# Patient Record
Sex: Female | Born: 1935
Health system: Southern US, Community
[De-identification: ages and names within clinical notes are randomized; demographics above are authoritative.]

## PROBLEM LIST (undated history)

## (undated) DIAGNOSIS — I10 Essential (primary) hypertension: Secondary | ICD-10-CM

## (undated) DIAGNOSIS — K219 Gastro-esophageal reflux disease without esophagitis: Secondary | ICD-10-CM

## (undated) DIAGNOSIS — Z923 Personal history of irradiation: Secondary | ICD-10-CM

## (undated) DIAGNOSIS — E785 Hyperlipidemia, unspecified: Secondary | ICD-10-CM

## (undated) DIAGNOSIS — R937 Abnormal findings on diagnostic imaging of other parts of musculoskeletal system: Secondary | ICD-10-CM

## (undated) DIAGNOSIS — Z853 Personal history of malignant neoplasm of breast: Secondary | ICD-10-CM

## (undated) DIAGNOSIS — R0600 Dyspnea, unspecified: Secondary | ICD-10-CM

## (undated) HISTORY — PX: CATARACT EXTRACTION: SUR2

## (undated) HISTORY — DX: Hyperlipidemia, unspecified: E78.5

## (undated) HISTORY — DX: Essential (primary) hypertension: I10

## (undated) HISTORY — PX: ABDOMINAL HYSTERECTOMY: SHX81

## (undated) HISTORY — PX: OOPHORECTOMY: SHX86

## (undated) HISTORY — DX: Gastro-esophageal reflux disease without esophagitis: K21.9

## (undated) HISTORY — DX: Abnormal findings on diagnostic imaging of other parts of musculoskeletal system: R93.7

## (undated) HISTORY — DX: Personal history of malignant neoplasm of breast: Z85.3

## (undated) HISTORY — PX: BREAST SURGERY: SHX581

---

## 1998-10-18 ENCOUNTER — Ambulatory Visit (HOSPITAL_COMMUNITY): Admission: RE | Admit: 1998-10-18 | Discharge: 1998-10-18 | Payer: Self-pay | Admitting: Emergency Medicine

## 1999-08-31 ENCOUNTER — Ambulatory Visit: Admission: RE | Admit: 1999-08-31 | Discharge: 1999-08-31 | Payer: Self-pay | Admitting: Family Medicine

## 1999-09-01 ENCOUNTER — Ambulatory Visit (HOSPITAL_COMMUNITY): Admission: RE | Admit: 1999-09-01 | Discharge: 1999-09-01 | Payer: Self-pay | Admitting: Family Medicine

## 1999-09-01 ENCOUNTER — Encounter: Payer: Self-pay | Admitting: Family Medicine

## 2003-02-02 ENCOUNTER — Ambulatory Visit (HOSPITAL_COMMUNITY): Admission: RE | Admit: 2003-02-02 | Discharge: 2003-02-02 | Payer: Self-pay | Admitting: Emergency Medicine

## 2005-02-02 ENCOUNTER — Ambulatory Visit (HOSPITAL_COMMUNITY): Admission: RE | Admit: 2005-02-02 | Discharge: 2005-02-02 | Payer: Self-pay | Admitting: *Deleted

## 2005-06-28 ENCOUNTER — Ambulatory Visit: Payer: Self-pay | Admitting: Internal Medicine

## 2005-09-17 ENCOUNTER — Ambulatory Visit: Payer: Self-pay | Admitting: Internal Medicine

## 2006-02-19 ENCOUNTER — Ambulatory Visit: Payer: Self-pay | Admitting: Internal Medicine

## 2006-07-22 ENCOUNTER — Ambulatory Visit: Payer: Self-pay | Admitting: Internal Medicine

## 2006-09-17 DIAGNOSIS — I10 Essential (primary) hypertension: Secondary | ICD-10-CM | POA: Insufficient documentation

## 2006-09-17 DIAGNOSIS — E785 Hyperlipidemia, unspecified: Secondary | ICD-10-CM

## 2006-11-19 ENCOUNTER — Ambulatory Visit: Payer: Self-pay | Admitting: Internal Medicine

## 2006-11-19 ENCOUNTER — Encounter: Payer: Self-pay | Admitting: Internal Medicine

## 2006-11-19 DIAGNOSIS — M2550 Pain in unspecified joint: Secondary | ICD-10-CM

## 2006-11-19 LAB — CONVERTED CEMR LAB
ALT: 12 units/L (ref 0–40)
BUN: 12 mg/dL (ref 6–23)
Basophils Relative: 1.2 % — ABNORMAL HIGH (ref 0.0–1.0)
Bilirubin, Direct: 0.1 mg/dL (ref 0.0–0.3)
Calcium: 8.9 mg/dL (ref 8.4–10.5)
Chloride: 107 meq/L (ref 96–112)
Cholesterol: 189 mg/dL (ref 0–200)
Creatinine, Ser: 0.8 mg/dL (ref 0.4–1.2)
Eosinophils Relative: 4.7 % (ref 0.0–5.0)
HCT: 39.6 % (ref 36.0–46.0)
HDL: 50.3 mg/dL (ref >39.0)
MCHC: 34 g/dL (ref 30.0–36.0)
MCV: 84.3 fL (ref 78.0–100.0)
Monocytes Absolute: 0.4 10*3/uL (ref 0.2–0.7)
Monocytes Relative: 6.7 % (ref 3.0–11.0)
Neutrophils Relative %: 65 % (ref 43.0–77.0)
Potassium: 3.6 meq/L (ref 3.5–5.1)
RDW: 12.9 % (ref 11.5–14.6)
Total Bilirubin: 0.9 mg/dL (ref 0.3–1.2)
Total CHOL/HDL Ratio: 3.8
Total Protein: 6.9 g/dL (ref 6.0–8.3)
Triglycerides: 93 mg/dL (ref 0–149)

## 2007-03-12 ENCOUNTER — Emergency Department (HOSPITAL_COMMUNITY): Admission: EM | Admit: 2007-03-12 | Discharge: 2007-03-12 | Payer: Self-pay | Admitting: *Deleted

## 2007-05-21 ENCOUNTER — Ambulatory Visit: Payer: Self-pay | Admitting: Internal Medicine

## 2007-09-23 ENCOUNTER — Ambulatory Visit: Payer: Self-pay | Admitting: Internal Medicine

## 2007-09-23 DIAGNOSIS — K219 Gastro-esophageal reflux disease without esophagitis: Secondary | ICD-10-CM | POA: Insufficient documentation

## 2007-10-07 ENCOUNTER — Telehealth: Payer: Self-pay | Admitting: Internal Medicine

## 2007-12-31 ENCOUNTER — Ambulatory Visit: Payer: Self-pay | Admitting: Internal Medicine

## 2008-01-01 LAB — CONVERTED CEMR LAB
Albumin: 4 g/dL (ref 3.5–5.2)
Alkaline Phosphatase: 71 units/L (ref 39–117)
CO2: 29 meq/L (ref 19–32)
Chloride: 104 meq/L (ref 96–112)
Cholesterol: 223 mg/dL (ref 0–200)
Direct LDL: 156.5 mg/dL
GFR calc Af Amer: 71 mL/min
GFR calc non Af Amer: 58 mL/min
Glucose, Bld: 112 mg/dL — ABNORMAL HIGH (ref 70–99)
Hemoglobin: 13.4 g/dL (ref 12.0–15.0)
Lymphocytes Relative: 23.9 % (ref 12.0–46.0)
MCHC: 33.4 g/dL (ref 30.0–36.0)
MCV: 84.8 fL (ref 78.0–100.0)
Monocytes Absolute: 0.5 10*3/uL (ref 0.1–1.0)
Neutrophils Relative %: 62.9 % (ref 43.0–77.0)
Platelets: 338 10*3/uL (ref 150–400)
Potassium: 3.7 meq/L (ref 3.5–5.1)
RBC: 4.74 M/uL (ref 3.87–5.11)
Sodium: 138 meq/L (ref 135–145)
Total CHOL/HDL Ratio: 4.9
Total Protein: 7.2 g/dL (ref 6.0–8.3)
Triglycerides: 97 mg/dL (ref 0–149)
WBC: 6.4 10*3/uL (ref 4.5–10.5)

## 2008-01-06 ENCOUNTER — Encounter: Admission: RE | Admit: 2008-01-06 | Discharge: 2008-01-06 | Payer: Self-pay | Admitting: Internal Medicine

## 2008-01-06 ENCOUNTER — Telehealth (INDEPENDENT_AMBULATORY_CARE_PROVIDER_SITE_OTHER): Payer: Self-pay | Admitting: *Deleted

## 2008-01-07 ENCOUNTER — Encounter (INDEPENDENT_AMBULATORY_CARE_PROVIDER_SITE_OTHER): Payer: Self-pay | Admitting: *Deleted

## 2008-01-19 ENCOUNTER — Ambulatory Visit: Payer: Self-pay | Admitting: Internal Medicine

## 2008-03-13 HISTORY — PX: CHOLECYSTECTOMY: SHX55

## 2008-04-07 ENCOUNTER — Ambulatory Visit (HOSPITAL_COMMUNITY): Admission: RE | Admit: 2008-04-07 | Discharge: 2008-04-07 | Payer: Self-pay | Admitting: *Deleted

## 2008-04-07 ENCOUNTER — Encounter (INDEPENDENT_AMBULATORY_CARE_PROVIDER_SITE_OTHER): Payer: Self-pay | Admitting: *Deleted

## 2008-05-05 ENCOUNTER — Ambulatory Visit: Payer: Self-pay | Admitting: Internal Medicine

## 2008-05-06 ENCOUNTER — Telehealth (INDEPENDENT_AMBULATORY_CARE_PROVIDER_SITE_OTHER): Payer: Self-pay | Admitting: *Deleted

## 2008-05-06 LAB — CONVERTED CEMR LAB
Cholesterol: 198 mg/dL (ref 0–200)
HDL: 39.6 mg/dL (ref >39.0)
Hgb A1c MFr Bld: 5.6 % (ref 4.6–6.0)
Triglycerides: 94 mg/dL (ref 0–149)
Vit D, 1,25-Dihydroxy: 63 (ref 30–89)

## 2008-05-07 ENCOUNTER — Encounter: Payer: Self-pay | Admitting: Internal Medicine

## 2008-05-19 ENCOUNTER — Ambulatory Visit: Payer: Self-pay | Admitting: Internal Medicine

## 2008-05-19 ENCOUNTER — Encounter (INDEPENDENT_AMBULATORY_CARE_PROVIDER_SITE_OTHER): Payer: Self-pay | Admitting: *Deleted

## 2008-05-19 LAB — CONVERTED CEMR LAB
OCCULT 2: NEGATIVE
OCCULT 3: NEGATIVE

## 2008-05-26 ENCOUNTER — Encounter: Admission: RE | Admit: 2008-05-26 | Discharge: 2008-05-26 | Payer: Self-pay | Admitting: Internal Medicine

## 2008-05-26 ENCOUNTER — Encounter: Payer: Self-pay | Admitting: Internal Medicine

## 2008-06-03 ENCOUNTER — Encounter (INDEPENDENT_AMBULATORY_CARE_PROVIDER_SITE_OTHER): Payer: Self-pay | Admitting: *Deleted

## 2008-06-09 ENCOUNTER — Encounter: Payer: Self-pay | Admitting: Internal Medicine

## 2008-06-11 ENCOUNTER — Encounter: Admission: RE | Admit: 2008-06-11 | Discharge: 2008-06-11 | Payer: Self-pay | Admitting: Internal Medicine

## 2008-06-21 ENCOUNTER — Telehealth (INDEPENDENT_AMBULATORY_CARE_PROVIDER_SITE_OTHER): Payer: Self-pay | Admitting: *Deleted

## 2008-11-03 ENCOUNTER — Ambulatory Visit: Payer: Self-pay | Admitting: Internal Medicine

## 2008-11-03 DIAGNOSIS — J309 Allergic rhinitis, unspecified: Secondary | ICD-10-CM | POA: Insufficient documentation

## 2008-11-04 DIAGNOSIS — M4850XA Collapsed vertebra, not elsewhere classified, site unspecified, initial encounter for fracture: Secondary | ICD-10-CM

## 2008-11-04 LAB — CONVERTED CEMR LAB
BUN: 18 mg/dL (ref 6–23)
Calcium: 9 mg/dL (ref 8.4–10.5)
Chloride: 105 meq/L (ref 96–112)
Glucose, Bld: 96 mg/dL (ref 70–99)
TSH: 0.79 microintl units/mL (ref 0.35–5.50)

## 2009-05-06 ENCOUNTER — Ambulatory Visit: Payer: Self-pay | Admitting: Internal Medicine

## 2009-05-06 DIAGNOSIS — R0989 Other specified symptoms and signs involving the circulatory and respiratory systems: Secondary | ICD-10-CM | POA: Insufficient documentation

## 2009-05-06 DIAGNOSIS — R0609 Other forms of dyspnea: Secondary | ICD-10-CM | POA: Insufficient documentation

## 2009-06-21 ENCOUNTER — Ambulatory Visit: Payer: Self-pay | Admitting: Gastroenterology

## 2009-07-05 ENCOUNTER — Ambulatory Visit: Payer: Self-pay | Admitting: Gastroenterology

## 2009-07-05 LAB — HM COLONOSCOPY

## 2009-07-13 ENCOUNTER — Encounter: Payer: Self-pay | Admitting: Gastroenterology

## 2009-11-04 ENCOUNTER — Ambulatory Visit: Payer: Self-pay | Admitting: Internal Medicine

## 2009-11-04 DIAGNOSIS — R209 Unspecified disturbances of skin sensation: Secondary | ICD-10-CM

## 2009-11-07 LAB — CONVERTED CEMR LAB
BUN: 22 mg/dL (ref 6–23)
CO2: 29 meq/L (ref 19–32)
Calcium: 8.6 mg/dL (ref 8.4–10.5)
Chloride: 104 meq/L (ref 96–112)
Creatinine, Ser: 1.3 mg/dL — ABNORMAL HIGH (ref 0.4–1.2)
Folate: >20 ng/mL
GFR calc non Af Amer: 42.89 mL/min (ref >60)
Glucose, Bld: 99 mg/dL (ref 70–99)
Magnesium: 2.2 mg/dL (ref 1.5–2.5)
Potassium: 4 meq/L (ref 3.5–5.1)
Sodium: 142 meq/L (ref 135–145)
Vitamin B-12: 342 pg/mL (ref 211–911)

## 2010-01-13 ENCOUNTER — Ambulatory Visit: Payer: Self-pay | Admitting: Internal Medicine

## 2010-01-18 LAB — CONVERTED CEMR LAB
BUN: 19 mg/dL (ref 6–23)
Calcium: 8.7 mg/dL (ref 8.4–10.5)
Chloride: 105 meq/L (ref 96–112)
Creatinine, Ser: 1 mg/dL (ref 0.4–1.2)
Glucose, Bld: 100 mg/dL — ABNORMAL HIGH (ref 70–99)
Potassium: 4.3 meq/L (ref 3.5–5.1)
Sodium: 141 meq/L (ref 135–145)

## 2010-05-01 ENCOUNTER — Emergency Department (HOSPITAL_COMMUNITY): Admission: EM | Admit: 2010-05-01 | Discharge: 2010-05-01 | Payer: Self-pay | Admitting: Emergency Medicine

## 2010-05-15 ENCOUNTER — Ambulatory Visit: Payer: Self-pay | Admitting: Internal Medicine

## 2010-07-12 ENCOUNTER — Ambulatory Visit: Payer: Self-pay | Admitting: Internal Medicine

## 2010-07-12 LAB — CONVERTED CEMR LAB: Vit D, 25-Hydroxy: 45 ng/mL (ref 30–89)

## 2010-07-17 LAB — CONVERTED CEMR LAB
Basophils Absolute: 0.1 10*3/uL (ref 0.0–0.1)
Basophils Relative: 1.1 % (ref 0.0–3.0)
Bilirubin, Direct: 0.1 mg/dL (ref 0.0–0.3)
CO2: 29 meq/L (ref 19–32)
Calcium: 8.7 mg/dL (ref 8.4–10.5)
Creatinine, Ser: 0.9 mg/dL (ref 0.4–1.2)
Eosinophils Absolute: 0.2 10*3/uL (ref 0.0–0.7)
Eosinophils Relative: 3.2 % (ref 0.0–5.0)
GFR calc non Af Amer: 64.6 mL/min (ref >60)
HDL: 49.4 mg/dL (ref >39.00)
Lymphs Abs: 1.8 10*3/uL (ref 0.7–4.0)
Monocytes Absolute: 0.5 10*3/uL (ref 0.1–1.0)
Neutro Abs: 4 10*3/uL (ref 1.4–7.7)
Neutrophils Relative %: 60.4 % (ref 43.0–77.0)
Sodium: 140 meq/L (ref 135–145)
Total CHOL/HDL Ratio: 4
Total Protein: 6.9 g/dL (ref 6.0–8.3)
Triglycerides: 126 mg/dL (ref 0.0–149.0)
VLDL: 25.2 mg/dL (ref 0.0–40.0)

## 2010-07-19 ENCOUNTER — Encounter
Admission: RE | Admit: 2010-07-19 | Discharge: 2010-07-19 | Payer: Self-pay | Source: Home / Self Care | Admitting: Internal Medicine

## 2010-07-19 LAB — HM MAMMOGRAPHY

## 2010-07-26 ENCOUNTER — Encounter: Payer: Self-pay | Admitting: Internal Medicine

## 2010-07-26 ENCOUNTER — Encounter
Admission: RE | Admit: 2010-07-26 | Discharge: 2010-07-26 | Payer: Self-pay | Source: Home / Self Care | Attending: Internal Medicine | Admitting: Internal Medicine

## 2010-08-13 DIAGNOSIS — Z853 Personal history of malignant neoplasm of breast: Secondary | ICD-10-CM

## 2010-08-13 HISTORY — DX: Personal history of malignant neoplasm of breast: Z85.3

## 2010-09-10 LAB — CONVERTED CEMR LAB
AST: 18 units/L (ref 0–37)
Albumin: 4 g/dL (ref 3.5–5.2)
Cholesterol: 193 mg/dL (ref 0–200)
HCT: 40.2 % (ref 36.0–46.0)
HDL: 38.6 mg/dL — ABNORMAL LOW (ref >39.00)
Hemoglobin: 13.6 g/dL (ref 12.0–15.0)
Hgb A1c MFr Bld: 5.5 % (ref 4.6–6.5)
MCHC: 33.7 g/dL (ref 30.0–36.0)
Monocytes Relative: 7.6 % (ref 3.0–12.0)
Platelets: 277 10*3/uL (ref 150.0–400.0)
RBC: 4.68 M/uL (ref 3.87–5.11)
VLDL: 21.4 mg/dL (ref 0.0–40.0)
WBC: 6.3 10*3/uL (ref 4.5–10.5)

## 2010-09-12 NOTE — Assessment & Plan Note (Signed)
Summary: 6 MTH FU/NS/KDC   Vital Signs:  Patient profile:   75 year old female Weight:      192.4 pounds Pulse rate:   84 / minute BP sitting:   130 / 80  Vitals Entered By: Shary Decamp (November 04, 2009 8:05 AM) CC: rov - fasting Comments  - rt arm pain x 3 months  - only hurts @ night, "feels like its swelling & going to explode"  - when she holds her arm to the side it feels better Shary Decamp  November 04, 2009 8:11 AM    History of Present Illness: routine office visit   Current Medications (verified): 1)  Lisinopril 20 Mg Tabs (Lisinopril) .Marland Kitchen.. 1 Tablet By Mouth Once A Day 2)  Hydrochlorothiazide 25 Mg Tabs (Hydrochlorothiazide) .... Take 1 Tablet By Mouth Every Morning 3)  Verapamil Hcl Cr 240 Mg Tbcr (Verapamil Hcl) .... Take 1 Tablet By Mouth Once A Day 4)  Adult Aspirin Low Strength 81 Mg  Tbdp (Aspirin) .Marland Kitchen.. 1 By Mouth Once Daily 5)  Calcium .Marland Kitchen.. 1 By Mouth Once Daily 6)  Alavert 10 Mg  Tabs (Loratadine) .Marland Kitchen.. 1 By Mouth Once Daily 7)  Prilosec Otc 20 Mg  Tbec (Omeprazole Magnesium) .Marland Kitchen.. 1 By Mouth Before Each Breakfast  Allergies (verified): No Known Drug Allergies  Past History:  Past Medical History: Hyperlipidemia Hypertension GERD 04-2008 TSpine XR: Mild compression deformity of the T8 vertebral body, age indeterminate.   10-09 DEXA normal  Allergic rhinitis  Past Surgical History: Reviewed history from 05/05/2008 and no changes required. Hysterectomy Oophorectomy "they left a part of one ovary" Cholecystectomy--August 2009  Social History: Reviewed history from 05/06/2009 and no changes required. Married 2 children tobacco-- quit 1974 ETOH-- no  Review of Systems       ambulatory BPs usually 120/70 6 months history of  parasthesias in the right arm.  Her arm feels swollen, there is actually no edema, symptoms are in the morning, not every day,   feels better after she shakes her arm.  Denies neck pain or rash last office visit, she complained  of decrease stamina, she just restarted walking and can't tell how her stamina is at this point. CV:  Denies swelling of feet;  . Resp:  Denies cough.  Physical Exam  General:  alert, well-developed, and well-nourished.   Lungs:  normal respiratory effort, no intercostal retractions, no accessory muscle use, and normal breath sounds.   Heart:  normal rate, regular rhythm, no murmur, and no gallop.   Extremities:  no edema Neurologic:  speech, gait, motor normal DTRs symmetric throughout   Impression & Recommendations:  Problem # 1:  PARESTHESIA (ICD-782.0)  right arm paresthesia, see review of systems,neurological exam normal observation  Orders: Venipuncture (04540) TLB-B12 + Folate Pnl (98119_14782-N56/OZH) TLB-Magnesium (Mg) (83735-MG)  Problem # 2:  DYSPNEA ON EXERTION (ICD-786.09) just restarted her daily walks, to let me know if her stamina does not go back to normal Her updated medication list for this problem includes:    Lisinopril 20 Mg Tabs (Lisinopril) .Marland Kitchen... 1 tablet by mouth once a day    Hydrochlorothiazide 25 Mg Tabs (Hydrochlorothiazide) .Marland Kitchen... Take 1 tablet by mouth every morning  Problem # 3:  HYPERTENSION (ICD-401.9)  at goal  Her updated medication list for this problem includes:    Lisinopril 20 Mg Tabs (Lisinopril) .Marland Kitchen... 1 tablet by mouth once a day    Hydrochlorothiazide 25 Mg Tabs (Hydrochlorothiazide) .Marland Kitchen... Take 1 tablet by mouth every morning  Verapamil Hcl Cr 240 Mg Tbcr (Verapamil hcl) .Marland Kitchen... Take 1 tablet by mouth once a day    BP today: 130/80 Prior BP: 132/60 (05/06/2009)  Prior 10 Yr Risk Heart Disease: 13 % (09/23/2007)  Labs Reviewed: K+: 3.8 (11/03/2008) Creat: : 0.9 (11/03/2008)   Chol: 193 (05/06/2009)   HDL: 38.60 (05/06/2009)   LDL: 133 (05/06/2009)   TG: 107.0 (05/06/2009)  Orders: TLB-BMP (Basic Metabolic Panel-BMET) (80048-METABOL)  Complete Medication List: 1)  Lisinopril 20 Mg Tabs (Lisinopril) .Marland Kitchen.. 1 tablet by mouth  once a day 2)  Hydrochlorothiazide 25 Mg Tabs (Hydrochlorothiazide) .... Take 1 tablet by mouth every morning 3)  Verapamil Hcl Cr 240 Mg Tbcr (Verapamil hcl) .... Take 1 tablet by mouth once a day 4)  Adult Aspirin Low Strength 81 Mg Tbdp (Aspirin) .Marland Kitchen.. 1 by mouth once daily 5)  Calcium  .Marland KitchenMarland Kitchen. 1 by mouth once daily 6)  Alavert 10 Mg Tabs (Loratadine) .Marland Kitchen.. 1 by mouth once daily 7)  Prilosec Otc 20 Mg Tbec (Omeprazole magnesium) .Marland Kitchen.. 1 by mouth before each breakfast  Patient Instructions: 1)  Please schedule a follow-up appointment in 6 months . (yearly)

## 2010-09-12 NOTE — Assessment & Plan Note (Signed)
Summary: FOR BACK PAIN//PH   Vital Signs:  Patient profile:   75 year old female Weight:      192.38 pounds Pulse rate:   105 / minute Pulse rhythm:   regular BP sitting:   122 / 82  (left arm) Cuff size:   regular  Vitals Entered By: Army Fossa CMA (May 15, 2010 3:57 PM)  CC: Pt here for f/u on Back Pain- was seen in ER 2 weeks ago.  Comments Out of all pain meds. Still experiencing some pain    History of Present Illness: bilateral low back pain that started at the end of a 2 week vacation 4 days After the onset of pain, the symptoms were very intense  and she ended up the ER.  ER records reviewed,  A x-ray was done on  May 01, 2010:    1.  No evidence of acute fracture or subluxation.   2.  Mildly worsened loss of height with respect to the previously   noted compression fracture of L1.   3.  Mild compression deformity of the L3 superior endplate again   noted.  She was prescribed Vicodin, prednisone, Flexeril At this point the patient is gradually improving, currently located only at the left lower back. no pain radiation  ROS Denies fevers No bladder or bowel incontinence No lower extremity tingling No injury or over doing even during her vacation.  Current Medications (verified): 1)  Lisinopril 20 Mg Tabs (Lisinopril) .Marland Kitchen.. 1 Tablet By Mouth Once A Day 2)  Hydrochlorothiazide 25 Mg Tabs (Hydrochlorothiazide) .... Take 1 Tablet By Mouth Every Morning 3)  Verapamil Hcl Cr 240 Mg Tbcr (Verapamil Hcl) .... Take 1 Tablet By Mouth Once A Day 4)  Adult Aspirin Low Strength 81 Mg  Tbdp (Aspirin) .Marland Kitchen.. 1 By Mouth Once Daily 5)  Calcium .Marland Kitchen.. 1 By Mouth Once Daily 6)  Alavert 10 Mg  Tabs (Loratadine) .Marland Kitchen.. 1 By Mouth Once Daily 7)  Prilosec Otc 20 Mg  Tbec (Omeprazole Magnesium) .Marland Kitchen.. 1 By Mouth Before Each Breakfast  Allergies (verified): No Known Drug Allergies  Past History:  Past Medical History: Reviewed history from 11/04/2009 and no changes  required. Hyperlipidemia Hypertension GERD 04-2008 TSpine XR: Mild compression deformity of the T8 vertebral body, age indeterminate.   10-09 DEXA normal  Allergic rhinitis  Past Surgical History: Reviewed history from 05/05/2008 and no changes required. Hysterectomy Oophorectomy "they left a part of one ovary" Cholecystectomy--August 2009  Social History: Reviewed history from 05/06/2009 and no changes required. Married 2 children tobacco-- quit 1974 ETOH-- no  Physical Exam  General:  alert and well-developed.   Msk:  she is using a back brace which she removed without difficulties. back is nontender to palpation. she has had some mild antalgic position when she lays down in the table to be examined Extremities:  no edema Neurologic:  alert & oriented X3, strength normal in all extremities, and DTRs symmetrical and normal.  straight leg test negative   Impression & Recommendations:  Problem # 1:  BACK PAIN (ICD-724.5) Assessment New acute back pain improving with conservative treatment. Neurological exam normal the x-rays showed a mildly worsened loss of height with respect to the previously noted compression fracture of L1. and  a mild compression deformity of the L3 superior endplate  ( no new). Unclear but unlikelythat pain is related to the  old fractures. Plan: Conservative treatment, see instructions If no better, she will be referred to neurosurgery  Her updated  medication list for this problem includes:    Adult Aspirin Low Strength 81 Mg Tbdp (Aspirin) .Marland Kitchen... 1 by mouth once daily    Cyclobenzaprine Hcl 10 Mg Tabs (Cyclobenzaprine hcl) ..... One by mouth twice a day as needed for pain    Vicodin 5-500 Mg Tabs (Hydrocodone-acetaminophen) .Marland Kitchen... 1 or 2 by mouth every 6 hours as needed for pain  Problem # 2:  time spent with patient 25 minutes, a lot of time spent reviewing the ER records  Complete Medication List: 1)  Lisinopril 20 Mg Tabs (Lisinopril) .Marland Kitchen.. 1  tablet by mouth once a day 2)  Hydrochlorothiazide 25 Mg Tabs (Hydrochlorothiazide) .... Take 1 tablet by mouth every morning 3)  Verapamil Hcl Cr 240 Mg Tbcr (Verapamil hcl) .... Take 1 tablet by mouth once a day 4)  Adult Aspirin Low Strength 81 Mg Tbdp (Aspirin) .Marland Kitchen.. 1 by mouth once daily 5)  Calcium  .Marland KitchenMarland Kitchen. 1 by mouth once daily 6)  Alavert 10 Mg Tabs (Loratadine) .Marland Kitchen.. 1 by mouth once daily 7)  Prilosec Otc 20 Mg Tbec (Omeprazole magnesium) .Marland Kitchen.. 1 by mouth before each breakfast 8)  Cyclobenzaprine Hcl 10 Mg Tabs (Cyclobenzaprine hcl) .... One by mouth twice a day as needed for pain 9)  Vicodin 5-500 Mg Tabs (Hydrocodone-acetaminophen) .Marland Kitchen.. 1 or 2 by mouth every 6 hours as needed for pain  Patient Instructions: 1)  rest 2)  Warm compresses 3)  CYCLOBENZAPRINE  10 mg (muscle relaxant) one twice a day as needed for pain, will cause drowsiness 4)  hydrocodone as needed for pain, will cause drowsiness 5)  Call if not better in 2-3 weeks Prescriptions: VERAPAMIL HCL CR 240 MG TBCR (VERAPAMIL HCL) Take 1 tablet by mouth once a day  #90 x 2   Entered by:   Army Fossa CMA   Authorized by:   Nolon Rod. Paz MD   Signed by:   Army Fossa CMA on 05/15/2010   Method used:   Faxed to ...       [facility]* (mail-order)       [address]       [place], [place]  [address]       Ph: [phone]       Fax: [phone]   RxID:   [id number] HYDROCHLOROTHIAZIDE 25 MG TABS (HYDROCHLOROTHIAZIDE) Take 1 tablet by mouth every morning  #90 x 2   Entered by:   Army Fossa CMA   Authorized by:   Nolon Rod. Paz MD   Signed by:   Army Fossa CMA on 05/15/2010   Method used:   Faxed to ...       [facility]* (mail-order)       [address]       [place], [place]  [address]       Ph: [phone]       Fax: [phone]   RxID:   [id number] LISINOPRIL 20 MG TABS (LISINOPRIL) 1 tablet by mouth once a day  #90 x 2   Entered by:   Army Fossa  CMA   Authorized by:   Nolon Rod. Paz MD   Signed by:   Army Fossa CMA on 05/15/2010   Method used:   Faxed to ...       [facility]* (mail-order)       [address]       [place], [place]  [address]       Ph: [phone]  Fax: [phone]   RxID:   [id number] VICODIN 5-500 MG TABS (HYDROCODONE-ACETAMINOPHEN) 1 or 2 by mouth every 6 hours as needed for pain  #40 x 0   Entered and Authorized by:   Nolon Rod. Paz MD   Signed by:   Nolon Rod. Paz MD on 05/15/2010   Method used:   Print then Give to Patient   RxID:   [id number] CYCLOBENZAPRINE HCL 10 MG TABS (CYCLOBENZAPRINE HCL) one by mouth twice a day as needed for pain  #40 x 0   Entered and Authorized by:   Nolon Rod. Paz MD   Signed by:   Nolon Rod. Paz MD on 05/15/2010   Method used:   Print then Give to Patient   RxID:   [id number]

## 2010-09-12 NOTE — Assessment & Plan Note (Signed)
Summary: cpx and fastin glabs///sph   Vital Signs:  Patient profile:   75 year old female Height:      67.5 inches Weight:      193.38 pounds BMI:     29.95 Pulse rate:   84 / minute Pulse rhythm:   regular BP sitting:   124 / 76  (left arm) Cuff size:   regular  Vitals Entered By: Army Fossa CMA (July 12, 2010 8:47 AM) CC: CPX, fasting  Comments sams pharm/prescription solutions not had nor wants mammo or pap, unsure of bone density   History of Present Illness: Here for Medicare AWV: 1.   Risk factors based on Past M, S, F history: reviewed  2.   Physical Activities: walks frecuently, yard work, active  3.   Depression/mood: no major problems, no problems noted  4.   Hearing: no problems noted to normal conversation  5.   ADL's: totally independent  6.   Fall Risk: low risk, no h/o falls  7.   Home Safety: feels safe at home  8.   Height, weight, &visual acuity: see VS, will have her routine eye check this week  9.   Counseling: yes, se below  10.   Labs ordered based on risk factors:  11.           Referral Coordination, if needed  12.           Care Plan, see a/p  13.            Cognitive Assessment: motor skills , cognition and memory seem appropiate   in addition we've discussed the following issues sinus congestion on-off lately , no fever, no itchy eyes or nose. She mostly has PN dripping  Hypertension-- good medication compliance , infrecuent checks of  ambulatory BPs but when checked are wnl  GERD -- nearly asx  recent back pain-- improved    Preventive Screening-Counseling & Management  Caffeine-Diet-Exercise     Does Patient Exercise: yes     Type of exercise: walking   Current Medications (verified): 1)  Lisinopril 20 Mg Tabs (Lisinopril) .Marland Kitchen.. 1 Tablet By Mouth Once A Day 2)  Hydrochlorothiazide 25 Mg Tabs (Hydrochlorothiazide) .... Take 1 Tablet By Mouth Every Morning 3)  Verapamil Hcl Cr 240 Mg Tbcr (Verapamil Hcl) .... Take 1 Tablet By  Mouth Once A Day 4)  Adult Aspirin Low Strength 81 Mg  Tbdp (Aspirin) .Marland Kitchen.. 1 By Mouth Once Daily 5)  Calcium .Marland Kitchen.. 1 By Mouth Once Daily 6)  Alavert 10 Mg  Tabs (Loratadine) .Marland Kitchen.. 1 By Mouth Once Daily 7)  Prilosec Otc 20 Mg  Tbec (Omeprazole Magnesium) .Marland Kitchen.. 1 By Mouth Before Each Breakfast  Allergies (verified): No Known Drug Allergies  Past History:  Past Medical History: Hyperlipidemia Hypertension GERD 04-2008 TSpine XR: Mild compression deformity of the T8 vertebral body, age indeterminate.   10-09 DEXA normal  Allergic rhinitis  Past Surgical History: Reviewed history from 05/05/2008 and no changes required. Hysterectomy Oophorectomy "they left a part of one ovary" Cholecystectomy--August 2009  Family History: Reviewed history from 09/23/2007 and no changes required. colon Ca - no breast Ca - no stroke - no CAD - F HTN - no DM - no  Social History: Married 2 children tobacco-- quit 1974 ETOH-- no diet-- describes her diet as healthy   Does Patient Exercise:  yes  Review of Systems CV:  Denies chest pain or discomfort; occasionally ankle swelling . Resp:  Denies cough and wheezing;  occasionally DOE if walking uphill, symptoms not increasing in the last years . GI:  Denies diarrhea, nausea, and vomiting. GU:  Denies discharge and hematuria; does not do  SBE.  Physical Exam  General:  alert and well-developed.   Head:  face symmetric, very mild tenderness to palpation of the maxillary sinuses Ears:  R ear normal and L ear normal.   Nose:  slightly congested Mouth:  no redness or discharge Neck:  no masses, no thyromegaly, and normal carotid upstroke.   Breasts:  left breast exam seems normal Right breast exam, at around 1 oclock the breast tissue since more dense,?oval induration  ~ 1.5cm. no axilary LADs Lungs:  normal respiratory effort, no intercostal retractions, no accessory muscle use, and normal breath sounds.   Heart:  normal rate, regular rhythm,  no murmur, and no gallop.   Abdomen:  soft, non-tender, no distention, no masses, no guarding, and no rigidity.   Extremities:  no edema   Impression & Recommendations:  Problem # 1:  HEALTH SCREENING (ICD-V70.0) Td 10-08 flu shot today  pneumonia shot--2007 shingles immunization? reports she had shingles at age 69 and in the 59s . Offered inmunization    last PAP years ago, no h/o abnormal PAPs, s/p hysterectomy due to bleeding :rec. PAP if so desire  mammogram   last report available is from 2009 question of a right breast induration, see physical exam Will refer her for mammogram and ultrasound   last colonoscopy 06/2009, negative, next 10 years  bone density  2006 and  05/2008 normal, refer for another bone density test, see #2  diet- exercise discussed  Orders: Radiology Referral (Radiology) Medicare -1st Annual Wellness Visit [phone])  Problem # 2:  COMPRESSION FRACTURE, THORACIC VERTEBRA (ICD-805.2) see note from 10-11 pain improved We'll check a vitamin D and a bone density test  Orders: T-Vitamin D (25-Hydroxy) (32440-10272) Specimen Handling (53664) Radiology Referral (Radiology)  Problem # 3:  ALLERGIC RHINITIS (ICD-477.9) postnasal dripping likely from allergies. Flonase Her updated medication list for this problem includes:    Alavert 10 Mg Tabs (Loratadine) .Marland Kitchen... 1 by mouth once daily    Flonase 50 Mcg/act Susp (Fluticasone propionate) .Marland Kitchen... 2 sprays on each side of the nose once daily  Problem # 4:  HYPERTENSION (ICD-401.9) at goal  Her updated medication list for this problem includes:    Lisinopril 20 Mg Tabs (Lisinopril) .Marland Kitchen... 1 tablet by mouth once a day    Hydrochlorothiazide 25 Mg Tabs (Hydrochlorothiazide) .Marland Kitchen... Take 1 tablet by mouth every morning    Verapamil Hcl Cr 240 Mg Tbcr (Verapamil hcl) .Marland Kitchen... Take 1 tablet by mouth once a day    BP today: 124/76 Prior BP: 122/82 (05/15/2010)  Prior 10 Yr Risk Heart Disease: 13 %  (09/23/2007)  Labs Reviewed: K+: 4.3 (01/13/2010) Creat: : 1.0 (01/13/2010)   Chol: 193 (05/06/2009)   HDL: 38.60 (05/06/2009)   LDL: 133 (05/06/2009)   TG: 107.0 (05/06/2009)  Orders: Venipuncture (40347) TLB-BMP (Basic Metabolic Panel-BMET) (80048-METABOL) TLB-Hepatic/Liver Function Pnl (80076-HEPATIC) TLB-CBC Platelet - w/Differential (85025-CBCD) Specimen Handling (42595)  Problem # 5:  HYPERLIPIDEMIA (ICD-272.4) on diet only Orders: TLB-Lipid Panel (80061-LIPID) Specimen Handling (63875)  Problem # 6:  ? of DIABETES MELLITUS, BORDERLINE (ICD-790.29) A1c  consistently below 6, Recheck  Orders: TLB-A1C / Hgb A1C (Glycohemoglobin) (83036-A1C) Specimen Handling (64332)  Complete Medication List: 1)  Lisinopril 20 Mg Tabs (Lisinopril) .Marland Kitchen.. 1 tablet by mouth once a day 2)  Hydrochlorothiazide 25 Mg Tabs (Hydrochlorothiazide) .... Take  1 tablet by mouth every morning 3)  Verapamil Hcl Cr 240 Mg Tbcr (Verapamil hcl) .... Take 1 tablet by mouth once a day 4)  Adult Aspirin Low Strength 81 Mg Tbdp (Aspirin) .Marland Kitchen.. 1 by mouth once daily 5)  Calcium  .Marland KitchenMarland Kitchen. 1 by mouth once daily 6)  Alavert 10 Mg Tabs (Loratadine) .Marland Kitchen.. 1 by mouth once daily 7)  Prilosec Otc 20 Mg Tbec (Omeprazole magnesium) .Marland Kitchen.. 1 by mouth before each breakfast 8)  Flonase 50 Mcg/act Susp (Fluticasone propionate) .... 2 sprays on each side of the nose once daily  Other Orders: Flu Vaccine 84yrs + MEDICARE PATIENTS (P7106) Administration Flu vaccine - MCR (Y6948)  Patient Instructions: 1)  Please schedule a follow-up appointment in 6 months .  Prescriptions: FLONASE 50 MCG/ACT SUSP (FLUTICASONE PROPIONATE) 2 sprays on each side of the nose once daily  #1 x 6   Entered and Authorized by:   Elita Quick E. Kinzy Weyers MD   Signed by:   Nolon Rod. Clancy Mullarkey MD on 07/12/2010   Method used:   Print then Give to Patient   RxID:   [id number]    Orders Added: 1)  Flu Vaccine 46yrs + MEDICARE PATIENTS [Q2039] 2)  Administration Flu  vaccine - MCR [G0008] 3)  Venipuncture [36415] 4)  TLB-BMP (Basic Metabolic Panel-BMET) [80048-METABOL] 5)  TLB-Hepatic/Liver Function Pnl [80076-HEPATIC] 6)  TLB-CBC Platelet - w/Differential [85025-CBCD] 7)  TLB-Lipid Panel [80061-LIPID] 8)  T-Vitamin D (25-Hydroxy) [99371-69678] 9)  TLB-A1C / Hgb A1C (Glycohemoglobin) [83036-A1C] 10)  Specimen Handling [99000] 11)  Radiology Referral [Radiology] 12)  Radiology Referral [Radiology] 13)  Est. Patient Level III [93810] 14)  Medicare -1st Annual Wellness Visit [G0438]   Flu Vaccine Consent Questions     Do you have a history of severe allergic reactions to this vaccine? no    Any prior history of allergic reactions to egg and/or gelatin? no    Do you have a sensitivity to the preservative Thimersol? no    Do you have a past history of Guillan-Barre Syndrome? no    Do you currently have an acute febrile illness? no    Have you ever had a severe reaction to latex? no    Vaccine information given and explained to patient? yes    Are you currently pregnant? no    Lot Number:[id number]   Exp Date:02/10/2011   Site Given  Right Deltoid IM  Risk Factors:  Exercise:  yes    Type:  walking   .[email or web address]

## 2010-12-26 NOTE — Op Note (Signed)
Doris Mcdaniel, Doris Mcdaniel            ACCOUNT NO.:  [id number]   MEDICAL RECORD NO.:  [id number]          PATIENT TYPE:  AMB   LOCATION:  DAY                          FACILITY:  [facility]   PHYSICIAN:  Alfonse Ras, MD   DATE OF BIRTH:  [birth year]   DATE OF PROCEDURE:  04/07/2008  DATE OF DISCHARGE:                               OPERATIVE REPORT   PREOPERATIVE DIAGNOSIS:  Symptomatic cholelithiasis.   POSTOPERATIVE DIAGNOSES:  Symptomatic cholelithiasis, ascites, normal  cholangiogram.   PROCEDURE:  Laparoscopic cholecystectomy with intraoperative  cholangiogram and aspiration of ascites sent for cytology.   SURGEON:  Alfonse Ras, MD   ASSISTANT:  Anselm Pancoast. Zachery Dakins, M.D.   ANESTHESIA:  General.   DESCRIPTION OF PROCEDURE:  The patient was taken to the operating room,  placed in spine position.  After adequate general anesthesia was induced  using endotracheal tube, the abdomen was prepped and draped in normal  sterile fashion.  Using a transverse infraumbilical incision I dissected  down to fascia.  Fascia was opened vertically.  An O Vicryl pursestring  suture was placed on the fascial defect.  Hassan trocar was placed in  the abdomen and pneumoperitoneum was obtained.  An 11 mm trocar was  placed in the in the subxiphoid region, two 5 mm trocars were placed in  the right abdomen.  Gallbladder was mildly distended and somewhat tense,  but showed no evidence of inflammation.  There was a few 100 cc of  ascites over the liver and in the pelvis and this was aspirated and sent  for cytologic evaluation.  The patient had a previous hysterectomy, a  total abdominal hysterectomy by the patient's report back in 1980.  The  fluid did not look infected but it was sent for cytologic evaluation.  Gallbladder was retracted cephalad and dissecting down on the neck, a  critical view of the cystic duct was easily created and the cystic duct  was clipped up at the gallbladder.  Small  ductotomy was made.  Reddick  catheter was introduced in the cystic duct and cholangiogram was  performed which showed no filling defects, free flow into the duodenum,  and normal filling of the right and left hepatic ducts.  Cholangiocatheter was removed.  Cystic duct was triply clipped and  divided.  Cystic artery was identified, critical view was obtained.  It  was triply clipped and divided.  Gallbladder was taken off the  gallbladder bed using Bovie electrocautery and placed in EndoCatch bag.  It was removed from the umbilical port.  Adequate hemostasis was  ensured.  The right upper quadrant was copiously irrigated.  Pneumoperitoneum was released.  The infraumbilical fascial defect was  closed with the 0 Vicryl pursestring suture.  Skin incisions were closed  with subcuticular 4-0 Monocryl.  Steri-Strips and dressings were  applied.  The patient tolerated the procedure well and went to PACU in  good condition.     Alfonse Ras, MD  Electronically Signed    KRE/MEDQ  D:  04/07/2008  T:  04/08/2008  Job:  [id number]

## 2010-12-29 NOTE — Assessment & Plan Note (Signed)
[facility]                                 ON-CALL NOTE   NAME:WILLIAMSONDestin, Doris Mcdaniel                     MRN:          [id number]  DATE:10/06/2007                            DOB:          [birth year]    Patient of Dr. Drue Novel.  Phone number [phone].  Patient has had some  vomiting today.  Onset of nausea this afternoon.  Did not feel that  great yesterday, and has been vomiting up yellow-looking fluid that  looks like orange juice, although she did not have any.  She has no  fever, but did feel cold, then hot and sweaty.  She denies any abdominal  pain, but after the recurrent vomiting, she describes a mid chest  soreness that is burning in nature and radiates without associated  shortness of breath.  It is persistent.  She has no diarrhea.  She does  have lots of burping.  She is generally well, but has been on treatment  for high blood pressure with verapamil and benazepril diuretic.  She  apparently saw Dr. Drue Novel last week, and was placed on Prilosec once a day  for some symptoms.  Discussed with patient that she can try an extra  Prilosec, however, if she is having recurrent vomiting, dehydration, or  any character of her chest pain or worse shortness of breath, she should  seek care in the emergency room.  Otherwise, she will be seen tomorrow.     Neta Mends. Panosh, MD  Electronically Signed    WKP/MedQ  DD: 10/06/2007  DT: 10/07/2007  Job #: [id number]

## 2010-12-29 NOTE — Op Note (Signed)
NAME:  Doris Mcdaniel, Doris Mcdaniel NO.:  [id number]   MEDICAL RECORD NO.:  [id number]          PATIENT TYPE:  AMB   LOCATION:  ENDO                         FACILITY:  [facility]   PHYSICIAN:  Georgiana Spinner, M.D.    DATE OF BIRTH:  1936-01-22   DATE OF PROCEDURE:  02/02/2005  DATE OF DISCHARGE:                                 OPERATIVE REPORT   PROCEDURE:  Colonoscopy.   INDICATIONS:  Colon cancer screening.   ANESTHESIA:  Demerol 50, Versed 4 mg.   DESCRIPTION OF PROCEDURE:  With the patient mildly sedated in the left  lateral decubitus position, the Olympus videoscopic colonoscope was inserted  into the rectum and passed under direct vision to the cecum identified by  ileocecal valve and appendiceal orifice. In the cecum, the prep was slightly  suboptimal in that there was sticky tenacious stool material that we washed  and suctioned as best we could but it was fairly adherent to the colonic  mucosa and did not wash off very well. From this point after doing this as  good as we could, the endoscope was slowly withdrawn taking circumferential  views of the colonic mucosa stopping in the rectum which appeared normal on  direct and showed hemorrhoids on retroflexed view. The endoscope was  straightened and withdrawn. The patient's vital signs and pulse oximeter  remained stable. The patient tolerated the procedure well without apparent  complication.   FINDINGS:  Internal hemorrhoids otherwise an unremarkable examination  limited somewhat by prep in the right colon.   PLAN:  Consider repeat examination in a few years.       GMO/MEDQ  D:  02/02/2005  T:  02/02/2005  Job:  [id number]   cc:   Brett Canales A. Cleta Alberts, M.D.  [address]  [place]  [place] [address]  Fax: [phone]

## 2010-12-29 NOTE — Assessment & Plan Note (Signed)
[facility]                                   ON-CALL NOTE   NAME:WILLIAMSONSamai, Corea                     MRN:          [id number]  DATE:06/29/2006                            DOB:          1936-04-23    TELEPHONE DICTATION:  The phone call comes from the patient's husband,  Chrissie Noa, at [phone]. He called at 3:35 p.m. Mrs. Mines is treated for  high blood pressure. Does not know what she is on. He is afraid that her  blood pressure was up high because her face was kind of flushed. He did take  her blood pressure recently and it was 117/51. She feels okay now.   PLAN:  I told them that the blood pressure is fine. He actually never did  check her blood pressure when her face was flushed. I told her that I do not  really know for sure if it was high or not. He is going to keep an eye on  her. I did not think there was any reason for evaluation unless she feels  bad or her blood pressure really is high.     Karie Schwalbe, MD  Electronically Signed    RIL/MedQ  DD: 06/29/2006  DT: 06/29/2006  Job #: [id number]

## 2011-01-11 ENCOUNTER — Encounter: Payer: Self-pay | Admitting: Internal Medicine

## 2011-01-12 ENCOUNTER — Ambulatory Visit (INDEPENDENT_AMBULATORY_CARE_PROVIDER_SITE_OTHER): Payer: Medicare Other | Admitting: Internal Medicine

## 2011-01-12 ENCOUNTER — Encounter: Payer: Self-pay | Admitting: Internal Medicine

## 2011-01-12 DIAGNOSIS — Z0000 Encounter for general adult medical examination without abnormal findings: Secondary | ICD-10-CM | POA: Insufficient documentation

## 2011-01-12 DIAGNOSIS — K047 Periapical abscess without sinus: Secondary | ICD-10-CM

## 2011-01-12 DIAGNOSIS — K044 Acute apical periodontitis of pulpal origin: Secondary | ICD-10-CM

## 2011-01-12 DIAGNOSIS — S22009A Unspecified fracture of unspecified thoracic vertebra, initial encounter for closed fracture: Secondary | ICD-10-CM

## 2011-01-12 DIAGNOSIS — I10 Essential (primary) hypertension: Secondary | ICD-10-CM

## 2011-01-12 MED ORDER — HYDROCODONE-ACETAMINOPHEN 7.5-750 MG PO TABS: 1.0000 | ORAL_TABLET | Freq: Four times a day (QID) | ORAL | Status: DC | PRN

## 2011-01-12 MED ORDER — VERAPAMIL HCL 240 MG PO TBCR: 240.0000 mg | EXTENDED_RELEASE_TABLET | Freq: Every day | ORAL | Status: DC

## 2011-01-12 MED ORDER — LORATADINE 10 MG PO TABS: 10.0000 mg | ORAL_TABLET | Freq: Every day | ORAL | Status: DC

## 2011-01-12 MED ORDER — AMOXICILLIN 500 MG PO CAPS: 1000.0000 mg | ORAL_CAPSULE | Freq: Two times a day (BID) | ORAL | Status: DC

## 2011-01-12 MED ORDER — OMEPRAZOLE 20 MG PO CPDR: 20.0000 mg | DELAYED_RELEASE_CAPSULE | Freq: Every day | ORAL | Status: DC

## 2011-01-12 MED ORDER — LISINOPRIL 20 MG PO TABS: 20.0000 mg | ORAL_TABLET | Freq: Every day | ORAL | Status: DC

## 2011-01-12 MED ORDER — FLUTICASONE PROPIONATE 50 MCG/ACT NA SUSP: 2.0000 | Freq: Every day | NASAL | Status: DC

## 2011-01-12 MED ORDER — AMOXICILLIN 500 MG PO CAPS: 1000.0000 mg | ORAL_CAPSULE | Freq: Two times a day (BID) | ORAL | Status: AC

## 2011-01-12 MED ORDER — HYDROCHLOROTHIAZIDE 25 MG PO TABS: 25.0000 mg | ORAL_TABLET | Freq: Every day | ORAL | Status: DC

## 2011-01-12 NOTE — Assessment & Plan Note (Addendum)
See CPX from 6 months ago, breast exam was abnormal. Breast exam today is within normal. I still recommend her to have the followup mammogram and ultrasound of the right breast. Will set it up for her

## 2011-01-12 NOTE — Patient Instructions (Signed)
Amoxicillin and Vicodin for treatment of the dental infection. Vicodin will cause  drowsiness. See the dentist ASAP.

## 2011-01-12 NOTE — Assessment & Plan Note (Signed)
See physical exam Amoxicillin, Vicodin (patient reports over-the-counter not helping with the pain enough) Advised patient to see her dentist ASAP

## 2011-01-12 NOTE — Assessment & Plan Note (Signed)
at goal

## 2011-01-12 NOTE — Progress Notes (Signed)
  Subjective:    Patient ID: Doris Mcdaniel, female    DOB: Dec 24, 1935, 75 y.o.   MRN: [id number]  HPI  Routine office visit Started with a tooth ache yesterday, at the  anterior and lower area. Mild swelling.  Past Medical History  Diagnosis Date  . Hyperlipidemia   . Hypertension   . GERD (gastroesophageal reflux disease)   . Abnormal x-ray of spine     Mild Compression deformity of the the T8 vertebral body, age indertiminate   . Allergic rhinitis    Past Surgical History  Procedure Date  . Abdominal hysterectomy   . Oophorectomy     they left a part of the ovarly  . Cholecystectomy 03/2008    Review of Systems  was prescribed Fosamax, did not start because her dentist told her it would "mess up the jaw bone"; unable to tolerate calcium due to constipation She is due for a mammogram - ultrasound, already got a letter  from radiology. Good medication compliance with blood pressure medicines, good ambulatory BP readings    Objective:   Physical Exam  Constitutional: She appears well-developed and well-nourished.  HENT:       At the anterior lower jaw she has the remaining of a tooth in  very bad condition, extremely tender to touch, no swelling appreciated  Genitourinary:       Rest exam today without dominant masses on either breast, no axillary lymphadenopathy.           Assessment & Plan:

## 2011-01-12 NOTE — Assessment & Plan Note (Addendum)
History of compression fracture, bone density test 07/2010---> normal. Based on the fracture, I recommended treatment. She decided not to take Fosamax, see history of present illness. In fact she said she won't take any medication until her dentist finish his work. Intolerant to calcium due  to constipation. Plan: Extensive educational material provided in reference to osteoporosis from "[employer]" including treatment options Exercise and vitamin D recommended.

## 2011-01-13 ENCOUNTER — Telehealth: Payer: Self-pay | Admitting: Internal Medicine

## 2011-01-13 DIAGNOSIS — R928 Other abnormal and inconclusive findings on diagnostic imaging of breast: Secondary | ICD-10-CM

## 2011-01-13 NOTE — Telephone Encounter (Signed)
please schedule a: diagnostic right MMG and breast ultrasound DX followup abnormal mammogram

## 2011-01-19 ENCOUNTER — Other Ambulatory Visit: Payer: Self-pay | Admitting: Internal Medicine

## 2011-01-19 ENCOUNTER — Ambulatory Visit
Admission: RE | Admit: 2011-01-19 | Discharge: 2011-01-19 | Disposition: A | Discharge status: Home / Self Care | Payer: Medicare Other | Source: Ambulatory Visit | Attending: Internal Medicine | Admitting: Internal Medicine

## 2011-01-19 DIAGNOSIS — R928 Other abnormal and inconclusive findings on diagnostic imaging of breast: Secondary | ICD-10-CM

## 2011-01-19 DIAGNOSIS — R921 Mammographic calcification found on diagnostic imaging of breast: Secondary | ICD-10-CM

## 2011-01-21 NOTE — Telephone Encounter (Signed)
Correction: LEFT MMG - [place]

## 2011-01-24 ENCOUNTER — Other Ambulatory Visit: Payer: Self-pay | Admitting: Diagnostic Radiology

## 2011-01-24 ENCOUNTER — Ambulatory Visit
Admission: RE | Admit: 2011-01-24 | Discharge: 2011-01-24 | Disposition: A | Discharge status: Home / Self Care | Payer: Medicare Other | Source: Ambulatory Visit | Attending: Internal Medicine | Admitting: Internal Medicine

## 2011-01-24 DIAGNOSIS — R921 Mammographic calcification found on diagnostic imaging of breast: Secondary | ICD-10-CM

## 2011-01-24 HISTORY — PX: BREAST BIOPSY: SHX20

## 2011-01-25 ENCOUNTER — Other Ambulatory Visit: Payer: Self-pay | Admitting: Internal Medicine

## 2011-01-25 DIAGNOSIS — C50912 Malignant neoplasm of unspecified site of left female breast: Secondary | ICD-10-CM

## 2011-01-25 DIAGNOSIS — N6489 Other specified disorders of breast: Secondary | ICD-10-CM

## 2011-01-26 ENCOUNTER — Ambulatory Visit
Admission: RE | Admit: 2011-01-26 | Discharge: 2011-01-26 | Disposition: A | Discharge status: Home / Self Care | Payer: Medicare Other | Source: Ambulatory Visit | Attending: Internal Medicine | Admitting: Internal Medicine

## 2011-01-26 DIAGNOSIS — N6489 Other specified disorders of breast: Secondary | ICD-10-CM

## 2011-01-29 ENCOUNTER — Ambulatory Visit
Admission: RE | Admit: 2011-01-29 | Discharge: 2011-01-29 | Disposition: A | Discharge status: Home / Self Care | Payer: Medicare Other | Source: Ambulatory Visit | Attending: Internal Medicine | Admitting: Internal Medicine

## 2011-01-29 DIAGNOSIS — C50912 Malignant neoplasm of unspecified site of left female breast: Secondary | ICD-10-CM

## 2011-01-29 MED ORDER — GADOBENATE DIMEGLUMINE 529 MG/ML IV SOLN: 17.0000 mL | Freq: Once | INTRAVENOUS | Status: AC | PRN

## 2011-02-01 ENCOUNTER — Other Ambulatory Visit (INDEPENDENT_AMBULATORY_CARE_PROVIDER_SITE_OTHER): Payer: Self-pay | Admitting: General Surgery

## 2011-02-01 DIAGNOSIS — C50912 Malignant neoplasm of unspecified site of left female breast: Secondary | ICD-10-CM

## 2011-02-06 ENCOUNTER — Telehealth: Payer: Self-pay | Admitting: Internal Medicine

## 2011-02-06 NOTE — Telephone Encounter (Signed)
LMOM, called to check on pt

## 2011-02-06 NOTE — Telephone Encounter (Signed)
Recently diagnosed with breast cancer. Obviously stressed about his situation, I told her I am very confident that she will get excellent care here in [place]. She will call if she has concerns or questions.

## 2011-02-20 ENCOUNTER — Encounter (HOSPITAL_BASED_OUTPATIENT_CLINIC_OR_DEPARTMENT_OTHER)
Admission: RE | Admit: 2011-02-20 | Discharge: 2011-02-20 | Disposition: A | Discharge status: Home / Self Care | Payer: Medicare Other | Source: Ambulatory Visit | Attending: General Surgery | Admitting: General Surgery

## 2011-02-20 ENCOUNTER — Ambulatory Visit
Admission: RE | Admit: 2011-02-20 | Discharge: 2011-02-20 | Disposition: A | Discharge status: Home / Self Care | Payer: Medicare Other | Source: Ambulatory Visit | Attending: General Surgery | Admitting: General Surgery

## 2011-02-20 ENCOUNTER — Other Ambulatory Visit (INDEPENDENT_AMBULATORY_CARE_PROVIDER_SITE_OTHER): Payer: Self-pay | Admitting: General Surgery

## 2011-02-20 DIAGNOSIS — Z01811 Encounter for preprocedural respiratory examination: Secondary | ICD-10-CM

## 2011-02-20 LAB — BASIC METABOLIC PANEL
Chloride: 106 mEq/L (ref 96–112)
GFR calc Af Amer: >60 mL/min (ref >60)
Potassium: 3.8 mEq/L (ref 3.5–5.1)

## 2011-02-21 ENCOUNTER — Other Ambulatory Visit (INDEPENDENT_AMBULATORY_CARE_PROVIDER_SITE_OTHER): Payer: Self-pay | Admitting: General Surgery

## 2011-02-21 ENCOUNTER — Ambulatory Visit
Admission: RE | Admit: 2011-02-21 | Discharge: 2011-02-21 | Disposition: A | Discharge status: Home / Self Care | Payer: Medicare Other | Source: Ambulatory Visit | Attending: General Surgery | Admitting: General Surgery

## 2011-02-21 ENCOUNTER — Ambulatory Visit (HOSPITAL_BASED_OUTPATIENT_CLINIC_OR_DEPARTMENT_OTHER)
Admission: RE | Admit: 2011-02-21 | Discharge: 2011-02-21 | Disposition: A | Discharge status: Home / Self Care | Payer: Medicare Other | Source: Ambulatory Visit | Attending: General Surgery | Admitting: General Surgery

## 2011-02-21 DIAGNOSIS — I10 Essential (primary) hypertension: Secondary | ICD-10-CM | POA: Insufficient documentation

## 2011-02-21 DIAGNOSIS — Z01810 Encounter for preprocedural cardiovascular examination: Secondary | ICD-10-CM | POA: Insufficient documentation

## 2011-02-21 DIAGNOSIS — Z01812 Encounter for preprocedural laboratory examination: Secondary | ICD-10-CM | POA: Insufficient documentation

## 2011-02-21 DIAGNOSIS — D0590 Unspecified type of carcinoma in situ of unspecified breast: Secondary | ICD-10-CM | POA: Insufficient documentation

## 2011-02-21 DIAGNOSIS — C50912 Malignant neoplasm of unspecified site of left female breast: Secondary | ICD-10-CM

## 2011-02-21 DIAGNOSIS — Z01818 Encounter for other preprocedural examination: Secondary | ICD-10-CM | POA: Insufficient documentation

## 2011-02-21 DIAGNOSIS — K219 Gastro-esophageal reflux disease without esophagitis: Secondary | ICD-10-CM | POA: Insufficient documentation

## 2011-02-21 HISTORY — PX: BREAST LUMPECTOMY: SHX2

## 2011-02-21 LAB — POCT HEMOGLOBIN-HEMACUE: Hemoglobin: 14.4 g/dL (ref 12.0–15.0)

## 2011-02-23 ENCOUNTER — Telehealth: Payer: Self-pay | Admitting: Internal Medicine

## 2011-02-23 ENCOUNTER — Telehealth (INDEPENDENT_AMBULATORY_CARE_PROVIDER_SITE_OTHER): Payer: Self-pay | Admitting: General Surgery

## 2011-02-23 ENCOUNTER — Telehealth (INDEPENDENT_AMBULATORY_CARE_PROVIDER_SITE_OTHER): Payer: Self-pay

## 2011-02-23 MED ORDER — AMOXICILLIN-POT CLAVULANATE 500-125 MG PO TABS: 1.0000 | ORAL_TABLET | Freq: Three times a day (TID) | ORAL | Status: AC

## 2011-02-23 NOTE — Telephone Encounter (Signed)
Spoke w/ pt says its the same tooth that she had infection in back in June. Was supposed to have tooth pulled on Tues. but had to have breast surgery. Unable to get to dentist until Monday and is afraid to go through the weekend says tooth is already somewhat painful would like antibiotic called in to get to Monday.

## 2011-02-23 NOTE — Telephone Encounter (Signed)
Pt is aware.  

## 2011-02-23 NOTE — Telephone Encounter (Signed)
Patient called and stated she was post op breast lumpectomy and just had a tooth break off and wanted to know if it was safe to go to dentist to get it pulled. Dr Johna Sheriff said it should be fine. I told patient she is fine to go to dentist.

## 2011-02-23 NOTE — Telephone Encounter (Signed)
Start Augmentin 500 mg 1 by mouth 3 times a day, #21, no refills. If symptoms severe, facial swelling or getting worse: needs to go to urgent care.

## 2011-02-23 NOTE — Telephone Encounter (Signed)
Call the patient and reviewed pathology report.

## 2011-02-26 NOTE — Op Note (Signed)
  NAMEMarland Kitchen  ARYAHNA, SPAGNA            ACCOUNT NO.:  [id number]  MEDICAL RECORD NO.:  [id number]  LOCATION:                                 FACILITY:  PHYSICIAN:  Lorne Skeens. Arin Vanosdol, M.D.DATE OF BIRTH:  12-Jun-1936  DATE OF PROCEDURE: DATE OF DISCHARGE:                              OPERATIVE REPORT   PREOPERATIVE DIAGNOSIS:  Ductal carcinoma in situ, left breast.  POSTOPERATIVE DIAGNOSIS:  Ductal carcinoma in situ, left breast.  SURGICAL PROCEDURES:  Needle-localized left breast lumpectomy.  SURGEON:  Lorne Skeens. Vondell Sowell, MD  ANESTHESIA:  Laryngeal mask general.  BRIEF HISTORY:  Ms. Doris Mcdaniel is a 75 year old female who on a recent mammogram after a suspicious breast exam by Dr. Drue Novel was found to have a new quite small focus of microcalcifications less than 1 cm in the upper inner quadrant of the left breast.  Stereotactic core biopsy was recommended revealing high-grade ductal carcinoma in situ with focal necrosis.  MRI showed biopsy changes, but no concerning mass of any kind.  After discussion of treatment options, we have elected to proceed with needle-localized left breast lumpectomy.  The nature of the procedure, indications, risks of bleeding, infection, anesthetic complications, and possible need for further surgery based on pathology were discussed and understood.  She is now brought to the operating room for this procedure.  DESCRIPTION OF OPERATION:  The patient was brought to the operating room and placed in supine position on the operating table and laryngeal mask general anesthesia was induced.  She had undergone needle localization at the [facility] preoperatively.  The wire was trimmed to length. The breast was widely sterilely prepped and draped.  She received preoperative IV antibiotics.  Correct patient and procedure were verified.  I made a curvilinear incision just lateral to the medial wire insertion site and dissection was carried down to the  subcutaneous tissue toward the breast capsule.  The wire was brought into the incision.  I then excised a core of tissue around the shaft and tip of the wire.  The specimen was inked and oriented.  Specimen mammography showed the clip well within the specimen.  The wire was intact.  This was sent for permanent pathology.  Soft tissue was infiltrated with Marcaine and complete hemostasis was obtained with cautery.  The breast tissue was reapproximated with interrupted 3-0 Vicryl as was the subcutaneous tissue and the skin was closed with a running subcuticular Monocryl and Dermabond.  Sponges and instrument counts were correct.  The patient was taken to the recovery room in good condition.     Lorne Skeens. Shaka Cardin, M.D.     Tory Emerald  D:  02/21/2011  T:  02/22/2011  Job:  [id number]  Electronically Signed by Glenna Fellows M.D. on 02/26/2011 03:39:28 PM

## 2011-03-08 ENCOUNTER — Encounter (INDEPENDENT_AMBULATORY_CARE_PROVIDER_SITE_OTHER): Payer: Self-pay | Admitting: General Surgery

## 2011-03-08 ENCOUNTER — Ambulatory Visit (INDEPENDENT_AMBULATORY_CARE_PROVIDER_SITE_OTHER): Payer: Medicare Other | Admitting: General Surgery

## 2011-03-08 DIAGNOSIS — D0590 Unspecified type of carcinoma in situ of unspecified breast: Secondary | ICD-10-CM

## 2011-03-08 DIAGNOSIS — C50919 Malignant neoplasm of unspecified site of unspecified female breast: Secondary | ICD-10-CM

## 2011-03-08 NOTE — Patient Instructions (Signed)
The cancer center should contact you for appointments. Call for any questions or concerns.

## 2011-03-08 NOTE — Progress Notes (Signed)
The patient returns following left breast lumpectomy for ductal carcinoma in situ. She reports she is doing well without any concerns .  On examination her wound is healing nicely without infection, hematoma, or other problem .  We reviewed her pathology. This revealed only residual biopsy site changes with no evidence of residual carcinoma.  Patient is doing well following lumpectomy. She had no residual DCIS. We are going to arrange followup at the cancer Center for medical and radiation oncology. She may have an opportunity for no additional treatment.  I plan to see her back in 6 months for more long-term followup.

## 2011-03-08 NOTE — Progress Notes (Signed)
Addended by: Maryan Puls on: 03/08/2011 12:18 PM   Modules accepted: Orders

## 2011-03-21 ENCOUNTER — Encounter (HOSPITAL_BASED_OUTPATIENT_CLINIC_OR_DEPARTMENT_OTHER): Payer: Medicare Other | Admitting: Oncology

## 2011-03-21 ENCOUNTER — Other Ambulatory Visit: Payer: Self-pay | Admitting: Oncology

## 2011-03-21 ENCOUNTER — Ambulatory Visit
Admission: RE | Admit: 2011-03-21 | Discharge: 2011-03-21 | Disposition: A | Discharge status: Home / Self Care | Payer: Medicare Other | Source: Ambulatory Visit | Attending: Radiation Oncology | Admitting: Radiation Oncology

## 2011-03-21 DIAGNOSIS — Z510 Encounter for antineoplastic radiation therapy: Secondary | ICD-10-CM | POA: Insufficient documentation

## 2011-03-21 DIAGNOSIS — Z7982 Long term (current) use of aspirin: Secondary | ICD-10-CM | POA: Insufficient documentation

## 2011-03-21 DIAGNOSIS — Z87891 Personal history of nicotine dependence: Secondary | ICD-10-CM | POA: Insufficient documentation

## 2011-03-21 DIAGNOSIS — Z90710 Acquired absence of both cervix and uterus: Secondary | ICD-10-CM | POA: Insufficient documentation

## 2011-03-21 DIAGNOSIS — D0590 Unspecified type of carcinoma in situ of unspecified breast: Secondary | ICD-10-CM | POA: Insufficient documentation

## 2011-03-21 DIAGNOSIS — K219 Gastro-esophageal reflux disease without esophagitis: Secondary | ICD-10-CM | POA: Insufficient documentation

## 2011-03-21 DIAGNOSIS — Z79899 Other long term (current) drug therapy: Secondary | ICD-10-CM | POA: Insufficient documentation

## 2011-03-21 DIAGNOSIS — E785 Hyperlipidemia, unspecified: Secondary | ICD-10-CM | POA: Insufficient documentation

## 2011-03-21 DIAGNOSIS — I10 Essential (primary) hypertension: Secondary | ICD-10-CM | POA: Insufficient documentation

## 2011-03-21 LAB — CBC WITH DIFFERENTIAL/PLATELET
Basophils Absolute: 0 10*3/uL (ref 0.0–0.1)
EOS%: 2.5 % (ref 0.0–7.0)
Eosinophils Absolute: 0.2 10*3/uL (ref 0.0–0.5)
HGB: 13.1 g/dL (ref 11.6–15.9)
MONO#: 0.6 10*3/uL (ref 0.1–0.9)
NEUT#: 4.5 10*3/uL (ref 1.5–6.5)
RDW: 14 % (ref 11.2–14.5)
lymph#: 2.1 10*3/uL (ref 0.9–3.3)

## 2011-03-22 LAB — COMPREHENSIVE METABOLIC PANEL
AST: 22 U/L (ref 0–37)
Albumin: 4.4 g/dL (ref 3.5–5.2)
BUN: 18 mg/dL (ref 6–23)
Calcium: 9.2 mg/dL (ref 8.4–10.5)
Chloride: 102 mEq/L (ref 96–112)
Glucose, Bld: 99 mg/dL (ref 70–99)
Potassium: 3.9 mEq/L (ref 3.5–5.3)

## 2011-03-23 ENCOUNTER — Encounter: Payer: Medicare Other | Admitting: Oncology

## 2011-06-19 ENCOUNTER — Encounter: Payer: Medicare Other | Admitting: Internal Medicine

## 2011-07-09 ENCOUNTER — Ambulatory Visit (INDEPENDENT_AMBULATORY_CARE_PROVIDER_SITE_OTHER): Payer: Medicare Other | Admitting: Internal Medicine

## 2011-07-09 ENCOUNTER — Encounter: Payer: Self-pay | Admitting: Internal Medicine

## 2011-07-09 VITALS — BP 130/62 | HR 71 | Temp 98.4°F | Ht 66.0 in | Wt 177.6 lb

## 2011-07-09 DIAGNOSIS — K219 Gastro-esophageal reflux disease without esophagitis: Secondary | ICD-10-CM

## 2011-07-09 DIAGNOSIS — Z23 Encounter for immunization: Secondary | ICD-10-CM

## 2011-07-09 DIAGNOSIS — S22009A Unspecified fracture of unspecified thoracic vertebra, initial encounter for closed fracture: Secondary | ICD-10-CM

## 2011-07-09 DIAGNOSIS — E785 Hyperlipidemia, unspecified: Secondary | ICD-10-CM

## 2011-07-09 DIAGNOSIS — Z0000 Encounter for general adult medical examination without abnormal findings: Secondary | ICD-10-CM

## 2011-07-09 LAB — LIPID PANEL: HDL: 50 mg/dL (ref >39.00)

## 2011-07-09 LAB — TSH: TSH: 0.62 u[IU]/mL (ref 0.35–5.50)

## 2011-07-09 NOTE — Assessment & Plan Note (Signed)
Td 10-08 flu shot today  pneumonia shot--2007 (completed) shingles immunization? reports she had shingles at age 75 and in the 10s . "only will take if you guarantee me it wont give me side effects and I'll never have shingles again".   last PAP years ago, no h/o abnormal PAPs, s/p hysterectomy due to bleeding :rec. PAP if so desire  H/o breast ca, to see oncology soon, next per oncology  last colonoscopy 06/2009, negative, next 10 years  diet- exercise discussed

## 2011-07-09 NOTE — Progress Notes (Signed)
Subjective:    Patient ID: Doris Mcdaniel, female    DOB: [birth year], 74 y.o.   MRN: [id number]  HPI Here for Medicare AWV: 1. Risk factors based on Past M, S, F history: reviewed  2. Physical Activities: walks frecuently, yard work, active  3. Depression/mood: no major problems but occasionally gets "aggravated" by her g-daughter              (counseled, will call if sx are  persistent)  4. Hearing: no problems noted to normal conversation  5. ADL's: totally independent  6. Fall Risk: low risk, no h/o falls  7. Home Safety: feels safe at home  8. Height, weight, &visual acuity: see VS, had cataract surgery , vision is great 9. Counseling: yes, se below  10. Labs ordered based on risk factors:  11.           Referral Coordination, if needed  12.           Care Plan, see a/p  13.            Cognitive Assessment: motor skills , cognition and memory seem appropiate   in addition we've discussed the following issues HTN--  good medication compliance, amb BPs wnl Hyperlipidemia-- on life style treatment only Vertebral FX-- was Rx fosamax, never tried after she read the adverse effects, besides states  "cancer doctor did not think i needed it"  Past Medical History  Diagnosis Date  . Hyperlipidemia   . Hypertension   . GERD (gastroesophageal reflux disease)   . Abnormal x-ray of spine     Mild Compression deformity of the the T8 vertebral body, age indertiminate   . Allergic rhinitis    Past Surgical History  Procedure Date  . Abdominal hysterectomy   . Oophorectomy     they left a part of the ovarly  . Cholecystectomy 03/2008  . Cataract extraction   . Breast surgery     lumpectomy (L)   History   Social History  . Marital Status: Married    Spouse Name: N/A    Number of Children: 2  . Years of Education: N/A   Occupational History  . Not on file.   Social History Main Topics  . Smoking status: Former Smoker    Quit date: 07/08/1973  . Smokeless tobacco: Never  Used   Comment: used to smoke 1 PPD  . Alcohol Use: No  . Drug Use: No  . Sexually Active: Not on file   Other Topics Concern  . Not on file   Social History Narrative  . No narrative on file   Family History  Problem Relation Age of Onset  . Colon cancer Neg Hx   . Breast cancer Neg Hx   . Stroke Neg Hx   . Coronary artery disease      Brother CABG in his 66s  . Hypertension Neg Hx   . Diabetes Neg Hx     Review of Systems  Respiratory: Negative for cough and shortness of breath.   Cardiovascular: Negative for chest pain and leg swelling.  Gastrointestinal: Negative for abdominal pain and blood in stool.       On PPIs prn only  Genitourinary: Negative for dysuria and hematuria.       Objective:   Physical Exam  Constitutional: She is oriented to person, place, and time. She appears well-developed and well-nourished. No distress.  HENT:  Head: Normocephalic and atraumatic.  Neck: No thyromegaly present.  Cardiovascular: Normal rate,  regular rhythm and normal heart sounds.   No murmur heard. Pulmonary/Chest: Effort normal and breath sounds normal. No respiratory distress. She has no wheezes. She has no rales.  Abdominal: Soft. Bowel sounds are normal. She exhibits no distension. There is no tenderness. There is no rebound and no guarding.  Musculoskeletal: She exhibits no edema.  Neurological: She is alert and oriented to person, place, and time.  Skin: She is not diaphoretic.  Psychiatric: She has a normal mood and affect. Her behavior is normal. Judgment and thought content normal.      Assessment & Plan:

## 2011-07-09 NOTE — Assessment & Plan Note (Signed)
At goal.  

## 2011-07-09 NOTE — Assessment & Plan Note (Addendum)
On diet only , has lost 17 pounds in few months

## 2011-07-09 NOTE — Assessment & Plan Note (Addendum)
History of compression fracture, bone density test 07/2010---> normal. Based on the fracture, I recommended treatment. She decided not to take Fosamax ("oncologist didn't think I needed it"), aware there is other options

## 2011-07-09 NOTE — Assessment & Plan Note (Signed)
Well controlled at present

## 2011-07-18 ENCOUNTER — Encounter: Payer: Self-pay | Admitting: Radiation Oncology

## 2011-07-20 ENCOUNTER — Encounter: Payer: Self-pay | Admitting: Radiation Oncology

## 2011-07-20 ENCOUNTER — Ambulatory Visit
Admission: RE | Admit: 2011-07-20 | Discharge: 2011-07-20 | Disposition: A | Discharge status: Home / Self Care | Payer: Medicare Other | Source: Ambulatory Visit | Attending: Radiation Oncology | Admitting: Radiation Oncology

## 2011-07-20 DIAGNOSIS — D0590 Unspecified type of carcinoma in situ of unspecified breast: Secondary | ICD-10-CM

## 2011-07-20 NOTE — Progress Notes (Signed)
CC:   Doris Mcdaniel. Hoxworth, M.D. Doris Ora, MD Doris Mcdaniel, M.D.   DIAGNOSIS:  Ductal carcinoma in situ of the left breast.  INTERVAL HISTORY:  Doris Mcdaniel returns to the clinic today for followup.  She completed her course of adjuvant radiotherapy to the left breast on 05/29/2011.  The patient states that she has done very well since completing that treatment.  She feels that her skin has essentially healed up completely and she has no new problems in the breast area.  PHYSICAL EXAMINATION:  Skin:  Some residual hyperpigmentation is present without any desquamation or ongoing erythema.  Overall, her skin looks very good.  IMPRESSION AND PLAN:  Doris Mcdaniel has done very well since completing her course of radiation and I am pleased with how her skin looks at this time.  She does have ongoing followup scheduled with Dr. Darnelle Catalan and I will have her return to our clinic on a p.r.n. basis.  She indicates that, based on her presentation, small size, and lack of ability to do receptor studies, she is not on any hormonal treatment.    ______________________________ Radene Gunning, M.D., Ph.D. JSM/MEDQ  D:  07/20/2011  T:  07/20/2011  Job:  [id number]

## 2011-07-20 NOTE — Progress Notes (Signed)
Patient presents to the clinic today unaccompanied for follow up appointment with Dr. Mitzi Hansen. Patient is alert and oriented to person, place, and time. No distress noted. Steady gait noted. Pleasant affect noted. Patient denies pain at this time. Patient has not complaints at this time. Patient verbalizes the skin of her breast looks "great." Patient reports following up with her PCP last week and that she got a good report. Patient reports losing 17 pounds in six month by eating more healthy.

## 2011-07-28 ENCOUNTER — Telehealth: Payer: Self-pay | Admitting: Oncology

## 2011-07-28 NOTE — Telephone Encounter (Signed)
called pt and informed her of appts for aug2013

## 2011-09-05 ENCOUNTER — Ambulatory Visit (INDEPENDENT_AMBULATORY_CARE_PROVIDER_SITE_OTHER): Payer: Medicare Other | Admitting: General Surgery

## 2011-09-05 ENCOUNTER — Encounter (INDEPENDENT_AMBULATORY_CARE_PROVIDER_SITE_OTHER): Payer: Self-pay | Admitting: General Surgery

## 2011-09-05 DIAGNOSIS — D0590 Unspecified type of carcinoma in situ of unspecified breast: Secondary | ICD-10-CM

## 2011-09-05 DIAGNOSIS — Z853 Personal history of malignant neoplasm of breast: Secondary | ICD-10-CM | POA: Insufficient documentation

## 2011-09-05 NOTE — Progress Notes (Signed)
Chief complaint: Followup breast cancer  History: Patient returns for followup status post left breast lumpectomy for a small area of ductal carcinoma in situ now 6 months ago. She received postoperative radiation therapy. She was evaluated by Dr. Darnelle Catalan and is not receiving hormonal treatment as her tumor was very small and receptors were not obtained. She reports no problems from her treatment or any recent changes in her breast such as lumps, skin changes or discharge.  Exam: General: Healthy-appearing in no distress Skin: Warm and dry no rash or infection Lymph nodes: No palpable cervical, supraclavicular, or axillary nodes Breasts: Well-healed incision upper or left breast. There are no masses in either breast. There is some crusting on the nipples bilaterally which she says have been present for many years.  Imaging: Due in June  Assessment and plan: Doing well following left breast lumpectomy and radiation for DCIS on observation only. I will see her back in 6 months.

## 2011-09-20 ENCOUNTER — Ambulatory Visit (INDEPENDENT_AMBULATORY_CARE_PROVIDER_SITE_OTHER): Payer: Medicare Other | Admitting: Family Medicine

## 2011-09-20 DIAGNOSIS — J209 Acute bronchitis, unspecified: Secondary | ICD-10-CM

## 2011-09-20 DIAGNOSIS — J329 Chronic sinusitis, unspecified: Secondary | ICD-10-CM

## 2011-09-20 DIAGNOSIS — J40 Bronchitis, not specified as acute or chronic: Secondary | ICD-10-CM

## 2011-09-20 DIAGNOSIS — J019 Acute sinusitis, unspecified: Secondary | ICD-10-CM

## 2011-09-20 MED ORDER — HYDROCODONE-HOMATROPINE 5-1.5 MG/5ML PO SYRP: 5.0000 mL | ORAL_SOLUTION | Freq: Three times a day (TID) | ORAL | Status: AC | PRN

## 2011-09-20 MED ORDER — LEVOFLOXACIN 500 MG PO TABS: 500.0000 mg | ORAL_TABLET | Freq: Every day | ORAL | Status: AC

## 2011-09-20 NOTE — Progress Notes (Signed)
  Subjective:    Patient ID: Doris Mcdaniel, female    DOB: 02-09-1936, 76 y.o.   MRN: [id number]  HPI  Patient presents with a 4 day history of fever to 101, facial congestion, PND and cough productive of milky green sputum.  Denies facial or dental pain.  Denies SOB or wheezing.  No known sick contacts. Non smoker  PMH HTN    Review of Systems     Objective:   Physical Exam  Constitutional: She appears well-developed and well-nourished.  HENT:  Right Ear: Tympanic membrane is retracted.  Left Ear: Tympanic membrane is retracted.  Nose: Rhinorrhea (purulent) present.  Mouth/Throat: Posterior oropharyngeal erythema present.  Cardiovascular: Normal rate and regular rhythm.   Pulmonary/Chest: Effort normal. She has rhonchi (shifting with cough).  Lymphadenopathy:    She has cervical adenopathy.  Skin: Skin is warm.          Assessment & Plan:   1. Sinusitis  Levoquin 500mg  QD x 10days  2. Bronchitis  HYDROcodone-homatropine (HYCODAN) 5-1.5 MG/5ML syrup   Anticipatory guidance Follow up with primary MD

## 2012-01-15 ENCOUNTER — Encounter: Payer: Self-pay | Admitting: Internal Medicine

## 2012-01-15 ENCOUNTER — Telehealth: Payer: Self-pay | Admitting: Internal Medicine

## 2012-01-15 ENCOUNTER — Ambulatory Visit (INDEPENDENT_AMBULATORY_CARE_PROVIDER_SITE_OTHER): Payer: Medicare Other | Admitting: Internal Medicine

## 2012-01-15 VITALS — BP 118/72 | HR 53 | Temp 97.9°F | Wt 178.0 lb

## 2012-01-15 DIAGNOSIS — D0590 Unspecified type of carcinoma in situ of unspecified breast: Secondary | ICD-10-CM

## 2012-01-15 DIAGNOSIS — Z853 Personal history of malignant neoplasm of breast: Secondary | ICD-10-CM

## 2012-01-15 DIAGNOSIS — I10 Essential (primary) hypertension: Secondary | ICD-10-CM

## 2012-01-15 LAB — BASIC METABOLIC PANEL
BUN: 22 mg/dL (ref 6–23)
CO2: 28 mEq/L (ref 19–32)
Chloride: 104 mEq/L (ref 96–112)
Creatinine, Ser: 0.9 mg/dL (ref 0.4–1.2)
Glucose, Bld: 92 mg/dL (ref 70–99)

## 2012-01-15 MED ORDER — VERAPAMIL HCL ER 240 MG PO TBCR: 240.0000 mg | EXTENDED_RELEASE_TABLET | Freq: Every day | ORAL | Status: DC

## 2012-01-15 MED ORDER — HYDROCHLOROTHIAZIDE 25 MG PO TABS: 25.0000 mg | ORAL_TABLET | Freq: Every day | ORAL | Status: DC

## 2012-01-15 MED ORDER — LISINOPRIL 20 MG PO TABS: 20.0000 mg | ORAL_TABLET | Freq: Every day | ORAL | Status: DC

## 2012-01-15 MED ORDER — FLUTICASONE PROPIONATE 50 MCG/ACT NA SUSP: 2.0000 | Freq: Every day | NASAL | Status: DC

## 2012-01-15 MED ORDER — OMEPRAZOLE 20 MG PO CPDR: 20.0000 mg | DELAYED_RELEASE_CAPSULE | Freq: Every day | ORAL | Status: DC

## 2012-01-15 NOTE — Telephone Encounter (Signed)
Please call radiology: Schedule whatever test is needed, I'm not sure if she just needs mammograms or also a ultrasound. She has a history of breast cancer

## 2012-01-15 NOTE — Assessment & Plan Note (Signed)
Well-controlled,  check a BMP 

## 2012-01-15 NOTE — Assessment & Plan Note (Signed)
left breast lumpectomy for a small area of ductal carcinoma in situ~ 03-2011. She received postoperative radiation therapy.  She was evaluated by Dr. Darnelle Catalan and is not receiving hormonal treatment as her tumor was very small and receptors were not obtained. Plan: refer to radiology

## 2012-01-15 NOTE — Progress Notes (Signed)
  Subjective:    Patient ID: Doris Mcdaniel, female    DOB: 1935/09/01, 76 y.o.   MRN: [id number]  HPI Routine office visit Feels very well, no major concerns, needs several refills. Allergies are well controlled for the most part.  Past Medical History  Diagnosis Date  . Hyperlipidemia   . Hypertension   . GERD (gastroesophageal reflux disease)   . Abnormal x-ray of spine     Mild Compression deformity of the the T8 vertebral body, age indertiminate   . Allergic rhinitis   . History of breast cancer     left breast   Past Surgical History  Procedure Date  . Abdominal hysterectomy   . Oophorectomy     they left a part of the ovarly  . Cholecystectomy 03/2008  . Cataract extraction   . Breast surgery     lumpectomy (L)   Review of Systems Meds reviewed, good compliance, ambulatory BPs around 118/60. She was diagnosed with a small breast cancer last year, emotionally doing well. She is status post radiation therapy and is due for a followup x-ray. Request me to arrange it.     Objective:   Physical Exam  General -- alert, well-developed, and well-nourished.   Lungs -- normal respiratory effort, no intercostal retractions, no accessory muscle use, and normal breath sounds.   Heart-- normal rate, regular rhythm, no murmur, and no gallop.   LE w/o edema Neurologic-- alert & oriented X3 and strength normal in all extremities. Psych-- Cognition and judgment appear intact. Alert and cooperative with normal attention span and concentration.  not anxious appearing and not depressed appearing.      Assessment & Plan:

## 2012-01-16 NOTE — Telephone Encounter (Signed)
Please enter order for Diagnostic bilateral mammogram, if the [facility] Center finds need to perform ultrasound after mammo, they will notify us.

## 2012-01-16 NOTE — Telephone Encounter (Signed)
Order entered

## 2012-01-17 ENCOUNTER — Encounter: Payer: Self-pay | Admitting: *Deleted

## 2012-02-26 ENCOUNTER — Ambulatory Visit
Admission: RE | Admit: 2012-02-26 | Discharge: 2012-02-26 | Disposition: A | Discharge status: Home / Self Care | Payer: Medicare Other | Source: Ambulatory Visit | Attending: Internal Medicine | Admitting: Internal Medicine

## 2012-02-26 DIAGNOSIS — Z853 Personal history of malignant neoplasm of breast: Secondary | ICD-10-CM

## 2012-03-03 ENCOUNTER — Ambulatory Visit (INDEPENDENT_AMBULATORY_CARE_PROVIDER_SITE_OTHER): Payer: Medicare Other | Admitting: General Surgery

## 2012-03-03 ENCOUNTER — Encounter (INDEPENDENT_AMBULATORY_CARE_PROVIDER_SITE_OTHER): Payer: Self-pay | Admitting: General Surgery

## 2012-03-03 VITALS — BP 102/70 | HR 68 | Temp 97.6°F | Resp 14 | Ht 66.0 in | Wt 180.0 lb

## 2012-03-03 DIAGNOSIS — D0590 Unspecified type of carcinoma in situ of unspecified breast: Secondary | ICD-10-CM

## 2012-03-03 NOTE — Progress Notes (Signed)
Chief complaint: Followup breast cancer  History: Patient returns just over one year following left breast lumpectomy for a small area of ductal carcinoma in situ. She underwent radiation but no hormonal therapy. The tissue sample was actually too small for hormone receptors. She reports no problems. Mammogram was -2 weeks ago. Specifically no breast lumps, pain, nipple discharge or skin changes.  Exam: General: Appears well Lymph nodes: No cervical, supraclavicular, or axillary nodes palpable Breasts: Well-healed lumpectomy site upper inner left breast. No palpable masses in either breast. No skin changes. No nipple inversion.  Assessment and plan:status post left breast lumpectomy and radiation for stage 0 cancer of the left breast. Doing well with no evidence of recurrence or new breast cancer.return 6 months.

## 2012-03-24 ENCOUNTER — Ambulatory Visit (HOSPITAL_BASED_OUTPATIENT_CLINIC_OR_DEPARTMENT_OTHER): Payer: Medicare Other | Admitting: Oncology

## 2012-03-24 VITALS — BP 145/65 | HR 77 | Temp 98.3°F | Resp 20 | Ht 66.0 in | Wt 182.2 lb

## 2012-03-24 DIAGNOSIS — D0590 Unspecified type of carcinoma in situ of unspecified breast: Secondary | ICD-10-CM
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
## 2012-06-24 ENCOUNTER — Encounter: Payer: Self-pay | Admitting: Internal Medicine

## 2012-06-24 ENCOUNTER — Ambulatory Visit (INDEPENDENT_AMBULATORY_CARE_PROVIDER_SITE_OTHER): Payer: Medicare Other | Admitting: Internal Medicine

## 2012-06-24 VITALS — BP 128/74 | HR 62 | Temp 97.9°F | Ht 67.0 in | Wt 180.0 lb

## 2012-06-24 DIAGNOSIS — F329 Major depressive disorder, single episode, unspecified: Secondary | ICD-10-CM | POA: Insufficient documentation

## 2012-06-24 DIAGNOSIS — D0590 Unspecified type of carcinoma in situ of unspecified breast: Secondary | ICD-10-CM

## 2012-06-24 DIAGNOSIS — I10 Essential (primary) hypertension: Secondary | ICD-10-CM

## 2012-06-24 DIAGNOSIS — E785 Hyperlipidemia, unspecified: Secondary | ICD-10-CM

## 2012-06-24 DIAGNOSIS — Z0000 Encounter for general adult medical examination without abnormal findings: Secondary | ICD-10-CM

## 2012-06-24 DIAGNOSIS — S22009A Unspecified fracture of unspecified thoracic vertebra, initial encounter for closed fracture: Secondary | ICD-10-CM

## 2012-06-24 DIAGNOSIS — F32A Depression, unspecified: Secondary | ICD-10-CM | POA: Insufficient documentation

## 2012-06-24 DIAGNOSIS — F3289 Other specified depressive episodes: Secondary | ICD-10-CM

## 2012-06-24 DIAGNOSIS — K219 Gastro-esophageal reflux disease without esophagitis: Secondary | ICD-10-CM

## 2012-06-24 DIAGNOSIS — Z23 Encounter for immunization: Secondary | ICD-10-CM

## 2012-06-24 NOTE — Assessment & Plan Note (Addendum)
Pt reports the last time she saw oncology, was recommended to followup with surgery regularly, followup with oncology will be as needed. MMG normal 01-2012

## 2012-06-24 NOTE — Assessment & Plan Note (Signed)
Labs

## 2012-06-24 NOTE — Assessment & Plan Note (Addendum)
Patient with depression, see history of present illness. She declined to fill the PHQ 9, "I know what the problem is" (see HPI) She is counseled to the best of my ability. Declined a referral  a counselor, declined to started medications , benefits of Rx discussed Knows to call  if she needs help

## 2012-06-24 NOTE — Assessment & Plan Note (Signed)
Well controlled 

## 2012-06-24 NOTE — Assessment & Plan Note (Addendum)
DEXA 07-2010, plan is to repeat a bone density test in few months Vitamin D when checked was normal. Recommend calcium, vitamin D supplements and stay active

## 2012-06-24 NOTE — Assessment & Plan Note (Addendum)
Td 10-08 flu shot today  pneumonia shot--2007 (completed) shingles immunization? reports she had shingles at age 76 and in the 27s . Pt again said: "only will take if you guarantee me it wont give me side effects and I'll never have shingles again".   last PAP years ago, no h/o abnormal PAPs, s/p hysterectomy due to bleeding, married once : declined PAP (she is very low risk)  H/o breast ca, to see oncology soon, last MMG 6-13 (-) last colonoscopy 06/2009, negative, next 10 years  diet- exercise discussed

## 2012-06-24 NOTE — Patient Instructions (Addendum)
Get labs at the other [place]  office located at: [address] [place], across from [facility]  Please go to the basement, this is a walk-in facility  Tylenol as needed for pain in the arm, let me know if you're not improving in the next 2 weeks   Fall Prevention and Home Safety Falls cause injuries and can affect all age groups. It is possible to use preventive measures to significantly decrease the likelihood of falls. There are many simple measures which can make your home safer and prevent falls. OUTDOORS  Repair cracks and edges of walkways and driveways.  Remove high doorway thresholds.  Trim shrubbery on the main path into your home.  Have good outside lighting.  Clear walkways of tools, rocks, debris, and clutter.  Check that handrails are not broken and are securely fastened. Both sides of steps should have handrails.  Have leaves, snow, and ice cleared regularly.  Use sand or salt on walkways during winter months.  In the garage, clean up grease or oil spills. BATHROOM  Install night lights.  Install grab bars by the toilet and in the tub and shower.  Use non-skid mats or decals in the tub or shower.  Place a plastic non-slip stool in the shower to sit on, if needed.  Keep floors dry and clean up all water on the floor immediately.  Remove soap buildup in the tub or shower on a regular basis.  Secure bath mats with non-slip, double-sided rug tape.  Remove throw rugs and tripping hazards from the floors. BEDROOMS  Install night lights.  Make sure a bedside light is easy to reach.  Do not use oversized bedding.  Keep a telephone by your bedside.  Have a firm chair with side arms to use for getting dressed.  Remove throw rugs and tripping hazards from the floor. KITCHEN  Keep handles on pots and pans turned toward the center of the stove. Use back burners when possible.  Clean up spills quickly and allow time for drying.  Avoid  walking on wet floors.  Avoid hot utensils and knives.  Position shelves so they are not too high or low.  Place commonly used objects within easy reach.  If necessary, use a sturdy step stool with a grab bar when reaching.  Keep electrical cables out of the way.  Do not use floor polish or wax that makes floors slippery. If you must use wax, use non-skid floor wax.  Remove throw rugs and tripping hazards from the floor. STAIRWAYS  Never leave objects on stairs.  Place handrails on both sides of stairways and use them. Fix any loose handrails. Make sure handrails on both sides of the stairways are as long as the stairs.  Check carpeting to make sure it is firmly attached along stairs. Make repairs to worn or loose carpet promptly.  Avoid placing throw rugs at the top or bottom of stairways, or properly secure the rug with carpet tape to prevent slippage. Get rid of throw rugs, if possible.  Have an electrician put in a light switch at the top and bottom of the stairs. OTHER FALL PREVENTION TIPS  Wear low-heel or rubber-soled shoes that are supportive and fit well. Wear closed toe shoes.  When using a stepladder, make sure it is fully opened and both spreaders are firmly locked. Do not climb a closed stepladder.  Add color or contrast paint or tape to grab bars and handrails in your home. Place contrasting  color strips on first and last steps.  Learn and use mobility aids as needed. Install an electrical emergency response system.  Turn on lights to avoid dark areas. Replace light bulbs that burn out immediately. Get light switches that glow.  Arrange furniture to create clear pathways. Keep furniture in the same place.  Firmly attach carpet with non-skid or double-sided tape.  Eliminate uneven floor surfaces.  Select a carpet pattern that does not visually hide the edge of steps.  Be aware of all pets. OTHER HOME SAFETY TIPS  Set the water temperature for 120 F  (48.8 C).  Keep emergency numbers on or near the telephone.  Keep smoke detectors on every level of the home and near sleeping areas. Document Released: 07/20/2002 Document Revised: 01/29/2012 Document Reviewed: 10/19/2011 [facility] Patient Information 2013 [place], [place].

## 2012-06-24 NOTE — Assessment & Plan Note (Signed)
Well-controlled,  check a BMP 

## 2012-06-24 NOTE — Progress Notes (Signed)
Subjective:    Patient ID: Doris Mcdaniel, female    DOB: 1936/05/21, 76 y.o.   MRN: [id number]  HPI Here for Medicare AWV: 1. Risk factors based on Past M, S, F history: reviewed   2. Physical Activities:  yard work, active   3. Depression/mood: no major problems but occasionally gets "aggravated" by her-daughter ,            see below   4. Hearing: no problems noted to normal conversation   5. ADL's: totally independent   6. Fall Risk:  no h/o falls  , see instructions 7. Home Safety: feels safe at home   8.Height, weight, &visual acuity: see VS, had cataract surgery , vision is great 9. Counseling: yes, se below   10.  Labs ordered based on risk factors:   11.  Referral Coordination, if needed   12.  Care Plan, see a/p   13.  Cognitive Assessment: motor skills , cognition and memory seem appropiate   in addition we've discussed the following issues Depression, patient very depressed and upset about her daughter and G-children, they seem not interested in visit or call her (per pt)  2 weeks history of left arm pain, biceps and triceps area, awake her at night, not associated with nausea, shortness of breath or chest pain. Pain goes away as soon as she wakes up and start walking. She suspect a muscle sprain, does not recall any accident. Hypertension, good medication compliance, not ambulatory BPs. BP today normal GERD, well-controlled with Prilosec.  Past Medical History  Diagnosis Date  . Hyperlipidemia   . Hypertension   . GERD (gastroesophageal reflux disease)   . Abnormal x-ray of spine     Mild Compression deformity of the the T8 vertebral body, age indertiminate   . Allergic rhinitis   . History of breast cancer 2012    left breast   Past Surgical History  Procedure Date  . Abdominal hysterectomy   . Oophorectomy     they left a part of the ovarly  . Cholecystectomy 03/2008  . Cataract extraction     B  . Breast surgery     lumpectomy (L)   History    Social History  . Marital Status: Married    Spouse Name: N/A    Number of Children: 2  . Years of Education: N/A   Occupational History  . Not on file.   Social History Main Topics  . Smoking status: Former Smoker    Quit date: 07/08/1973  . Smokeless tobacco: Never Used     Comment: used to smoke 1 PPD  . Alcohol Use: No  . Drug Use: No  . Sexually Active: Not on file   Other Topics Concern  . Not on file   Social History Narrative   Lives w/ husband    Family History  Problem Relation Age of Onset  . Colon cancer Neg Hx   . Breast cancer Neg Hx   . Stroke Neg Hx   . Hypertension Neg Hx   . Diabetes Neg Hx   . Heart disease Brother     CABG age ~ 64   Review of Systems  no nausea, vomiting, diarrhea or blood in the stools. No suicidal ideas No dysuria, gross hematuria, vaginal discharge or vaginal rash or lesions.     Objective:   Physical Exam  General -- alert, well-developed, and well-nourished.   Neck --no thyromegaly , no  LADs or supraclavicular nodes Lungs --  normal respiratory effort, no intercostal retractions, no accessory muscle use, and normal breath sounds.   Heart-- normal rate, regular rhythm, no murmur, and no gallop.  Chest wall nontender to palpation Abdomen--soft, non-tender, no distention, no masses, no HSM, no guarding, and no rigidity.   Extremities--  Arms and shoulders symmetric to palpation, nontender, range of motion normal. Neurologic-- alert & oriented X3 and strength normal in all extremities. Psych-- Cognition and judgment appear intact. Alert and cooperative with normal attention span and concentration.  not anxious appearing and not depressed appearing.       Assessment & Plan:   Left arm pain, Etiology not clear, sprain?. There is no associated cardio-pulmonar  symptoms. Plan: tylenol as needed, if symptoms increase she is to let me know.

## 2012-06-25 ENCOUNTER — Encounter: Payer: Self-pay | Admitting: Internal Medicine

## 2012-06-26 ENCOUNTER — Other Ambulatory Visit (INDEPENDENT_AMBULATORY_CARE_PROVIDER_SITE_OTHER): Payer: Medicare Other

## 2012-06-26 DIAGNOSIS — I10 Essential (primary) hypertension: Secondary | ICD-10-CM

## 2012-06-26 DIAGNOSIS — E785 Hyperlipidemia, unspecified: Secondary | ICD-10-CM

## 2012-06-26 LAB — LDL CHOLESTEROL, DIRECT: Direct LDL: 133.6 mg/dL

## 2012-06-26 LAB — LIPID PANEL
HDL: 46.7 mg/dL (ref >39.00)
Total CHOL/HDL Ratio: 4
VLDL: 21.4 mg/dL (ref 0.0–40.0)

## 2012-06-26 LAB — BASIC METABOLIC PANEL
CO2: 26 mEq/L (ref 19–32)
Calcium: 8.5 mg/dL (ref 8.4–10.5)
Chloride: 104 mEq/L (ref 96–112)
Glucose, Bld: 101 mg/dL — ABNORMAL HIGH (ref 70–99)
Sodium: 138 mEq/L (ref 135–145)

## 2012-06-30 ENCOUNTER — Encounter: Payer: Self-pay | Admitting: *Deleted

## 2012-09-05 ENCOUNTER — Encounter (INDEPENDENT_AMBULATORY_CARE_PROVIDER_SITE_OTHER): Payer: Self-pay | Admitting: General Surgery

## 2012-09-05 ENCOUNTER — Ambulatory Visit (INDEPENDENT_AMBULATORY_CARE_PROVIDER_SITE_OTHER): Payer: Medicare Other | Admitting: General Surgery

## 2012-09-05 VITALS — BP 122/78 | HR 74 | Temp 98.4°F | Resp 16 | Ht 67.0 in | Wt 183.0 lb

## 2012-09-05 DIAGNOSIS — D0590 Unspecified type of carcinoma in situ of unspecified breast: Secondary | ICD-10-CM

## 2012-09-05 NOTE — Progress Notes (Signed)
Chief complaint: Followup breast cancer   History: Patient returns 1 1/2 years following left breast lumpectomy for a small area of ductal carcinoma in situ. She underwent radiation but no hormonal therapy. The tissue sample was actually too small for hormone receptors. She reports no problems. Mammogram was July 2013 and was negative. Specifically no breast lumps, pain, nipple discharge or skin changes.   Exam: BP 122/78  Pulse 74  Temp 98.4 F (36.9 C) (Temporal)  Resp 16  Ht 5\' 7"  (1.702 m)  Wt 183 lb (83.008 kg)  BMI 28.66 kg/m2   General: Appears well  Lymph nodes: No cervical, supraclavicular, or axillary nodes palpable  Breasts: Well-healed lumpectomy site upper inner left breast. No palpable masses in either breast. No skin changes. No nipple inversion.   Assessment and plan:status post left breast lumpectomy and radiation for stage 0 cancer of the left breast. Doing well with no evidence of recurrence or new breast cancer.return 6 months.

## 2012-12-23 ENCOUNTER — Ambulatory Visit (INDEPENDENT_AMBULATORY_CARE_PROVIDER_SITE_OTHER): Payer: Medicare Other | Admitting: Internal Medicine

## 2012-12-23 VITALS — BP 108/72 | HR 58 | Temp 98.1°F | Wt 186.0 lb

## 2012-12-23 DIAGNOSIS — IMO0002 Reserved for concepts with insufficient information to code with codable children: Secondary | ICD-10-CM

## 2012-12-23 DIAGNOSIS — I10 Essential (primary) hypertension: Secondary | ICD-10-CM

## 2012-12-23 DIAGNOSIS — J309 Allergic rhinitis, unspecified: Secondary | ICD-10-CM

## 2012-12-23 LAB — CBC WITH DIFFERENTIAL/PLATELET
Basophils Absolute: 0.1 10*3/uL (ref 0.0–0.1)
Eosinophils Relative: 5.5 % — ABNORMAL HIGH (ref 0.0–5.0)
HCT: 36.8 % (ref 36.0–46.0)
Lymphocytes Relative: 22.5 % (ref 12.0–46.0)
Monocytes Relative: 7.7 % (ref 3.0–12.0)
Neutrophils Relative %: 63.2 % (ref 43.0–77.0)
Platelets: 246 10*3/uL (ref 150.0–400.0)
RDW: 13.9 % (ref 11.5–14.6)
WBC: 6 10*3/uL (ref 4.5–10.5)

## 2012-12-23 LAB — BASIC METABOLIC PANEL
CO2: 27 mEq/L (ref 19–32)
Chloride: 102 mEq/L (ref 96–112)
Glucose, Bld: 94 mg/dL (ref 70–99)
Potassium: 3.4 mEq/L — ABNORMAL LOW (ref 3.5–5.1)
Sodium: 138 mEq/L (ref 135–145)

## 2012-12-23 NOTE — Assessment & Plan Note (Signed)
Well-controlled, no change, labs 

## 2012-12-23 NOTE — Patient Instructions (Addendum)
Continue with the same medicines. Take mucinex 1 tablet twice a day for few days. If the congestion is not better in few days or if you have chest pain or shortness of breath, please let us know  --- Next visit 6 months for a physical, fasting, please make and appointment
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
## 2012-12-23 NOTE — Assessment & Plan Note (Signed)
Complains of chest congestion for "1 or 2 weeks". No dyspnea on exertion, chest pain. Allergies?. Plan: Observation for now, see instructions.

## 2012-12-23 NOTE — Progress Notes (Signed)
  Subjective:    Patient ID: Doris Mcdaniel, female    DOB: May 29, 1936, 77 y.o.   MRN: [id number]  HPI Routine office visit Hypertension, good medication compliance, ambulatory BPs in the 130s. Past history of vertebral fracture, due for a DEXA, patient is reluctant to have it. Today she reports upper chest congestion, sometimes she feels "sofocated" , symptoms started one or 2 weeks ago. Denies dyspnea on exertion or shortness of breath per se. Some nasal congestion and  Mucus pooling in the throat in the morning, this is going on  for much longer than 2 weeks.  Past Medical History  Diagnosis Date  . Hyperlipidemia   . Hypertension   . GERD (gastroesophageal reflux disease)   . Abnormal x-ray of spine     Mild Compression deformity of the the T8 vertebral body, age indertiminate   . Allergic rhinitis   . History of breast cancer 2012    left breast   Past Surgical History  Procedure Laterality Date  . Abdominal hysterectomy    . Oophorectomy      they left a part of the ovarly  . Cholecystectomy  03/2008  . Cataract extraction      B  . Breast surgery      lumpectomy (L)   History   Social History  . Marital Status: Married    Spouse Name: N/A    Number of Children: 2  . Years of Education: N/A   Occupational History  . Not on file.   Social History Main Topics  . Smoking status: Former Smoker    Quit date: 07/08/1973  . Smokeless tobacco: Never Used     Comment: used to smoke 1 PPD  . Alcohol Use: No  . Drug Use: No  . Sexually Active: Not on file   Other Topics Concern  . Not on file   Social History Narrative   Lives w/ husband     Review of Systems No cough or wheezing. Some sneezing. No chest pain or palpitations No nausea, vomiting, diarrhea. GERD symptoms well controlled. No anxiety or depression.     Objective:   Physical Exam BP 108/72  Pulse 58  Temp(Src) 98.1 F (36.7 C) (Oral)  Wt 186 lb (84.369 kg)  BMI 29.12 kg/m2  SpO2  97%  General -- alert, well-developed, No apparent distress.  HEENT -- TMs normal, throat w/o redness, face symmetric and not tender to palpation, Nose with minimal congestion Lungs -- normal respiratory effort, no intercostal retractions, no accessory muscle use, and normal breath sounds.  No wheezing or rhonchi. Heart-- normal rate, regular rhythm, no murmur, and no gallop.   Extremities-- no pretibial edema bilaterally  Psych-- Cognition and judgment appear intact. Alert and cooperative with normal attention span and concentration.  not anxious appearing and not depressed appearing.       Assessment & Plan:

## 2012-12-24 ENCOUNTER — Encounter: Payer: Self-pay | Admitting: Internal Medicine

## 2012-12-25 ENCOUNTER — Encounter: Payer: Self-pay | Admitting: *Deleted

## 2013-02-25 ENCOUNTER — Other Ambulatory Visit: Payer: Self-pay | Admitting: Internal Medicine

## 2013-02-27 NOTE — Telephone Encounter (Signed)
Doris Mcdaniel , pt changing pharmacy

## 2013-02-27 NOTE — Telephone Encounter (Signed)
Refill done on hydrochlorothiazide, lisinopril and Prilosec per protocol. Still awaiting response from Dr. Drue Novel regarding her verapamil.

## 2013-02-27 NOTE — Telephone Encounter (Signed)
Refill done.  

## 2013-02-27 NOTE — Telephone Encounter (Signed)
Ok to refill verapamil?  Last OV 5.13.14 Last filled 6.1.12 for #90 and 3 refills.

## 2013-02-27 NOTE — Telephone Encounter (Signed)
Patient is calling to follow-up on her refill requests. States that she has not had any blood pressure medication for three days.

## 2013-05-04 ENCOUNTER — Ambulatory Visit (INDEPENDENT_AMBULATORY_CARE_PROVIDER_SITE_OTHER)
Admission: RE | Admit: 2013-05-04 | Discharge: 2013-05-04 | Disposition: A | Discharge status: Home / Self Care | Payer: Medicare Other | Source: Ambulatory Visit | Attending: Internal Medicine | Admitting: Internal Medicine

## 2013-05-04 DIAGNOSIS — S22009A Unspecified fracture of unspecified thoracic vertebra, initial encounter for closed fracture: Secondary | ICD-10-CM

## 2013-05-05 ENCOUNTER — Other Ambulatory Visit: Payer: Self-pay | Admitting: Internal Medicine

## 2013-05-05 DIAGNOSIS — Z853 Personal history of malignant neoplasm of breast: Secondary | ICD-10-CM

## 2013-05-07 ENCOUNTER — Telehealth: Payer: Self-pay | Admitting: Internal Medicine

## 2013-05-25 ENCOUNTER — Telehealth: Payer: Self-pay | Admitting: *Deleted

## 2013-05-25 MED ORDER — LISINOPRIL 20 MG PO TABS: ORAL_TABLET | ORAL | Status: DC

## 2013-05-25 NOTE — Telephone Encounter (Signed)
Advise patient, bone density test performed last month was normal. Given her history of vertebral fracture I would like to treat her for "osteoporosis" despite the normal bone density test. Treatment could be oral therapy with Fosamax or similar medication or Reclast which is once a year shot. If she likes to proceed w/ meds  let me know otherwise schedule an appointment to discuss results.

## 2013-05-25 NOTE — Telephone Encounter (Signed)
rx refilled per dr Drue Novel order. DJR

## 2013-05-27 NOTE — Telephone Encounter (Signed)
Pt notified, will wait till her next appointment 06/30/13 to discuss more about medication options. DJR

## 2013-05-29 ENCOUNTER — Ambulatory Visit
Admission: RE | Admit: 2013-05-29 | Discharge: 2013-05-29 | Disposition: A | Discharge status: Home / Self Care | Payer: Medicare Other | Source: Ambulatory Visit | Attending: Internal Medicine | Admitting: Internal Medicine

## 2013-05-29 DIAGNOSIS — Z853 Personal history of malignant neoplasm of breast: Secondary | ICD-10-CM

## 2013-06-29 ENCOUNTER — Telehealth: Payer: Self-pay

## 2013-06-29 NOTE — Telephone Encounter (Signed)
Medication and allergies: reviewed and updated  90 day supply/mail order: na Local pharmacy: [facility]    Immunizations due:  Admin flu vaccine today  A/P:   No changes to FH or PSH pneumonia shot--2007 (completed)  shingles immunization? reports she had shingles at age 77  last colonoscopy 06/2009--Dr Christella Hartigan- negative-next 10 years Tdap--2008 [facility]--10/201--neg Bone Density--05/2013  To Discuss with Provider: Not at this time

## 2013-06-30 ENCOUNTER — Ambulatory Visit (INDEPENDENT_AMBULATORY_CARE_PROVIDER_SITE_OTHER): Payer: Medicare Other | Admitting: Internal Medicine

## 2013-06-30 ENCOUNTER — Encounter: Payer: Self-pay | Admitting: Internal Medicine

## 2013-06-30 VITALS — BP 143/75 | HR 68 | Temp 98.0°F | Ht 67.0 in | Wt 188.0 lb

## 2013-06-30 DIAGNOSIS — I10 Essential (primary) hypertension: Secondary | ICD-10-CM

## 2013-06-30 DIAGNOSIS — K219 Gastro-esophageal reflux disease without esophagitis: Secondary | ICD-10-CM

## 2013-06-30 DIAGNOSIS — E785 Hyperlipidemia, unspecified: Secondary | ICD-10-CM

## 2013-06-30 DIAGNOSIS — Z0000 Encounter for general adult medical examination without abnormal findings: Secondary | ICD-10-CM

## 2013-06-30 DIAGNOSIS — Z23 Encounter for immunization: Secondary | ICD-10-CM

## 2013-06-30 LAB — LIPID PANEL
Cholesterol: 200 mg/dL (ref 0–200)
LDL Cholesterol: 126 mg/dL — ABNORMAL HIGH (ref 0–99)
Triglycerides: 134 mg/dL (ref 0.0–149.0)
VLDL: 26.8 mg/dL (ref 0.0–40.0)

## 2013-06-30 LAB — COMPREHENSIVE METABOLIC PANEL
Albumin: 4 g/dL (ref 3.5–5.2)
Alkaline Phosphatase: 60 U/L (ref 39–117)
BUN: 20 mg/dL (ref 6–23)
CO2: 28 mEq/L (ref 19–32)
GFR: 50.94 mL/min — ABNORMAL LOW (ref >60.00)
Glucose, Bld: 98 mg/dL (ref 70–99)
Total Bilirubin: 0.8 mg/dL (ref 0.3–1.2)

## 2013-06-30 MED ORDER — VERAPAMIL HCL ER 240 MG PO TBCR: 240.0000 mg | EXTENDED_RELEASE_TABLET | Freq: Every day | ORAL | Status: DC

## 2013-06-30 MED ORDER — HYDROCHLOROTHIAZIDE 25 MG PO TABS: ORAL_TABLET | ORAL | Status: DC

## 2013-06-30 MED ORDER — OMEPRAZOLE 20 MG PO CPDR: DELAYED_RELEASE_CAPSULE | ORAL | Status: DC

## 2013-06-30 NOTE — Assessment & Plan Note (Signed)
Mild dyslipidemia, on no medications, + FH of heart disease. Plan: Labs

## 2013-06-30 NOTE — Assessment & Plan Note (Signed)
Td 10-08 flu shot today  pneumonia shot--2007 (completed) shingles immunization--  Declined , see previous entry last PAP years ago, no h/o abnormal PAPs, s/p hysterectomy due to bleeding, married once :  Strongly declined a PAP (she is very low risk)  H/o breast ca, to see oncology soon, last MMG 9-14 (-), breast exam today (-) last colonoscopy 06/2009, negative, next 10 years  diet- exercise discussed

## 2013-06-30 NOTE — Assessment & Plan Note (Signed)
Well controlled with PPIs as needed

## 2013-06-30 NOTE — Assessment & Plan Note (Signed)
Labs No change, well controlled

## 2013-06-30 NOTE — Progress Notes (Signed)
Subjective:    Patient ID: Doris Mcdaniel, female    DOB: [birth year], 77 y.o.   MRN: [id number]  HPI Here for Medicare AWV:  1. Risk factors based on Past M, S, F history: reviewed  2. Physical Activities: ++ yard work, active, takes walks 3. Depression/mood: neg screen 4. Hearing: no problems noted to normal conversation  5. ADL's: totally independent  6. Fall Risk: no h/o falls , see instructions  7. Home Safety: feels safe at home  8.Height, weight, &visual acuity: see VS, had cataract surgery , vision is great  9. Counseling: yes, se below  10. Labs ordered based on risk factors:  11. Referral Coordination, if needed  12. Care Plan, see a/p  13. Cognitive Assessment: motor skills , cognition and memory seem appropiate for age   in addition we've discussed the following issues Hypertension--medication compliance, ambulatory BPs in the 120s Allergies--good compliance with Claritin as needed GERD-sinus symptoms well-controlled with Prilosec as needed  Past Medical History  Diagnosis Date  . Hyperlipidemia   . Hypertension   . GERD (gastroesophageal reflux disease)   . Abnormal x-ray of spine     Mild Compression deformity of the the T8 vertebral body, age indertiminate   . Allergic rhinitis   . History of breast cancer 2012    left breast   Past Surgical History  Procedure Laterality Date  . Abdominal hysterectomy    . Oophorectomy      they left a part of the ovarly  . Cholecystectomy  03/2008  . Cataract extraction      B  . Breast surgery      lumpectomy (L)   History   Social History  . Marital Status: Married    Spouse Name: N/A    Number of Children: 2  . Years of Education: N/A   Occupational History  . Not on file.   Social History Main Topics  . Smoking status: Former Smoker    Quit date: 07/08/1973  . Smokeless tobacco: Never Used     Comment: used to smoke 1 PPD  . Alcohol Use: No  . Drug Use: No  . Sexual Activity: Not on file    Other Topics Concern  . Not on file   Social History Narrative   Lives w/ husband    Family History  Problem Relation Age of Onset  . Colon cancer Neg Hx   . Breast cancer Neg Hx   . Stroke Neg Hx   . Hypertension Neg Hx   . Diabetes Neg Hx   . Heart disease Brother     CABG age ~ 88      Review of Systems No  CP, SOB, lower extremity edema Denies  nausea, vomiting diarrhea Denies  blood in the stools No dysphagia or odynophagia (-) cough, sputum production, (-) wheezing No dysuria, gross hematuria, difficulty urinating   (-) Vag d/c bleeding        Objective:   Physical Exam BP 143/75  Pulse 68  Temp(Src) 98 F (36.7 C)  Ht 5\' 7"  (1.702 m)  Wt 188 lb (85.276 kg)  BMI 29.44 kg/m2  SpO2 98% General -- alert, well-developed, NAD.  Neck --no thyromegaly  Breast-- no dominant mass, skin and nipples normal to inspection on palpation, axillary areas without mass or lymphadenopathy Lungs -- normal respiratory effort, no intercostal retractions, no accessory muscle use, and normal breath sounds.  Heart-- normal rate, regular rhythm, no murmur.  Abdomen-- Not distended, good bowel  sounds,soft, non-tender. No bruit or  mass. Extremities-- no pretibial edema bilaterally  Neurologic--  alert & oriented X3. Speech normal, gait normal, strength normal in all extremities.  Psych-- Cognition and judgment appear intact. Cooperative with normal attention span and concentration. No anxious appearing , no depressed appearing.      Assessment & Plan:

## 2013-06-30 NOTE — Assessment & Plan Note (Addendum)
Bone density test 04-2013 negative, and a history of compression fracture, we discussed Fosamax, ca, vit D, exercise. Declined meds

## 2013-06-30 NOTE — Patient Instructions (Signed)
Get your blood work before you leave  Next visit in 6 months  for a  follow up-- hypertension  No Fasting Please make an appointment     Fall Prevention and Home Safety Falls cause injuries and can affect all age groups. It is possible to use preventive measures to significantly decrease the likelihood of falls. There are many simple measures which can make your home safer and prevent falls. OUTDOORS  Repair cracks and edges of walkways and driveways.  Remove high doorway thresholds.  Trim shrubbery on the main path into your home.  Have good outside lighting.  Clear walkways of tools, rocks, debris, and clutter.  Check that handrails are not broken and are securely fastened. Both sides of steps should have handrails.  Have leaves, snow, and ice cleared regularly.  Use sand or salt on walkways during winter months.  In the garage, clean up grease or oil spills. BATHROOM  Install night lights.  Install grab bars by the toilet and in the tub and shower.  Use non-skid mats or decals in the tub or shower.  Place a plastic non-slip stool in the shower to sit on, if needed.  Keep floors dry and clean up all water on the floor immediately.  Remove soap buildup in the tub or shower on a regular basis.  Secure bath mats with non-slip, double-sided rug tape.  Remove throw rugs and tripping hazards from the floors. BEDROOMS  Install night lights.  Make sure a bedside light is easy to reach.  Do not use oversized bedding.  Keep a telephone by your bedside.  Have a firm chair with side arms to use for getting dressed.  Remove throw rugs and tripping hazards from the floor. KITCHEN  Keep handles on pots and pans turned toward the center of the stove. Use back burners when possible.  Clean up spills quickly and allow time for drying.  Avoid walking on wet floors.  Avoid hot utensils and knives.  Position shelves so they are not too high or low.  Place commonly  used objects within easy reach.  If necessary, use a sturdy step stool with a grab bar when reaching.  Keep electrical cables out of the way.  Do not use floor polish or wax that makes floors slippery. If you must use wax, use non-skid floor wax.  Remove throw rugs and tripping hazards from the floor. STAIRWAYS  Never leave objects on stairs.  Place handrails on both sides of stairways and use them. Fix any loose handrails. Make sure handrails on both sides of the stairways are as long as the stairs.  Check carpeting to make sure it is firmly attached along stairs. Make repairs to worn or loose carpet promptly.  Avoid placing throw rugs at the top or bottom of stairways, or properly secure the rug with carpet tape to prevent slippage. Get rid of throw rugs, if possible.  Have an electrician put in a light switch at the top and bottom of the stairs. OTHER FALL PREVENTION TIPS  Wear low-heel or rubber-soled shoes that are supportive and fit well. Wear closed toe shoes.  When using a stepladder, make sure it is fully opened and both spreaders are firmly locked. Do not climb a closed stepladder.  Add color or contrast paint or tape to grab bars and handrails in your home. Place contrasting color strips on first and last steps.  Learn and use mobility aids as needed. Install an electrical emergency response system.  Turn  Turn on lights to avoid dark areas. Replace light bulbs that burn out immediately. Get light switches that glow.  Arrange furniture to create clear pathways. Keep furniture in the same place.  Firmly attach carpet with non-skid or double-sided tape.  Eliminate uneven floor surfaces.  Select a carpet pattern that does not visually hide the edge of steps.  Be aware of all pets. OTHER HOME SAFETY TIPS  Set the water temperature for 120 F (48.8 C).  Keep emergency numbers on or near the telephone.  Keep smoke detectors on every level of the home and near sleeping  areas. Document Released: 07/20/2002 Document Revised: 01/29/2012 Document Reviewed: 10/19/2011 [employer] Patient Information 2014 [employer].  

## 2013-06-30 NOTE — Progress Notes (Signed)
Pre visit review using our clinic review tool, if applicable. No additional management support is needed unless otherwise documented below in the visit note. 

## 2013-07-03 ENCOUNTER — Encounter: Payer: Self-pay | Admitting: *Deleted

## 2013-08-24 ENCOUNTER — Encounter: Payer: Self-pay | Admitting: Internal Medicine

## 2013-12-28 ENCOUNTER — Encounter: Payer: Self-pay | Admitting: Internal Medicine

## 2013-12-28 ENCOUNTER — Ambulatory Visit (INDEPENDENT_AMBULATORY_CARE_PROVIDER_SITE_OTHER): Payer: Medicare Other | Admitting: Internal Medicine

## 2013-12-28 VITALS — BP 138/83 | HR 65 | Temp 98.4°F | Wt 186.0 lb

## 2013-12-28 DIAGNOSIS — I10 Essential (primary) hypertension: Secondary | ICD-10-CM

## 2013-12-28 LAB — BASIC METABOLIC PANEL
BUN: 21 mg/dL (ref 6–23)
CHLORIDE: 104 meq/L (ref 96–112)
CO2: 28 mEq/L (ref 19–32)
CREATININE: 1.1 mg/dL (ref 0.4–1.2)
Calcium: 8.2 mg/dL — ABNORMAL LOW (ref 8.4–10.5)
GFR: 53.08 mL/min — ABNORMAL LOW (ref >60.00)
Glucose, Bld: 100 mg/dL — ABNORMAL HIGH (ref 70–99)
Potassium: 3.3 mEq/L — ABNORMAL LOW (ref 3.5–5.1)
SODIUM: 140 meq/L (ref 135–145)

## 2013-12-28 MED ORDER — FLUTICASONE PROPIONATE 50 MCG/ACT NA SUSP: 2.0000 | Freq: Every day | NASAL | Status: DC

## 2013-12-28 MED ORDER — VERAPAMIL HCL ER 240 MG PO TBCR: 240.0000 mg | EXTENDED_RELEASE_TABLET | Freq: Every day | ORAL | Status: DC

## 2013-12-28 MED ORDER — HYDROCHLOROTHIAZIDE 25 MG PO TABS: ORAL_TABLET | ORAL | Status: DC

## 2013-12-28 MED ORDER — OMEPRAZOLE 20 MG PO CPDR: DELAYED_RELEASE_CAPSULE | ORAL | Status: DC

## 2013-12-28 MED ORDER — LORATADINE 10 MG PO TABS: 10.0000 mg | ORAL_TABLET | Freq: Every day | ORAL | Status: AC

## 2013-12-28 MED ORDER — ASPIRIN 81 MG PO TABS: 81.0000 mg | ORAL_TABLET | Freq: Every day | ORAL | Status: DC

## 2013-12-28 NOTE — Assessment & Plan Note (Signed)
Well controlled, RF , labs

## 2013-12-28 NOTE — Progress Notes (Signed)
Subjective:    Patient ID: Doris Mcdaniel, female    DOB: 1936/05/29, 78 y.o.   MRN: [id number]  DOS:  12/28/2013 Type of  visit: ROV Allergies not as well controlled as she likes , not using flonase, doesn't like nose sprays HTN-- good med compliance amb BP wnl   ROS No fever, chills  No  CP, SOB  (-) cough, sputum production (-) wheezing, chest congestion   Past Medical History  Diagnosis Date  . Hyperlipidemia   . Hypertension   . GERD (gastroesophageal reflux disease)   . Abnormal x-ray of spine     Mild Compression deformity of the the T8 vertebral body, age indertiminate   . Allergic rhinitis   . History of breast cancer 2012    left breast    Past Surgical History  Procedure Laterality Date  . Abdominal hysterectomy    . Oophorectomy      they left a part of the ovarly  . Cholecystectomy  03/2008  . Cataract extraction      B  . Breast surgery      lumpectomy (L)    History   Social History  . Marital Status: Married    Spouse Name: N/A    Number of Children: 2  . Years of Education: N/A   Occupational History  . Not on file.   Social History Main Topics  . Smoking status: Former Smoker    Quit date: 07/08/1973  . Smokeless tobacco: Never Used     Comment: used to smoke 1 PPD  . Alcohol Use: No  . Drug Use: No  . Sexual Activity: Not on file   Other Topics Concern  . Not on file   Social History Narrative   Lives w/ husband         Medication List       This list is accurate as of: 12/28/13 11:59 PM.  Always use your most recent med list.               aspirin 81 MG tablet  Take 1 tablet (81 mg total) by mouth daily.     fluticasone 50 MCG/ACT nasal spray  Commonly known as:  FLONASE  Place 2 sprays into both nostrils daily.     hydrochlorothiazide 25 MG tablet  Commonly known as:  HYDRODIURIL  TAKE ONE TABLET BY MOUTH EVERY DAY     lisinopril 20 MG tablet  Commonly known as:  PRINIVIL,ZESTRIL  TAKE ONE TABLET BY  MOUTH EVERY DAY     loratadine 10 MG tablet  Commonly known as:  CLARITIN  Take 1 tablet (10 mg total) by mouth daily.     multivitamin capsule  Take 1 capsule by mouth daily.     omeprazole 20 MG capsule  Commonly known as:  PRILOSEC  TAKE ONE CAPSULE BY MOUTH EVERY DAY     verapamil 240 MG CR tablet  Commonly known as:  CALAN-SR  Take 1 tablet (240 mg total) by mouth at bedtime.           Objective:   Physical Exam BP 138/83  Pulse 65  Temp(Src) 98.4 F (36.9 C) (Oral)  Wt 186 lb (84.369 kg)  SpO2 94%  General -- alert, well-developed, NAD.  HEENT-- Not pale. Face symmetric, sinuses not  congested. Lungs -- normal respiratory effort, no intercostal retractions, no accessory muscle use, and normal breath sounds.  Heart-- normal rate, regular rhythm, no murmur.  Extremities-- no pretibial edema  bilaterally  Neurologic--  alert & oriented X3. Speech normal, gait normal, strength normal in all extremities.  Psych-- Cognition and judgment appear intact. Cooperative with normal attention span and concentration. No anxious or depressed appearing.       Assessment & Plan:  Multiple RF done

## 2013-12-28 NOTE — Patient Instructions (Addendum)
Get your blood work before you leave   Next visit is for a physical exam by 06-2014, fasting Please make an appointment    

## 2013-12-29 ENCOUNTER — Telehealth: Payer: Self-pay | Admitting: Internal Medicine

## 2013-12-29 NOTE — Telephone Encounter (Signed)
Relevant patient education mailed to patient.  

## 2013-12-30 ENCOUNTER — Encounter: Payer: Self-pay | Admitting: *Deleted

## 2013-12-30 NOTE — Progress Notes (Signed)
Letter mailed to pt with lab results and a copy of potassium rich foods.

## 2014-03-22 ENCOUNTER — Other Ambulatory Visit: Payer: Self-pay | Admitting: Internal Medicine

## 2014-03-22 NOTE — Telephone Encounter (Signed)
Pt called as well to check on this request.  Pt is out.

## 2014-07-02 ENCOUNTER — Ambulatory Visit (INDEPENDENT_AMBULATORY_CARE_PROVIDER_SITE_OTHER): Payer: Medicare Other

## 2014-07-02 ENCOUNTER — Encounter: Payer: Self-pay | Admitting: Internal Medicine

## 2014-07-02 ENCOUNTER — Ambulatory Visit (INDEPENDENT_AMBULATORY_CARE_PROVIDER_SITE_OTHER): Payer: Medicare Other | Admitting: Internal Medicine

## 2014-07-02 VITALS — BP 164/79 | HR 72 | Temp 97.6°F | Ht 66.0 in | Wt 188.1 lb

## 2014-07-02 DIAGNOSIS — Z23 Encounter for immunization: Secondary | ICD-10-CM

## 2014-07-02 DIAGNOSIS — I10 Essential (primary) hypertension: Secondary | ICD-10-CM

## 2014-07-02 DIAGNOSIS — E785 Hyperlipidemia, unspecified: Secondary | ICD-10-CM

## 2014-07-02 DIAGNOSIS — IMO0001 Reserved for inherently not codable concepts without codable children: Secondary | ICD-10-CM

## 2014-07-02 DIAGNOSIS — Z0000 Encounter for general adult medical examination without abnormal findings: Secondary | ICD-10-CM

## 2014-07-02 DIAGNOSIS — S6992XA Unspecified injury of left wrist, hand and finger(s), initial encounter: Secondary | ICD-10-CM

## 2014-07-02 DIAGNOSIS — M4850XS Collapsed vertebra, not elsewhere classified, site unspecified, sequela of fracture: Secondary | ICD-10-CM

## 2014-07-02 DIAGNOSIS — D0592 Unspecified type of carcinoma in situ of left breast: Secondary | ICD-10-CM

## 2014-07-02 LAB — CBC WITH DIFFERENTIAL/PLATELET
Basophils Absolute: 0.1 10*3/uL (ref 0.0–0.1)
Basophils Relative: 0.7 % (ref 0.0–3.0)
Eosinophils Absolute: 0.3 10*3/uL (ref 0.0–0.7)
Eosinophils Relative: 4 % (ref 0.0–5.0)
HEMATOCRIT: 38.5 % (ref 36.0–46.0)
Hemoglobin: 12.6 g/dL (ref 12.0–15.0)
Lymphocytes Relative: 19.7 % (ref 12.0–46.0)
Lymphs Abs: 1.5 10*3/uL (ref 0.7–4.0)
MCHC: 32.7 g/dL (ref 30.0–36.0)
MCV: 85.4 fl (ref 78.0–100.0)
Monocytes Absolute: 0.6 10*3/uL (ref 0.1–1.0)
Monocytes Relative: 8 % (ref 3.0–12.0)
Neutro Abs: 5.1 10*3/uL (ref 1.4–7.7)
Neutrophils Relative %: 67.6 % (ref 43.0–77.0)
Platelets: 289 10*3/uL (ref 150.0–400.0)
RBC: 4.51 Mil/uL (ref 3.87–5.11)
RDW: 14.2 % (ref 11.5–15.5)
WBC: 7.6 10*3/uL (ref 4.0–10.5)

## 2014-07-02 LAB — LIPID PANEL
Cholesterol: 207 mg/dL — ABNORMAL HIGH (ref 0–200)
HDL: 46.5 mg/dL (ref >39.00)
LDL Cholesterol: 134 mg/dL — ABNORMAL HIGH (ref 0–99)
NonHDL: 160.5
Total CHOL/HDL Ratio: 4
Triglycerides: 131 mg/dL (ref 0.0–149.0)
VLDL: 26.2 mg/dL (ref 0.0–40.0)

## 2014-07-02 LAB — BASIC METABOLIC PANEL
BUN: 21 mg/dL (ref 6–23)
CO2: 27 mEq/L (ref 19–32)
Calcium: 8.8 mg/dL (ref 8.4–10.5)
Chloride: 103 mEq/L (ref 96–112)
Creatinine, Ser: 1.1 mg/dL (ref 0.4–1.2)
GFR: 53.58 mL/min — AB (ref >60.00)
GLUCOSE: 89 mg/dL (ref 70–99)
POTASSIUM: 3.7 meq/L (ref 3.5–5.1)
SODIUM: 138 meq/L (ref 135–145)

## 2014-07-02 NOTE — Progress Notes (Signed)
Pre visit review using our clinic review tool, if applicable. No additional management support is needed unless otherwise documented below in the visit note. 

## 2014-07-02 NOTE — Progress Notes (Signed)
Subjective:    Patient ID: Doris Mcdaniel, female    DOB: Aug 22, 1935, 78 y.o.   MRN: [id number]  DOS:  07/02/2014 Type of visit - description :    Here for Medicare AWV:  1. Risk factors based on Past M, S, F history: reviewed  2. Physical Activities: ++ yard work, active, takes walks 3. Depression/mood: neg screen 4. Hearing: no problems noted to normal conversation  5. ADL's: totally independent , drives  6. Fall Risk: one accidental fall, no syncope  , see instructions  7. Home Safety: feels safe at home  8.Height, weight, &visual acuity: see VS, had cataract surgery , vision is great  9. Counseling: yes, se below  10. Labs ordered based on risk factors:  11. Referral Coordination, if needed  12. Care Plan, see a/p  13. Cognitive Assessment: motor skills , cognition and memory seem appropiate for age  10. Care team update 15. Personalized care printed   in addition we've discussed the following issues Hypertension, good medication compliance, ambulatory BPs within normal High cholesterol, on no medications, she works on portion control but otherwise has  a regular diet. Breast cancer, self breast exam negative, chart reviewed, see assessment and plan GERD, on medications, not an issue at this time Had a minor fall 2 months ago, stop the fall with her left hand, since then the left thumb is hurting to some extent and mostly having trigger finger.  ROS Denies chest pain or difficulty breathing No nausea, vomiting, diarrhea or blood in the stools. No dysuria, gross hematuria or difficulty urinating  Past Medical History  Diagnosis Date  . Hyperlipidemia   . Hypertension   . GERD (gastroesophageal reflux disease)   . Abnormal x-ray of spine     Mild Compression deformity of the the T8 vertebral body, age indertiminate   . Allergic rhinitis   . History of breast cancer 2012    left breast    Past Surgical History  Procedure Laterality Date  .  Abdominal hysterectomy    . Oophorectomy      they left a part of the ovarly  . Cholecystectomy  03/2008  . Cataract extraction      B  . Breast surgery      lumpectomy (L)    History   Social History  . Marital Status: Married    Spouse Name: N/A    Number of Children: 2  . Years of Education: N/A   Occupational History  . retired    Social History Main Topics  . Smoking status: Former Smoker    Quit date: 07/08/1973  . Smokeless tobacco: Never Used     Comment: used to smoke 1 PPD  . Alcohol Use: No  . Drug Use: No  . Sexual Activity: Not on file   Other Topics Concern  . Not on file   Social History Narrative   Lives w/ husband      Family History  Problem Relation Age of Onset  . Colon cancer Neg Hx   . Breast cancer Neg Hx   . Stroke Neg Hx   . Hypertension Neg Hx   . Diabetes Neg Hx   . Heart disease Brother     CABG age ~ 77       Medication List       This list is accurate as of: 07/02/14 11:59 PM.  Always use your most recent med list.  aspirin 81 MG tablet  Take 1 tablet (81 mg total) by mouth daily.     fluticasone 50 MCG/ACT nasal spray  Commonly known as:  FLONASE  Place 2 sprays into both nostrils daily.     hydrochlorothiazide 25 MG tablet  Commonly known as:  HYDRODIURIL  TAKE ONE TABLET BY MOUTH EVERY DAY     lisinopril 20 MG tablet  Commonly known as:  PRINIVIL,ZESTRIL  TAKE ONE TABLET BY MOUTH ONCE DAILY     loratadine 10 MG tablet  Commonly known as:  CLARITIN  Take 1 tablet (10 mg total) by mouth daily.     multivitamin capsule  Take 1 capsule by mouth daily.     omeprazole 20 MG capsule  Commonly known as:  PRILOSEC  TAKE ONE CAPSULE BY MOUTH EVERY DAY     verapamil 240 MG CR tablet  Commonly known as:  CALAN-SR  Take 1 tablet (240 mg total) by mouth at bedtime.           Objective:   Physical Exam  Pulmonary/Chest: She has rales.     BP 164/79 mmHg  Pulse 72  Temp(Src) 97.6 F (36.4  C) (Oral)  Ht 5\' 6"  (1.676 m)  Wt 188 lb 2 oz (85.333 kg)  BMI 30.38 kg/m2  SpO2 97%  General -- alert, well-developed, NAD.  Neck --no thyromegaly  HEENT-- Not pale.  Lungs -- normal respiratory effort, no intercostal retractions, no accessory muscle use, and normal breath sounds.  Heart-- normal rate, regular rhythm, no murmur.  Abdomen-- Not distended, good bowel sounds,soft, non-tender. Extremities-- trace pretibial edema bilaterally ; left thumb with no deformities, swelling. She does have a trigger finger phenomena versus a joint click Neurologic--  alert & oriented X3. Speech normal, gait appropriate for age, strength symmetric and appropriate for age.  Psych-- Cognition and judgment appear intact. Cooperative with normal attention span and concentration. No anxious or depressed appearing.       Assessment & Plan:     High cholesterol, on diet only, check FLP  Left thumb injury, refer to ortho

## 2014-07-02 NOTE — Assessment & Plan Note (Addendum)
Td 10-08 flu shot ---today ,elected regular dose  pneumonia shot--2007  prevnar-- today shingles immunization--  Again declined   No furthert PAPs, see previous entries   Bone density test  (-) 2014, vitamin D checked twice in the past and normal  last colonoscopy 06/2009, negative, next 10 years  diet- exercise discussed

## 2014-07-02 NOTE — Patient Instructions (Signed)
Get your blood work before you leave   Check the  blood pressure   weekly  Be sure your blood pressure is between  145/85  and 110/65.  if it is consistently higher or lower, let me know       Please come back to the office in 6 months for a routine check up  No  fasting      Fall Prevention and Home Safety Falls cause injuries and can affect all age groups. It is possible to use preventive measures to significantly decrease the likelihood of falls. There are many simple measures which can make your home safer and prevent falls. OUTDOORS  Repair cracks and edges of walkways and driveways.  Remove high doorway thresholds.  Trim shrubbery on the main path into your home.  Have good outside lighting.  Clear walkways of tools, rocks, debris, and clutter.  Check that handrails are not broken and are securely fastened. Both sides of steps should have handrails.  Have leaves, snow, and ice cleared regularly.  Use sand or salt on walkways during winter months.  In the garage, clean up grease or oil spills. BATHROOM  Install night lights.  Install grab bars by the toilet and in the tub and shower.  Use non-skid mats or decals in the tub or shower.  Place a plastic non-slip stool in the shower to sit on, if needed.  Keep floors dry and clean up all water on the floor immediately.  Remove soap buildup in the tub or shower on a regular basis.  Secure bath mats with non-slip, double-sided rug tape.  Remove throw rugs and tripping hazards from the floors. BEDROOMS  Install night lights.  Make sure a bedside light is easy to reach.  Do not use oversized bedding.  Keep a telephone by your bedside.  Have a firm chair with side arms to use for getting dressed.  Remove throw rugs and tripping hazards from the floor. KITCHEN  Keep handles on pots and pans turned toward the center of the stove. Use back burners when possible.  Clean up spills quickly and allow time for  drying.  Avoid walking on wet floors.  Avoid hot utensils and knives.  Position shelves so they are not too high or low.  Place commonly used objects within easy reach.  If necessary, use a sturdy step stool with a grab bar when reaching.  Keep electrical cables out of the way.  Do not use floor polish or wax that makes floors slippery. If you must use wax, use non-skid floor wax.  Remove throw rugs and tripping hazards from the floor. STAIRWAYS  Never leave objects on stairs.  Place handrails on both sides of stairways and use them. Fix any loose handrails. Make sure handrails on both sides of the stairways are as long as the stairs.  Check carpeting to make sure it is firmly attached along stairs. Make repairs to worn or loose carpet promptly.  Avoid placing throw rugs at the top or bottom of stairways, or properly secure the rug with carpet tape to prevent slippage. Get rid of throw rugs, if possible.  Have an electrician put in a light switch at the top and bottom of the stairs. OTHER FALL PREVENTION TIPS  Wear low-heel or rubber-soled shoes that are supportive and fit well. Wear closed toe shoes.  When using a stepladder, make sure it is fully opened and both spreaders are firmly locked. Do not climb a closed stepladder.  Add  color or contrast paint or tape to grab bars and handrails in your home. Place contrasting color strips on first and last steps.  Learn and use mobility aids as needed. Install an electrical emergency response system.  Turn on lights to avoid dark areas. Replace light bulbs that burn out immediately. Get light switches that glow.  Arrange furniture to create clear pathways. Keep furniture in the same place.  Firmly attach carpet with non-skid or double-sided tape.  Eliminate uneven floor surfaces.  Select a carpet pattern that does not visually hide the edge of steps.  Be aware of all pets. OTHER HOME SAFETY TIPS  Set the water temperature  for 120 F (48.8 C).  Keep emergency numbers on or near the telephone.  Keep smoke detectors on every level of the home and near sleeping areas. Document Released: 07/20/2002 Document Revised: 01/29/2012 Document Reviewed: 10/19/2011 [facility] Patient Information 2015 [place], [place]. This information is not intended to replace advice given to you by your health care provider. Make sure you discuss any questions you have with your health care provider.       Preventive Care for Adults   Ages 75 years and over  Blood pressure check.** / Every 1 to 2 years.  Lipid and cholesterol check.** / Every 5 years beginning at age 25 years.  Lung cancer screening. / Every year if you are aged 30-80 years and have a 30-pack-year history of smoking and currently smoke or have quit within the past 15 years. Yearly screening is stopped once you have quit smoking for at least 15 years or develop a health problem that would prevent you from having lung cancer treatment.  Clinical breast exam.** / Every year after age 70 years.  BRCA-related cancer risk assessment.** / For women who have family members with a BRCA-related cancer (breast, ovarian, tubal, or peritoneal cancers).  Mammogram.** / Every year beginning at age 49 years and continuing for as long as you are in good health. Consult with your health care provider.  Pap test.** / Every 3 years starting at age 35 years through age 23 or 79 years with 3 consecutive normal Pap tests. Testing can be stopped between 65 and 70 years with 3 consecutive normal Pap tests and no abnormal Pap or HPV tests in the past 10 years.  HPV screening.** / Every 3 years from ages 110 years through ages 73 or 2 years with a history of 3 consecutive normal Pap tests. Testing can be stopped between 65 and 70 years with 3 consecutive normal Pap tests and no abnormal Pap or HPV tests in the past 10 years.  Fecal occult blood test (FOBT) of stool. / Every year beginning  at age 24 years and continuing until age 36 years. You may not need to do this test if you get a colonoscopy every 10 years.  Flexible sigmoidoscopy or colonoscopy.** / Every 5 years for a flexible sigmoidoscopy or every 10 years for a colonoscopy beginning at age 75 years and continuing until age 31 years.  Hepatitis C blood test.** / For all people born from [birth year] through 1965 and any individual with known risks for hepatitis C.  Osteoporosis screening.** / A one-time screening for women ages 97 years and over and women at risk for fractures or osteoporosis.  Skin self-exam. / Monthly.  Influenza vaccine. / Every year.  Tetanus, diphtheria, and acellular pertussis (Tdap/Td) vaccine.** / 1 dose of Td every 10 years.  Varicella vaccine.** / Consult your health care  provider.  Zoster vaccine.** / 1 dose for adults aged 24 years or older.  Pneumococcal 13-valent conjugate (PCV13) vaccine.** / Consult your health care provider.  Pneumococcal polysaccharide (PPSV23) vaccine.** / 1 dose for all adults aged 10 years and older.  Meningococcal vaccine.** / Consult your health care provider.  Hepatitis A vaccine.** / Consult your health care provider.  Hepatitis B vaccine.** / Consult your health care provider.  Haemophilus influenzae type b (Hib) vaccine.** / Consult your health care provider. ** Family history and personal history of risk and conditions may change your health care provider's recommendations. Document Released: 09/25/2001 Document Revised: 12/14/2013 Document Reviewed: 12/25/2010 [facility] Patient Information 2015 [place], [place]. This information is not intended to replace advice given to you by your health care provider. Make sure you discuss any questions you have with your health care provider.

## 2014-07-02 NOTE — Assessment & Plan Note (Signed)
H/o breast ca, per oncology ok to be f/u here. Last MMG 9-14 (-), breast exam today normal? Likely has some scar tissue from previous surgery, to be sure will check a mammogram and left breast ultrasound

## 2014-07-03 NOTE — Assessment & Plan Note (Signed)
Hypertension, Good compliance with lisinopril, verapamil, hydrochlorothiazide. BP today elevated, at home consistently normal Plan: Labs, continue monitoring BPs

## 2014-07-03 NOTE — Addendum Note (Signed)
Addended by: Kathlene November E on: 07/03/2014 11:44 AM   Modules accepted: Level of Service, SmartSet

## 2014-07-29 ENCOUNTER — Ambulatory Visit
Admission: RE | Admit: 2014-07-29 | Discharge: 2014-07-29 | Disposition: A | Discharge status: Home / Self Care | Payer: Medicare Other | Source: Ambulatory Visit | Attending: Internal Medicine | Admitting: Internal Medicine

## 2014-07-29 DIAGNOSIS — D0592 Unspecified type of carcinoma in situ of left breast: Secondary | ICD-10-CM

## 2014-12-31 ENCOUNTER — Ambulatory Visit (INDEPENDENT_AMBULATORY_CARE_PROVIDER_SITE_OTHER): Payer: Medicare Other | Admitting: Internal Medicine

## 2014-12-31 ENCOUNTER — Encounter: Payer: Self-pay | Admitting: Internal Medicine

## 2014-12-31 VITALS — BP 124/82 | HR 67 | Temp 97.5°F | Ht 66.0 in | Wt 189.1 lb

## 2014-12-31 DIAGNOSIS — M2550 Pain in unspecified joint: Secondary | ICD-10-CM | POA: Diagnosis not present

## 2014-12-31 DIAGNOSIS — K219 Gastro-esophageal reflux disease without esophagitis: Secondary | ICD-10-CM | POA: Diagnosis not present

## 2014-12-31 DIAGNOSIS — I10 Essential (primary) hypertension: Secondary | ICD-10-CM | POA: Diagnosis not present

## 2014-12-31 LAB — BASIC METABOLIC PANEL
BUN: 21 mg/dL (ref 6–23)
CALCIUM: 8.7 mg/dL (ref 8.4–10.5)
CO2: 28 meq/L (ref 19–32)
Chloride: 101 mEq/L (ref 96–112)
Creatinine, Ser: 1.03 mg/dL (ref 0.40–1.20)
GFR: 55.31 mL/min — AB (ref >60.00)
Glucose, Bld: 100 mg/dL — ABNORMAL HIGH (ref 70–99)
Potassium: 3.5 mEq/L (ref 3.5–5.1)
SODIUM: 138 meq/L (ref 135–145)

## 2014-12-31 MED ORDER — VERAPAMIL HCL ER 240 MG PO TBCR: 240.0000 mg | EXTENDED_RELEASE_TABLET | Freq: Every day | ORAL | Status: DC

## 2014-12-31 MED ORDER — OMEPRAZOLE 20 MG PO CPDR: 20.0000 mg | DELAYED_RELEASE_CAPSULE | Freq: Every day | ORAL | Status: DC | PRN

## 2014-12-31 MED ORDER — DICLOFENAC SODIUM 1 % TD GEL: 2.0000 g | Freq: Four times a day (QID) | TRANSDERMAL | Status: DC

## 2014-12-31 MED ORDER — LISINOPRIL 20 MG PO TABS: 20.0000 mg | ORAL_TABLET | Freq: Every day | ORAL | Status: DC

## 2014-12-31 MED ORDER — HYDROCHLOROTHIAZIDE 25 MG PO TABS: 25.0000 mg | ORAL_TABLET | Freq: Every day | ORAL | Status: DC

## 2014-12-31 NOTE — Assessment & Plan Note (Addendum)
Presents with pain at the site of the right knee, exam is benign except for changes of DJD. No vascular disease on clinical grounds. Patient requests Voltaren gel, prescription provided. Recommend to see the sports medicine doctor, declined for now

## 2014-12-31 NOTE — Progress Notes (Signed)
Pre visit review using our clinic review tool, if applicable. No additional management support is needed unless otherwise documented below in the visit note. 

## 2014-12-31 NOTE — Progress Notes (Signed)
Subjective:    Patient ID: Doris Mcdaniel, female    DOB: [birth year], 79 y.o.   MRN: [id number]  DOS:  12/31/2014 Type of visit - description : rov Interval history: Hypertension, reports normal ambulatory BPs, needs refills. GERD, needs a refill on PPIs, takes them as needed Complains of leg pain, this is going on for a while, located at the lateral side of the left knee, only when she walks, make her limp. No back pain    Review of Systems No chest pain or difficulty breathing. No claudication No bladder or bowel incontinence  Past Medical History  Diagnosis Date  . Hyperlipidemia   . Hypertension   . GERD (gastroesophageal reflux disease)   . Abnormal x-ray of spine     Mild Compression deformity of the the T8 vertebral body, age indertiminate   . Allergic rhinitis   . History of breast cancer 2012    left breast    Past Surgical History  Procedure Laterality Date  . Abdominal hysterectomy    . Oophorectomy      they left a part of the ovarly  . Cholecystectomy  03/2008  . Cataract extraction      B  . Breast surgery      lumpectomy (L)    History   Social History  . Marital Status: Married    Spouse Name: N/A  . Number of Children: 2  . Years of Education: N/A   Occupational History  . retired    Social History Main Topics  . Smoking status: Former Smoker    Quit date: 07/08/1973  . Smokeless tobacco: Never Used     Comment: used to smoke 1 PPD  . Alcohol Use: No  . Drug Use: No  . Sexual Activity: Not on file   Other Topics Concern  . Not on file   Social History Narrative   Lives w/ husband         Medication List       This list is accurate as of: 12/31/14 11:59 PM.  Always use your most recent med list.               aspirin 81 MG tablet  Take 1 tablet (81 mg total) by mouth daily.     diclofenac sodium 1 % Gel  Commonly known as:  VOLTAREN  Apply 2 g topically 4 (four) times daily.     fluticasone 50 MCG/ACT nasal  spray  Commonly known as:  FLONASE  Place 2 sprays into both nostrils daily.     hydrochlorothiazide 25 MG tablet  Commonly known as:  HYDRODIURIL  Take 1 tablet (25 mg total) by mouth daily.     lisinopril 20 MG tablet  Commonly known as:  PRINIVIL,ZESTRIL  Take 1 tablet (20 mg total) by mouth daily.     loratadine 10 MG tablet  Commonly known as:  CLARITIN  Take 1 tablet (10 mg total) by mouth daily.     multivitamin capsule  Take 1 capsule by mouth daily.     omeprazole 20 MG capsule  Commonly known as:  PRILOSEC  Take 1 capsule (20 mg total) by mouth daily as needed.     verapamil 240 MG CR tablet  Commonly known as:  CALAN-SR  Take 1 tablet (240 mg total) by mouth at bedtime.           Objective:   Physical Exam BP 124/82 mmHg  Pulse 67  Temp(Src) 97.5 F (  36.4 C) (Oral)  Ht 5\' 6"  (1.676 m)  Wt 189 lb 2 oz (85.787 kg)  BMI 30.54 kg/m2  SpO2 96%  General:   Well developed, well nourished . NAD.  HEENT:  Normocephalic . Face symmetric, atraumatic Lungs:  CTA B Normal respiratory effort, no intercostal retractions, no accessory muscle use. Heart: RRR,  no murmur.  No pretibial edema bilaterally  Legs: Normal pedal pulses bilaterally Knees  symmetric, some deformities consistent with DJD, no effusion. Left hip rotation normal Left knee stable. Neurologic:  alert & oriented X3.  Speech normal, gait appropriate for age and unassisted Psych--  Cognition and judgment appear intact.  Cooperative with normal attention span and concentration.  Behavior appropriate. No anxious or depressed appearing.       Assessment & Plan:

## 2014-12-31 NOTE — Assessment & Plan Note (Signed)
Refill PPIs, takes as needed

## 2014-12-31 NOTE — Patient Instructions (Signed)
Get your blood work before you leave    Come back to the office by 06-2015   for a physical exam  Please schedule an appointment at the front desk    Come back fasting        

## 2014-12-31 NOTE — Assessment & Plan Note (Signed)
Well-controlled, refill medications, check a BMP

## 2015-01-17 DIAGNOSIS — H52203 Unspecified astigmatism, bilateral: Secondary | ICD-10-CM | POA: Diagnosis not present

## 2015-01-17 DIAGNOSIS — Z961 Presence of intraocular lens: Secondary | ICD-10-CM | POA: Diagnosis not present

## 2015-07-04 ENCOUNTER — Telehealth: Payer: Self-pay

## 2015-07-04 NOTE — Telephone Encounter (Signed)
Called patient to update chart and to confirm appointment for tomorrow. Left message for call back.

## 2015-07-05 ENCOUNTER — Encounter: Payer: Self-pay | Admitting: Internal Medicine

## 2015-07-05 ENCOUNTER — Ambulatory Visit (INDEPENDENT_AMBULATORY_CARE_PROVIDER_SITE_OTHER): Payer: Medicare Other | Admitting: Internal Medicine

## 2015-07-05 VITALS — BP 122/68 | HR 71 | Temp 97.4°F | Ht 66.0 in | Wt 183.4 lb

## 2015-07-05 DIAGNOSIS — Z23 Encounter for immunization: Secondary | ICD-10-CM | POA: Diagnosis not present

## 2015-07-05 DIAGNOSIS — Z131 Encounter for screening for diabetes mellitus: Secondary | ICD-10-CM | POA: Diagnosis not present

## 2015-07-05 DIAGNOSIS — I10 Essential (primary) hypertension: Secondary | ICD-10-CM | POA: Diagnosis not present

## 2015-07-05 DIAGNOSIS — Z853 Personal history of malignant neoplasm of breast: Secondary | ICD-10-CM

## 2015-07-05 DIAGNOSIS — Z09 Encounter for follow-up examination after completed treatment for conditions other than malignant neoplasm: Secondary | ICD-10-CM

## 2015-07-05 DIAGNOSIS — Z0000 Encounter for general adult medical examination without abnormal findings: Secondary | ICD-10-CM

## 2015-07-05 DIAGNOSIS — E785 Hyperlipidemia, unspecified: Secondary | ICD-10-CM

## 2015-07-05 LAB — VITAMIN B12: Vitamin B-12: 389 pg/mL (ref 211–911)

## 2015-07-05 LAB — LIPID PANEL
Cholesterol: 196 mg/dL (ref 0–200)
HDL: 51.2 mg/dL (ref >39.00)
LDL Cholesterol: 119 mg/dL — ABNORMAL HIGH (ref 0–99)
NONHDL: 144.69
Total CHOL/HDL Ratio: 4
Triglycerides: 126 mg/dL (ref 0.0–149.0)
VLDL: 25.2 mg/dL (ref 0.0–40.0)

## 2015-07-05 LAB — AST: AST: 14 U/L (ref 0–37)

## 2015-07-05 LAB — BASIC METABOLIC PANEL
BUN: 18 mg/dL (ref 6–23)
CALCIUM: 8.8 mg/dL (ref 8.4–10.5)
CO2: 28 meq/L (ref 19–32)
CREATININE: 1.01 mg/dL (ref 0.40–1.20)
Chloride: 101 mEq/L (ref 96–112)
GFR: 56.5 mL/min — ABNORMAL LOW (ref >60.00)
GLUCOSE: 102 mg/dL — AB (ref 70–99)
Potassium: 3.6 mEq/L (ref 3.5–5.1)
Sodium: 139 mEq/L (ref 135–145)

## 2015-07-05 LAB — HEMOGLOBIN A1C: Hgb A1c MFr Bld: 5.6 % (ref 4.6–6.5)

## 2015-07-05 LAB — ALT: ALT: 10 U/L (ref 0–35)

## 2015-07-05 NOTE — Assessment & Plan Note (Addendum)
Td 10-08 pneumonia shot--2007  prevnar-- 2015 shingles immunization--    declined before  Flu shot  Today  No furthert PAPs, see previous entries  H/o breast ca, see a/p  last colonoscopy 06/2009, negative, next 10 years  diet- exercise discussed

## 2015-07-05 NOTE — Patient Instructions (Signed)
Get your blood work before you leave     Next visit  for a physical exam in one year, fasting.       Please schedule an appointment at the front desk        Fall Prevention and [place] cause injuries and can affect all age groups. It is possible to use preventive measures to significantly decrease the likelihood of falls. There are many simple measures which can make your home safer and prevent falls. OUTDOORS  Repair cracks and edges of walkways and driveways.  Remove high doorway thresholds.  Trim shrubbery on the main path into your home.  Have good outside lighting.  Clear walkways of tools, rocks, debris, and clutter.  Check that handrails are not broken and are securely fastened. Both sides of steps should have handrails.  Have leaves, snow, and ice cleared regularly.  Use sand or salt on walkways during winter months.  In the garage, clean up grease or oil spills. BATHROOM  Install night lights.  Install grab bars by the toilet and in the tub and shower.  Use non-skid mats or decals in the tub or shower.  Place a plastic non-slip stool in the shower to sit on, if needed.  Keep floors dry and clean up all water on the floor immediately.  Remove soap buildup in the tub or shower on a regular basis.  Secure bath mats with non-slip, double-sided rug tape.  Remove throw rugs and tripping hazards from the floors. BEDROOMS  Install night lights.  Make sure a bedside light is easy to reach.  Do not use oversized bedding.  Keep a telephone by your bedside.  Have a firm chair with side arms to use for getting dressed.  Remove throw rugs and tripping hazards from the floor. KITCHEN  Keep handles on pots and pans turned toward the center of the stove. Use back burners when possible.  Clean up spills quickly and allow time for drying.  Avoid walking on wet floors.  Avoid hot utensils and knives.  Position shelves so they are not too high or  low.  Place commonly used objects within easy reach.  If necessary, use a sturdy step stool with a grab bar when reaching.  Keep electrical cables out of the way.  Do not use floor polish or wax that makes floors slippery. If you must use wax, use non-skid floor wax.  Remove throw rugs and tripping hazards from the floor. STAIRWAYS  Never leave objects on stairs.  Place handrails on both sides of stairways and use them. Fix any loose handrails. Make sure handrails on both sides of the stairways are as long as the stairs.  Check carpeting to make sure it is firmly attached along stairs. Make repairs to worn or loose carpet promptly.  Avoid placing throw rugs at the top or bottom of stairways, or properly secure the rug with carpet tape to prevent slippage. Get rid of throw rugs, if possible.  Have an electrician put in a light switch at the top and bottom of the stairs. OTHER FALL PREVENTION TIPS  Wear low-heel or rubber-soled shoes that are supportive and fit well. Wear closed toe shoes.  When using a stepladder, make sure it is fully opened and both spreaders are firmly locked. Do not climb a closed stepladder.  Add color or contrast paint or tape to grab bars and handrails in your home. Place contrasting color strips on first and last steps.  Learn and use  mobility aids as needed. Install an electrical emergency response system.  Turn on lights to avoid dark areas. Replace light bulbs that burn out immediately. Get light switches that glow.  Arrange furniture to create clear pathways. Keep furniture in the same place.  Firmly attach carpet with non-skid or double-sided tape.  Eliminate uneven floor surfaces.  Select a carpet pattern that does not visually hide the edge of steps.  Be aware of all pets. OTHER HOME SAFETY TIPS  Set the water temperature for 120 F (48.8 C).  Keep emergency numbers on or near the telephone.  Keep smoke detectors on every level of the  home and near sleeping areas. Document Released: 07/20/2002 Document Revised: 01/29/2012 Document Reviewed: 10/19/2011 [facility] Patient Information 2015 [place], [place]. This information is not intended to replace advice given to you by your health care provider. Make sure you discuss any questions you have with your health care provider.   Preventive Care for Adults Ages 35 and over  Blood pressure check.** / Every 1 to 2 years.  Lipid and cholesterol check.**/ Every 5 years beginning at age 31.  Lung cancer screening. / Every year if you are aged 58-80 years and have a 30-pack-year history of smoking and currently smoke or have quit within the past 15 years. Yearly screening is stopped once you have quit smoking for at least 15 years or develop a health problem that would prevent you from having lung cancer treatment.  Fecal occult blood test (FOBT) of stool. / Every year beginning at age 68 and continuing until age 68. You may not have to do this test if you get a colonoscopy every 10 years.  Flexible sigmoidoscopy** or colonoscopy.** / Every 5 years for a flexible sigmoidoscopy or every 10 years for a colonoscopy beginning at age 11 and continuing until age 26.  Hepatitis C blood test.** / For all people born from [birth year] through 1963 and any individual with known risks for hepatitis C.  Abdominal aortic aneurysm (AAA) screening.** / A one-time screening for ages 79 to 85 years who are current or former smokers.  Skin self-exam. / Monthly.  Influenza vaccine. / Every year.  Tetanus, diphtheria, and acellular pertussis (Tdap/Td) vaccine.** / 1 dose of Td every 10 years.  Varicella vaccine.** / Consult your health care provider.  Zoster vaccine.** / 1 dose for adults aged 84 years or older.  Pneumococcal 13-valent conjugate (PCV13) vaccine.** / Consult your health care provider.  Pneumococcal polysaccharide (PPSV23) vaccine.** / 1 dose for all adults aged 23 years and  older.  Meningococcal vaccine.** / Consult your health care provider.  Hepatitis A vaccine.** / Consult your health care provider.  Hepatitis B vaccine.** / Consult your health care provider.  Haemophilus influenzae type b (Hib) vaccine.** / Consult your health care provider. **Family history and personal history of risk and conditions may change your health care provider's recommendations. Document Released: 09/25/2001 Document Revised: 08/04/2013 Document Reviewed: 12/25/2010 [facility] Patient Information 2015 [place], [place]. This information is not intended to replace advice given to you by your health care provider. Make sure you discuss any questions you have with your health care provider.

## 2015-07-05 NOTE — Progress Notes (Signed)
Pre visit review using our clinic review tool, if applicable. No additional management support is needed unless otherwise documented below in the visit note. 

## 2015-07-05 NOTE — Assessment & Plan Note (Signed)
HTN: Seems well-controlled, check a BMP, continue with present care Hyperlipidemia: On diet control, check labs Breast cancer: Last mammogram negative, breast exam today negative, recheck a mammogram yearly for now. GERD: On PPIs, check a B12  RTC one year

## 2015-07-05 NOTE — Progress Notes (Signed)
Subjective:    Patient ID: Doris Mcdaniel, female    DOB: Apr 04, 1936, 79 y.o.   MRN: [id number]  DOS:  07/05/2015 Type of visit - description :  Here for Medicare AWV: 1. Risk factors based on Past M, S, F history: reviewed 2. Physical Activities: yard work, no routine exercise  3. Depression/mood: neg screening  4. Hearing:  No problems noted or reported  5. ADL's: independent, drives  6. Fall Risk: no recent falls, prevention discussed , see AVS 7. home Safety: does feel safe at home  8. Height, weight, & visual acuity: see VS, sees eye doctor regulalrly, once a year. S/p cataract surgery 9. Counseling: provided 10. Labs ordered based on risk factors: if needed  11. Referral Coordination: if needed 12. Care Plan, see assessment and plan , written personalized plan provided , see AVS 13. Cognitive Assessment: motor skills and cognition appropriate for age 72. Care team updated   15. End-of-life care, has a HC-POA   In addition, today we discussed the following: HTN: Good compliance of medication, ambulatory BPs okay Allergies: Well-controlled with OTCs.    Review of Systems  Constitutional: No fever. No chills. No unexplained wt changes. No unusual sweats  HEENT: No dental problems, no ear discharge, no facial swelling, no voice changes. No eye discharge, no eye  redness , no  intolerance to light   Respiratory: No wheezing , no  difficulty breathing. No cough , no mucus production  Cardiovascular: No CP, no leg swelling , no  Palpitations  GI: no nausea, no vomiting, no diarrhea , no  abdominal pain.  No blood in the stools. No dysphagia, no odynophagia    Endocrine: No polyphagia, no polyuria , no polydipsia  GU: No dysuria, gross hematuria, difficulty urinating. No urinary urgency, no frequency.  Musculoskeletal: Continue with on and off wrist and hand aches, mostly after she does heavy yard work and uses her hands a lot. Uses a brace at night and feels  better  Skin: No change in the color of the skin, palor , no  Rash  Allergic, immunologic: No environmental allergies , no  food allergies  Neurological: No dizziness no  syncope. No headaches. No diplopia, no slurred, no slurred speech, no motor deficits, no facial  Numbness  Hematological: No enlarged lymph nodes, no easy bruising , no unusual bleedings  Psychiatry: No suicidal ideas, no hallucinations, no beavior problems, no confusion.  No unusual/severe anxiety, no depression    Past Medical History  Diagnosis Date  . Hyperlipidemia   . Hypertension   . GERD (gastroesophageal reflux disease)   . Abnormal x-ray of spine     Mild Compression deformity of the the T8 vertebral body, age indertiminate   . Allergic rhinitis   . History of breast cancer 2012    left breast    Past Surgical History  Procedure Laterality Date  . Abdominal hysterectomy    . Oophorectomy      they left a part of the ovarly  . Cholecystectomy  03/2008  . Cataract extraction      B  . Breast surgery      lumpectomy (L)    Social History   Social History  . Marital Status: Married    Spouse Name: N/A  . Number of Children: 2  . Years of Education: N/A   Occupational History  . retired    Social History Main Topics  . Smoking status: Former Smoker    Quit date:  07/08/1973  . Smokeless tobacco: Never Used     Comment: used to smoke 1 PPD  . Alcohol Use: No  . Drug Use: No  . Sexual Activity: Not on file   Other Topics Concern  . Not on file   Social History Narrative   Lives w/ husband . 2 children live near by     Family History  Problem Relation Age of Onset  . Colon cancer Neg Hx   . Breast cancer Neg Hx   . Stroke Neg Hx   . Hypertension Neg Hx   . Diabetes Neg Hx   . Heart disease Brother     CABG age ~ 67       Medication List       This list is accurate as of: 07/05/15  7:50 PM.  Always use your most recent med list.               aspirin 81 MG tablet    Take 1 tablet (81 mg total) by mouth daily.     diclofenac sodium 1 % Gel  Commonly known as:  VOLTAREN  Apply 2 g topically 4 (four) times daily.     hydrochlorothiazide 25 MG tablet  Commonly known as:  HYDRODIURIL  Take 1 tablet (25 mg total) by mouth daily.     lisinopril 20 MG tablet  Commonly known as:  PRINIVIL,ZESTRIL  Take 1 tablet (20 mg total) by mouth daily.     loratadine 10 MG tablet  Commonly known as:  CLARITIN  Take 1 tablet (10 mg total) by mouth daily.     multivitamin capsule  Take 1 capsule by mouth daily.     omeprazole 20 MG capsule  Commonly known as:  PRILOSEC  Take 1 capsule (20 mg total) by mouth daily as needed.     verapamil 240 MG CR tablet  Commonly known as:  CALAN-SR  Take 1 tablet (240 mg total) by mouth at bedtime.           Objective:   Physical Exam BP 122/68 mmHg  Pulse 71  Temp(Src) 97.4 F (36.3 C) (Oral)  Ht 5\' 6"  (1.676 m)  Wt 183 lb 6 oz (83.178 kg)  BMI 29.61 kg/m2  SpO2 98% General:   Well developed, well nourished . NAD.  Neck:   No  thyromegaly , normal carotid pulse HEENT:  Normocephalic . Face symmetric, atraumatic Breast: Well-healed surgical scar on the left, no dominant mass, skin and nipples normal to inspection , axillary areas without mass or lymphadenopathy Lungs:  CTA B Normal respiratory effort, no intercostal retractions, no accessory muscle use. Heart: RRR,  no murmur.  No pretibial edema bilaterally  Abdomen:  Not distended, soft, non-tender. No rebound or rigidity.  Skin: Exposed areas without rash. Not pale. Not jaundice Neurologic:  alert & oriented X3.  Speech normal, gait appropriate for age and unassisted Strength symmetric and appropriate for age.  Psych: Cognition and judgment appear intact.  Cooperative with normal attention span and concentration.  Behavior appropriate. No anxious or depressed appearing.    Assessment & Plan:  Assessment > HTN Hyperlipidemia GERD Breast  cancer, 2012, left, lumpectomy, XRT MSK: --T8  mild compression per x-ray --DEXA 2014: Normal, declined medication  PLAN  HTN: Seems well-controlled, check a BMP, continue with present care Hyperlipidemia: On diet control, check labs Breast cancer: Last mammogram negative, breast exam today negative, recheck a mammogram yearly for now. GERD: On PPIs, check a B12  RTC one year

## 2015-07-11 NOTE — Telephone Encounter (Signed)
Pre VIsit call completed.  

## 2015-09-05 ENCOUNTER — Ambulatory Visit
Admission: RE | Admit: 2015-09-05 | Discharge: 2015-09-05 | Disposition: A | Discharge status: Home / Self Care | Payer: PPO | Source: Ambulatory Visit | Attending: Internal Medicine | Admitting: Internal Medicine

## 2015-09-05 DIAGNOSIS — R928 Other abnormal and inconclusive findings on diagnostic imaging of breast: Secondary | ICD-10-CM | POA: Diagnosis not present

## 2015-10-17 ENCOUNTER — Other Ambulatory Visit: Payer: Self-pay | Admitting: Internal Medicine

## 2016-01-24 ENCOUNTER — Telehealth: Payer: Self-pay | Admitting: Internal Medicine

## 2016-01-24 MED ORDER — HYDROCHLOROTHIAZIDE 25 MG PO TABS: 25.0000 mg | ORAL_TABLET | Freq: Every day | ORAL | Status: DC

## 2016-01-24 MED ORDER — VERAPAMIL HCL ER 240 MG PO TBCR: 240.0000 mg | EXTENDED_RELEASE_TABLET | Freq: Every day | ORAL | Status: DC

## 2016-01-24 MED ORDER — LISINOPRIL 20 MG PO TABS: 20.0000 mg | ORAL_TABLET | Freq: Every day | ORAL | Status: DC

## 2016-01-24 NOTE — Telephone Encounter (Signed)
Pt is requesting a refill on 3 medications.  1. Verapamil  2. Lisinopril 3. hydrochlorothiazide   Pharmacy: [facility], [place]  Pt also would like to know if PCP could provide a year supply? Pt says that provider use to.

## 2016-01-24 NOTE — Telephone Encounter (Signed)
Requested Rx's sent to [facility]. 90 day supply and 3 refills totaling 1 year.

## 2016-03-23 DIAGNOSIS — D3131 Benign neoplasm of right choroid: Secondary | ICD-10-CM | POA: Diagnosis not present

## 2016-03-23 DIAGNOSIS — Z0100 Encounter for examination of eyes and vision without abnormal findings: Secondary | ICD-10-CM | POA: Diagnosis not present

## 2016-03-23 DIAGNOSIS — H35372 Puckering of macula, left eye: Secondary | ICD-10-CM | POA: Diagnosis not present

## 2016-07-09 ENCOUNTER — Ambulatory Visit (INDEPENDENT_AMBULATORY_CARE_PROVIDER_SITE_OTHER): Payer: PPO | Admitting: Internal Medicine

## 2016-07-09 ENCOUNTER — Encounter: Payer: Self-pay | Admitting: Internal Medicine

## 2016-07-09 VITALS — BP 124/78 | HR 72 | Temp 97.9°F | Resp 14 | Ht 66.0 in | Wt 185.4 lb

## 2016-07-09 DIAGNOSIS — Z23 Encounter for immunization: Secondary | ICD-10-CM

## 2016-07-09 DIAGNOSIS — Z0000 Encounter for general adult medical examination without abnormal findings: Secondary | ICD-10-CM | POA: Diagnosis not present

## 2016-07-09 LAB — CBC WITH DIFFERENTIAL/PLATELET
BASOS ABS: 0.1 10*3/uL (ref 0.0–0.1)
BASOS PCT: 0.7 % (ref 0.0–3.0)
EOS ABS: 0.3 10*3/uL (ref 0.0–0.7)
Eosinophils Relative: 3.4 % (ref 0.0–5.0)
HEMATOCRIT: 40.5 % (ref 36.0–46.0)
Hemoglobin: 13.4 g/dL (ref 12.0–15.0)
LYMPHS ABS: 1.5 10*3/uL (ref 0.7–4.0)
LYMPHS PCT: 18.8 % (ref 12.0–46.0)
MCHC: 33.2 g/dL (ref 30.0–36.0)
MCV: 84.4 fl (ref 78.0–100.0)
MONOS PCT: 6.5 % (ref 3.0–12.0)
Monocytes Absolute: 0.5 10*3/uL (ref 0.1–1.0)
NEUTROS ABS: 5.6 10*3/uL (ref 1.4–7.7)
NEUTROS PCT: 70.6 % (ref 43.0–77.0)
PLATELETS: 328 10*3/uL (ref 150.0–400.0)
RBC: 4.81 Mil/uL (ref 3.87–5.11)
RDW: 14.1 % (ref 11.5–15.5)
WBC: 8 10*3/uL (ref 4.0–10.5)

## 2016-07-09 LAB — LIPID PANEL
CHOL/HDL RATIO: 4
CHOLESTEROL: 212 mg/dL — AB (ref 0–200)
HDL: 48.5 mg/dL (ref >39.00)
LDL CALC: 136 mg/dL — AB (ref 0–99)
NonHDL: 163.33
TRIGLYCERIDES: 137 mg/dL (ref 0.0–149.0)
VLDL: 27.4 mg/dL (ref 0.0–40.0)

## 2016-07-09 LAB — BASIC METABOLIC PANEL
BUN: 17 mg/dL (ref 6–23)
CHLORIDE: 101 meq/L (ref 96–112)
CO2: 28 meq/L (ref 19–32)
Calcium: 9.3 mg/dL (ref 8.4–10.5)
Creatinine, Ser: 1.1 mg/dL (ref 0.40–1.20)
GFR: 51.07 mL/min — ABNORMAL LOW (ref >60.00)
Glucose, Bld: 107 mg/dL — ABNORMAL HIGH (ref 70–99)
Potassium: 3.5 mEq/L (ref 3.5–5.1)
SODIUM: 138 meq/L (ref 135–145)

## 2016-07-09 LAB — TSH: TSH: 1.18 u[IU]/mL (ref 0.35–4.50)

## 2016-07-09 MED ORDER — OMEPRAZOLE 20 MG PO CPDR: 20.0000 mg | DELAYED_RELEASE_CAPSULE | Freq: Every day | ORAL | 3 refills | Status: DC | PRN

## 2016-07-09 MED ORDER — VERAPAMIL HCL ER 240 MG PO TBCR: 240.0000 mg | EXTENDED_RELEASE_TABLET | Freq: Every day | ORAL | 3 refills | Status: DC

## 2016-07-09 MED ORDER — LISINOPRIL 20 MG PO TABS: 20.0000 mg | ORAL_TABLET | Freq: Every day | ORAL | 3 refills | Status: DC

## 2016-07-09 MED ORDER — HYDROCHLOROTHIAZIDE 25 MG PO TABS: 25.0000 mg | ORAL_TABLET | Freq: Every day | ORAL | 3 refills | Status: DC

## 2016-07-09 NOTE — Assessment & Plan Note (Addendum)
Td 10-08; pneumonia shot--2007 ; prevnar-- 2015;  shingles immunization--    declined before  Flu shot  Today  No furthert PAPs, see previous entries  H/o breast ca, see a/p  last colonoscopy 06/2009, negative, next 10 years Labs: BMP, FLP, CBC and TSH diet- exercise discussed  Counseling about healthcare power of attorney and fall prevention. To do Medicare wellness exam if so desired

## 2016-07-09 NOTE — Progress Notes (Signed)
Pre visit review using our clinic review tool, if applicable. No additional management support is needed unless otherwise documented below in the visit note. 

## 2016-07-09 NOTE — Assessment & Plan Note (Signed)
HTN: Seems control, continue calan, lisinopril and hydrochlorothiazide. Labs. Hyperlipidemia: Diet control, check FLP History of breast cancer: Breast exam today (-) , next mammogram 08-2016. GERD: Refill medications DJD: Using [employer] successfully. RTC one year

## 2016-07-09 NOTE — Patient Instructions (Addendum)
Get your blood work before you leave    Think about having a health care power of attorney  Consider schedule a visit with one of our registered nurse for a Medicare wellness exam.  Next visit in one year  Next mammogram by January 2018, if the don't call you bout it please let me know   Fall Prevention and Home Safety Falls cause injuries and can affect all age groups. It is possible to use preventive measures to significantly decrease the likelihood of falls. There are many simple measures which can make your home safer and prevent falls. OUTDOORS  Repair cracks and edges of walkways and driveways.  Remove high doorway thresholds.  Trim shrubbery on the main path into your home.  Have good outside lighting.  Clear walkways of tools, rocks, debris, and clutter.  Check that handrails are not broken and are securely fastened. Both sides of steps should have handrails.  Have leaves, snow, and ice cleared regularly.  Use sand or salt on walkways during winter months.  In the garage, clean up grease or oil spills. BATHROOM  Install night lights.  Install grab bars by the toilet and in the tub and shower.  Use non-skid mats or decals in the tub or shower.  Place a plastic non-slip stool in the shower to sit on, if needed.  Keep floors dry and clean up all water on the floor immediately.  Remove soap buildup in the tub or shower on a regular basis.  Secure bath mats with non-slip, double-sided rug tape.  Remove throw rugs and tripping hazards from the floors. BEDROOMS  Install night lights.  Make sure a bedside light is easy to reach.  Do not use oversized bedding.  Keep a telephone by your bedside.  Have a firm chair with side arms to use for getting dressed.  Remove throw rugs and tripping hazards from the floor. KITCHEN  Keep handles on pots and pans turned toward the center of the stove. Use back burners when possible.  Clean up spills quickly and allow  time for drying.  Avoid walking on wet floors.  Avoid hot utensils and knives.  Position shelves so they are not too high or low.  Place commonly used objects within easy reach.  If necessary, use a sturdy step stool with a grab bar when reaching.  Keep electrical cables out of the way.  Do not use floor polish or wax that makes floors slippery. If you must use wax, use non-skid floor wax.  Remove throw rugs and tripping hazards from the floor. STAIRWAYS  Never leave objects on stairs.  Place handrails on both sides of stairways and use them. Fix any loose handrails. Make sure handrails on both sides of the stairways are as long as the stairs.  Check carpeting to make sure it is firmly attached along stairs. Make repairs to worn or loose carpet promptly.  Avoid placing throw rugs at the top or bottom of stairways, or properly secure the rug with carpet tape to prevent slippage. Get rid of throw rugs, if possible.  Have an electrician put in a light switch at the top and bottom of the stairs. OTHER FALL PREVENTION TIPS  Wear low-heel or rubber-soled shoes that are supportive and fit well. Wear closed toe shoes.  When using a stepladder, make sure it is fully opened and both spreaders are firmly locked. Do not climb a closed stepladder.  Add color or contrast paint or tape to grab bars and  handrails in your home. Place contrasting color strips on first and last steps.  Learn and use mobility aids as needed. Install an electrical emergency response system.  Turn on lights to avoid dark areas. Replace light bulbs that burn out immediately. Get light switches that glow.  Arrange furniture to create clear pathways. Keep furniture in the same place.  Firmly attach carpet with non-skid or double-sided tape.  Eliminate uneven floor surfaces.  Select a carpet pattern that does not visually hide the edge of steps.  Be aware of all pets. OTHER HOME SAFETY TIPS  Set the water  temperature for 120 F (48.8 C).  Keep emergency numbers on or near the telephone.  Keep smoke detectors on every level of the home and near sleeping areas. Document Released: 07/20/2002 Document Revised: 01/29/2012 Document Reviewed: 10/19/2011 [facility] Patient Information 2015 [place], [place]. This information is not intended to replace advice given to you by your health care provider. Make sure you discuss any questions you have with your health care provider.   Preventive Care for Adults Ages 80 and over  Blood pressure check.** / Every 1 to 2 years.  Lipid and cholesterol check.**/ Every 5 years beginning at age 1.  Lung cancer screening. / Every year if you are aged 38-80 years and have a 30-pack-year history of smoking and currently smoke or have quit within the past 15 years. Yearly screening is stopped once you have quit smoking for at least 15 years or develop a health problem that would prevent you from having lung cancer treatment.  Fecal occult blood test (FOBT) of stool. / Every year beginning at age 52 and continuing until age 74. You may not have to do this test if you get a colonoscopy every 10 years.  Flexible sigmoidoscopy** or colonoscopy.** / Every 5 years for a flexible sigmoidoscopy or every 10 years for a colonoscopy beginning at age 43 and continuing until age 69.  Hepatitis C blood test.** / For all people born from [birth year] through 1963 and any individual with known risks for hepatitis C.  Abdominal aortic aneurysm (AAA) screening.** / A one-time screening for ages 38 to 33 years who are current or former smokers.  Skin self-exam. / Monthly.  Influenza vaccine. / Every year.  Tetanus, diphtheria, and acellular pertussis (Tdap/Td) vaccine.** / 1 dose of Td every 10 years.  Varicella vaccine.** / Consult your health care provider.  Zoster vaccine.** / 1 dose for adults aged 32 years or older.  Pneumococcal 13-valent conjugate (PCV13) vaccine.** / Consult  your health care provider.  Pneumococcal polysaccharide (PPSV23) vaccine.** / 1 dose for all adults aged 24 years and older.  Meningococcal vaccine.** / Consult your health care provider.  Hepatitis A vaccine.** / Consult your health care provider.  Hepatitis B vaccine.** / Consult your health care provider.  Haemophilus influenzae type b (Hib) vaccine.** / Consult your health care provider. **Family history and personal history of risk and conditions may change your health care provider's recommendations. Document Released: 09/25/2001 Document Revised: 08/04/2013 Document Reviewed: 12/25/2010 [facility] Patient Information 2015 [place], [place]. This information is not intended to replace advice given to you by your health care provider. Make sure you discuss any questions you have with your health care provider.

## 2016-07-09 NOTE — Progress Notes (Signed)
Subjective:    Patient ID: Doris Mcdaniel, female    DOB: 26-Nov-1935, 80 y.o.   MRN: [id number]  DOS:  07/09/2016 Type of visit - description : Complete physical exam Interval history: No major concerns, in general feeling really well. Ambulatory BPs 120/60, good medication compliance   Review of Systems Constitutional: No fever. No chills. No unexplained wt changes. No unusual sweats  HEENT: No dental problems, no ear discharge, no facial swelling, no voice changes. No eye discharge, no eye  redness , no  intolerance to light   Respiratory: No wheezing , no  difficulty breathing. No cough , no mucus production  Cardiovascular: No CP, no leg swelling , no  Palpitations  GI: no nausea, no vomiting, no diarrhea , no  abdominal pain.  No blood in the stools. No dysphagia, no odynophagia    Endocrine: No polyphagia, no polyuria , no polydipsia  GU: No dysuria, gross hematuria, difficulty urinating. No urinary urgency, no frequency.  Musculoskeletal: No joint swellings or unusual aches or pains  Skin: No change in the color of the skin, palor , no  Rash  Allergic, immunologic: No environmental allergies , no  food allergies  Neurological: No dizziness no  syncope. No headaches. No diplopia, no slurred, no slurred speech, no motor deficits, no facial  Numbness  Hematological: No enlarged lymph nodes, no easy bruising , no unusual bleedings  Psychiatry: No suicidal ideas, no hallucinations, no beavior problems, no confusion.  No unusual/severe anxiety, no depression   Past Medical History:  Diagnosis Date  . Abnormal x-ray of spine    Mild Compression deformity of the the T8 vertebral body, age indertiminate   . Allergic rhinitis   . GERD (gastroesophageal reflux disease)   . History of breast cancer 2012   left breast  . Hyperlipidemia   . Hypertension     Past Surgical History:  Procedure Laterality Date  . ABDOMINAL HYSTERECTOMY    . BREAST SURGERY     lumpectomy (L)  . CATARACT EXTRACTION     B  . CHOLECYSTECTOMY  03/2008  . OOPHORECTOMY     they left a part of the ovarly    Social History   Social History  . Marital status: Married    Spouse name: N/A  . Number of children: 2  . Years of education: N/A   Occupational History  . retired    Social History Main Topics  . Smoking status: Former Smoker    Quit date: 07/08/1973  . Smokeless tobacco: Never Used     Comment: used to smoke 1 PPD  . Alcohol use No  . Drug use: No  . Sexual activity: Not on file   Other Topics Concern  . Not on file   Social History Narrative   Lives w/ husband . 2 children live near by     Family History  Problem Relation Age of Onset  . Heart disease Brother     CABG age ~ 72  . Colon cancer Neg Hx   . Breast cancer Neg Hx   . Stroke Neg Hx   . Hypertension Neg Hx   . Diabetes Neg Hx        Medication List       Accurate as of 07/09/16  8:54 PM. Always use your most recent med list.          aspirin 81 MG tablet Take 1 tablet (81 mg total) by mouth daily.   hydrochlorothiazide  25 MG tablet Commonly known as:  HYDRODIURIL Take 1 tablet (25 mg total) by mouth daily.   lisinopril 20 MG tablet Commonly known as:  PRINIVIL,ZESTRIL Take 1 tablet (20 mg total) by mouth daily.   loratadine 10 MG tablet Commonly known as:  CLARITIN Take 1 tablet (10 mg total) by mouth daily.   multivitamin capsule Take 1 capsule by mouth daily.   omeprazole 20 MG capsule Commonly known as:  PRILOSEC Take 1 capsule (20 mg total) by mouth daily as needed.   verapamil 240 MG CR tablet Commonly known as:  CALAN-SR Take 1 tablet (240 mg total) by mouth at bedtime.          Objective:   Physical Exam BP 124/78 (BP Location: Right Arm, Patient Position: Sitting, Cuff Size: Normal)   Pulse 72   Temp 97.9 F (36.6 C) (Oral)   Resp 14   Ht 5\' 6"  (1.676 m)   Wt 185 lb 6 oz (84.1 kg)   SpO2 98%   BMI 29.92 kg/m   General:   Well  developed, well nourished . NAD.  Breast: no dominant mass, skin and nipples normal to inspection on palpation, axillary areas without mass or lymphadenopathy HEENT:  Normocephalic . Face symmetric, atraumatic Lungs:  CTA B Normal respiratory effort, no intercostal retractions, no accessory muscle use. Heart: RRR,  no murmur.  No pretibial edema bilaterally  Abdomen:  Not distended, soft, non-tender. No rebound or rigidity.   Skin: Exposed areas without rash. Not pale. Not jaundice Neurologic:  alert & oriented X3.  Speech normal, gait appropriate for age and unassisted Strength symmetric and appropriate for age.  Psych: Cognition and judgment appear intact.  Cooperative with normal attention span and concentration.  Behavior appropriate. No anxious or depressed appearing.    Assessment & Plan:   Assessment > HTN Hyperlipidemia GERD DJD  Breast cancer, 2012, left, lumpectomy, XRT. Does not see oncology regularly  MSK: --T8  mild compression per x-ray --DEXA 2014: Normal, declined medication. Vitamin D normal when checked  PLAN HTN: Seems control, continue calan, lisinopril and hydrochlorothiazide. Labs. Hyperlipidemia: Diet control, check FLP History of breast cancer: Breast exam today (-) , next mammogram 08-2016. GERD: Refill medications DJD: Using Blue Emu successfully. RTC one year

## 2016-11-30 ENCOUNTER — Other Ambulatory Visit: Payer: Self-pay | Admitting: Internal Medicine

## 2016-11-30 DIAGNOSIS — Z9889 Other specified postprocedural states: Secondary | ICD-10-CM

## 2016-12-10 ENCOUNTER — Ambulatory Visit
Admission: RE | Admit: 2016-12-10 | Discharge: 2016-12-10 | Disposition: A | Discharge status: Home / Self Care | Payer: PPO | Source: Ambulatory Visit | Attending: Internal Medicine | Admitting: Internal Medicine

## 2016-12-10 DIAGNOSIS — Z9889 Other specified postprocedural states: Secondary | ICD-10-CM

## 2016-12-10 DIAGNOSIS — R928 Other abnormal and inconclusive findings on diagnostic imaging of breast: Secondary | ICD-10-CM | POA: Diagnosis not present

## 2016-12-10 HISTORY — DX: Personal history of irradiation: Z92.3

## 2017-01-08 ENCOUNTER — Ambulatory Visit: Payer: Self-pay | Admitting: *Deleted

## 2017-01-16 NOTE — Progress Notes (Addendum)
Subjective:   Doris Mcdaniel is a 81 y.o. female who presents for Medicare Annual (Subsequent) preventive examination.  Review of Systems:  No ROS.  Medicare Wellness Visit.  Cardiac Risk Factors include: advanced age (>17men, >52 women);hypertension  Sleep patterns: no sleep issues, gets up 2 times nightly to void.   Home Safety/Smoke Alarms: Feels safe in home. Smoke alarms in place. Security system and cameras in place. Living environment; residence and Firearm Safety: Lives w/ husband. 1-story house/ trailer, firearms stored safely. Seat Belt Safety/Bike Helmet: Wears seat belt.   Counseling:   Eye Exam- Follows w/ Dr. [facility]- Full dentures upper and lower. Does not follow w/ dentist regularly.  Female:   Pap- N/A, hysterectomy       Mammo- last 12/10/16. BI-RADS CATEGORY  2: Benign.      Dexa scan- last 05/04/13. Normal.         CCS- last 07/05/09.      Objective:     Vitals: BP 132/62   Pulse 67   Ht 5\' 6"  (1.676 m)   Wt 189 lb 9.6 oz (86 kg)   SpO2 98%   BMI 30.60 kg/m   Body mass index is 30.6 kg/m.   Tobacco History  Smoking Status  . Former Smoker  . Quit date: 07/08/1973  Smokeless Tobacco  . Never Used    Comment: used to smoke 1 PPD     Counseling given: Not Answered   Past Medical History:  Diagnosis Date  . Abnormal x-ray of spine    Mild Compression deformity of the the T8 vertebral body, age indertiminate   . Allergic rhinitis   . GERD (gastroesophageal reflux disease)   . History of breast cancer 2012   left breast  . Hyperlipidemia   . Hypertension   . Personal history of radiation therapy    Past Surgical History:  Procedure Laterality Date  . ABDOMINAL HYSTERECTOMY    . BREAST BIOPSY Left 01/24/2011  . BREAST LUMPECTOMY Left 02/21/2011  . BREAST SURGERY     lumpectomy (L)  . CATARACT EXTRACTION     B  . CHOLECYSTECTOMY  03/2008  . OOPHORECTOMY     they left a part of the ovarly   Family History  Problem  Relation Age of Onset  . Heart disease Brother        CABG age ~ 71  . Colon cancer Neg Hx   . Breast cancer Neg Hx   . Stroke Neg Hx   . Hypertension Neg Hx   . Diabetes Neg Hx    History  Sexual Activity  . Sexual activity: Not Currently    Outpatient Encounter Prescriptions as of 01/17/2017  Medication Sig  . aspirin 81 MG tablet Take 1 tablet (81 mg total) by mouth daily.  . hydrochlorothiazide (HYDRODIURIL) 25 MG tablet Take 1 tablet (25 mg total) by mouth daily.  Marland Kitchen lisinopril (PRINIVIL,ZESTRIL) 20 MG tablet Take 1 tablet (20 mg total) by mouth daily.  Marland Kitchen loratadine (CLARITIN) 10 MG tablet Take 1 tablet (10 mg total) by mouth daily.  . Multiple Vitamin (MULTIVITAMIN) capsule Take 1 capsule by mouth daily.    Marland Kitchen omeprazole (PRILOSEC) 20 MG capsule Take 1 capsule (20 mg total) by mouth daily as needed.  . verapamil (CALAN-SR) 240 MG CR tablet Take 1 tablet (240 mg total) by mouth at bedtime.   No facility-administered encounter medications on file as of 01/17/2017.     Activities of Daily Living In your  present state of health, do you have any difficulty performing the following activities: 01/17/2017 07/09/2016  Hearing? N N  Vision? N N  Difficulty concentrating or making decisions? N N  Walking or climbing stairs? N N  Dressing or bathing? N N  Doing errands, shopping? N N  Preparing Food and eating ? N -  Using the Toilet? N -  In the past six months, have you accidently leaked urine? N -  Do you have problems with loss of bowel control? N -  Managing your Medications? N -  Managing your Finances? N -  Housekeeping or managing your Housekeeping? N -  Some recent data might be hidden    Patient Care Team: Colon Branch, MD as PCP - General Luberta Mutter, MD as Consulting Physician (Ophthalmology)    Assessment:    Physical assessment deferred to PCP.  Exercise Activities and Dietary recommendations Current Exercise Habits: Home exercise routine, Type of exercise:  walking;Other - see comments (push mowing the lawn)  Diet (meal preparation, eat out, water intake, caffeinated beverages, dairy products, fruits and vegetables): in general, a "healthy" diet  , well balanced, on average, 3 meals per day. Drinks water and sweet tea and regular Pepsi. Breakfast: toast or oatmeal Lunch: sandwich or leftovers  Dinner: varies. Last night was salmon, creamed potatoes, slaw, corn  Goals    . Healthy Lifestyle          Eat heart healthy diet (full of fruits, vegetables, whole grains, lean protein, water--limit salt, fat, and sugar intake) and increase physical activity as tolerated. Continue doing brain stimulating activities (puzzles, reading, adult coloring books, staying active) to keep memory sharp.       Fall Risk Fall Risk  01/17/2017 07/09/2016 07/05/2015 07/02/2014 06/30/2013  Falls in the past year? No No No No No   Depression Screen PHQ 2/9 Scores 01/17/2017 07/09/2016 07/05/2015 07/02/2014  PHQ - 2 Score 1 0 0 0  PHQ- 9 Score - - - -     Cognitive Function MMSE - Mini Mental State Exam 01/17/2017  Orientation to time 5  Orientation to Place 5  Registration 3  Attention/ Calculation 5  Recall 2  Language- name 2 objects 2  Language- repeat 1  Language- follow 3 step command 3  Language- read & follow direction 1  Write a sentence 1  Copy design 0  Total score 28        Immunization History  Administered Date(s) Administered  . Influenza Split 07/09/2011, 06/24/2012  . Influenza Whole 05/05/2008, 05/06/2009, 07/12/2010  . Influenza, High Dose Seasonal PF 06/30/2013, 07/05/2015, 07/09/2016  . Influenza,inj,Quad PF,36+ Mos 07/02/2014  . Pneumococcal Conjugate-13 07/02/2014  . Pneumococcal Polysaccharide-23 07/13/2006  . Td 05/21/2007   Screening Tests Health Maintenance  Topic Date Due  . INFLUENZA VACCINE  03/13/2017  . TETANUS/TDAP  05/20/2017  . MAMMOGRAM  12/10/2017  . DEXA SCAN  Completed  . PNA vac Low Risk Adult  Completed       Plan:    Follow-up w/ PCP as scheduled for CPE.  Bring a copy of your advance directives to your next office visit.   I have personally reviewed and noted the following in the patient's chart:   . Medical and social history . Use of alcohol, tobacco or illicit drugs  . Current medications and supplements . Functional ability and status . Nutritional status . Physical activity . Advanced directives . List of other physicians . Vitals . Screenings to include cognitive,  depression, and falls . Referrals and appointments  In addition, I have reviewed and discussed with patient certain preventive protocols, quality metrics, and best practice recommendations. A written personalized care plan for preventive services as well as general preventive health recommendations were provided to patient.     Doris YOTT, RN  01/17/2017  Kathlene November, MD

## 2017-01-17 ENCOUNTER — Encounter: Payer: Self-pay | Admitting: *Deleted

## 2017-01-17 ENCOUNTER — Ambulatory Visit (INDEPENDENT_AMBULATORY_CARE_PROVIDER_SITE_OTHER): Payer: PPO | Admitting: *Deleted

## 2017-01-17 VITALS — BP 132/62 | HR 67 | Ht 66.0 in | Wt 189.6 lb

## 2017-01-17 DIAGNOSIS — Z0000 Encounter for general adult medical examination without abnormal findings: Secondary | ICD-10-CM | POA: Diagnosis not present

## 2017-01-17 DIAGNOSIS — Z780 Asymptomatic menopausal state: Secondary | ICD-10-CM

## 2017-01-17 NOTE — Patient Instructions (Addendum)
Doris Mcdaniel , Thank you for taking time to come for your Medicare Wellness Visit. I appreciate your ongoing commitment to your health goals. Please review the following plan we discussed and let me know if I can assist you in the future.   Bring a copy of your advance directives to your next office visit.  These are the goals we discussed: Goals    . Healthy Lifestyle          Eat heart healthy diet (full of fruits, vegetables, whole grains, lean protein, water--limit salt, fat, and sugar intake) and increase physical activity as tolerated. Continue doing brain stimulating activities (puzzles, reading, adult coloring books, staying active) to keep memory sharp.        This is a list of the screening recommended for you and due dates:  Health Maintenance  Topic Date Due  . Flu Shot  03/13/2017  . Tetanus Vaccine  05/20/2017  . Mammogram  12/10/2017  . DEXA scan (bone density measurement)  Completed  . Pneumonia vaccines  Completed    Preventive Care 6 Years and Older, Female Preventive care refers to lifestyle choices and visits with your health care provider that can promote health and wellness. What does preventive care include?  A yearly physical exam. This is also called an annual well check.  Dental exams once or twice a year.  Routine eye exams. Ask your health care provider how often you should have your eyes checked.  Personal lifestyle choices, including: ? Daily care of your teeth and gums. ? Regular physical activity. ? Eating a healthy diet. ? Avoiding tobacco and drug use. ? Limiting alcohol use. ? Practicing safe sex. ? Taking low-dose aspirin every day. ? Taking vitamin and mineral supplements as recommended by your health care provider. What happens during an annual well check? The services and screenings done by your health care provider during your annual well check will depend on your age, overall health, lifestyle risk factors, and family history  of disease. Counseling Your health care provider may ask you questions about your:  Alcohol use.  Tobacco use.  Drug use.  Emotional well-being.  Home and relationship well-being.  Sexual activity.  Eating habits.  History of falls.  Memory and ability to understand (cognition).  Work and work Statistician.  Reproductive health.  Screening You may have the following tests or measurements:  Height, weight, and BMI.  Blood pressure.  Lipid and cholesterol levels. These may be checked every 5 years, or more frequently if you are over 87 years old.  Skin check.  Lung cancer screening. You may have this screening every year starting at age 44 if you have a 30-pack-year history of smoking and currently smoke or have quit within the past 15 years.  Fecal occult blood test (FOBT) of the stool. You may have this test every year starting at age 65.  Flexible sigmoidoscopy or colonoscopy. You may have a sigmoidoscopy every 5 years or a colonoscopy every 10 years starting at age 68.  Hepatitis C blood test.  Hepatitis B blood test.  Sexually transmitted disease (STD) testing.  Diabetes screening. This is done by checking your blood sugar (glucose) after you have not eaten for a while (fasting). You may have this done every 1-3 years.  Bone density scan. This is done to screen for osteoporosis. You may have this done starting at age 6.  Mammogram. This may be done every 1-2 years. Talk to your health care provider about how often  you should have regular mammograms.  Talk with your health care provider about your test results, treatment options, and if necessary, the need for more tests. Vaccines Your health care provider may recommend certain vaccines, such as:  Influenza vaccine. This is recommended every year.  Tetanus, diphtheria, and acellular pertussis (Tdap, Td) vaccine. You may need a Td booster every 10 years.  Varicella vaccine. You may need this if you have  not been vaccinated.  Zoster vaccine. You may need this after age 55.  Measles, mumps, and rubella (MMR) vaccine. You may need at least one dose of MMR if you were born in 1957 or later. You may also need a second dose.  Pneumococcal 13-valent conjugate (PCV13) vaccine. One dose is recommended after age 2.  Pneumococcal polysaccharide (PPSV23) vaccine. One dose is recommended after age 66.  Meningococcal vaccine. You may need this if you have certain conditions.  Hepatitis A vaccine. You may need this if you have certain conditions or if you travel or work in places where you may be exposed to hepatitis A.  Hepatitis B vaccine. You may need this if you have certain conditions or if you travel or work in places where you may be exposed to hepatitis B.  Haemophilus influenzae type b (Hib) vaccine. You may need this if you have certain conditions.  Talk to your health care provider about which screenings and vaccines you need and how often you need them. This information is not intended to replace advice given to you by your health care provider. Make sure you discuss any questions you have with your health care provider. Document Released: 08/26/2015 Document Revised: 04/18/2016 Document Reviewed: 05/31/2015 Elsevier Interactive Patient Education  2017 [employer].

## 2017-01-23 ENCOUNTER — Ambulatory Visit (HOSPITAL_BASED_OUTPATIENT_CLINIC_OR_DEPARTMENT_OTHER)
Admission: RE | Admit: 2017-01-23 | Discharge: 2017-01-23 | Disposition: A | Discharge status: Home / Self Care | Payer: PPO | Source: Ambulatory Visit | Attending: Internal Medicine | Admitting: Internal Medicine

## 2017-01-23 DIAGNOSIS — Z780 Asymptomatic menopausal state: Secondary | ICD-10-CM | POA: Diagnosis not present

## 2017-01-23 DIAGNOSIS — Z13820 Encounter for screening for osteoporosis: Secondary | ICD-10-CM | POA: Diagnosis not present

## 2017-01-23 DIAGNOSIS — E2839 Other primary ovarian failure: Secondary | ICD-10-CM | POA: Diagnosis not present

## 2017-03-29 DIAGNOSIS — H353131 Nonexudative age-related macular degeneration, bilateral, early dry stage: Secondary | ICD-10-CM | POA: Diagnosis not present

## 2017-03-29 DIAGNOSIS — D3131 Benign neoplasm of right choroid: Secondary | ICD-10-CM | POA: Diagnosis not present

## 2017-03-29 DIAGNOSIS — H35372 Puckering of macula, left eye: Secondary | ICD-10-CM | POA: Diagnosis not present

## 2017-03-29 DIAGNOSIS — H52203 Unspecified astigmatism, bilateral: Secondary | ICD-10-CM | POA: Diagnosis not present

## 2017-07-11 ENCOUNTER — Encounter: Payer: Self-pay | Admitting: Internal Medicine

## 2017-07-11 ENCOUNTER — Ambulatory Visit (INDEPENDENT_AMBULATORY_CARE_PROVIDER_SITE_OTHER): Payer: PPO | Admitting: Internal Medicine

## 2017-07-11 VITALS — BP 124/82 | HR 68 | Temp 97.9°F | Resp 14 | Ht 66.0 in | Wt 188.4 lb

## 2017-07-11 DIAGNOSIS — R202 Paresthesia of skin: Secondary | ICD-10-CM | POA: Diagnosis not present

## 2017-07-11 DIAGNOSIS — Z853 Personal history of malignant neoplasm of breast: Secondary | ICD-10-CM | POA: Diagnosis not present

## 2017-07-11 DIAGNOSIS — Z23 Encounter for immunization: Secondary | ICD-10-CM

## 2017-07-11 DIAGNOSIS — I10 Essential (primary) hypertension: Secondary | ICD-10-CM | POA: Diagnosis not present

## 2017-07-11 DIAGNOSIS — E785 Hyperlipidemia, unspecified: Secondary | ICD-10-CM

## 2017-07-11 DIAGNOSIS — Z0001 Encounter for general adult medical examination with abnormal findings: Secondary | ICD-10-CM

## 2017-07-11 DIAGNOSIS — Z0000 Encounter for general adult medical examination without abnormal findings: Secondary | ICD-10-CM

## 2017-07-11 LAB — CBC WITH DIFFERENTIAL/PLATELET
BASOS ABS: 0.1 10*3/uL (ref 0.0–0.1)
Basophils Relative: 1.3 % (ref 0.0–3.0)
EOS ABS: 0.3 10*3/uL (ref 0.0–0.7)
Eosinophils Relative: 4.6 % (ref 0.0–5.0)
HEMATOCRIT: 39.3 % (ref 36.0–46.0)
HEMOGLOBIN: 12.7 g/dL (ref 12.0–15.0)
Lymphocytes Relative: 21.9 % (ref 12.0–46.0)
Lymphs Abs: 1.6 10*3/uL (ref 0.7–4.0)
MCHC: 32.4 g/dL (ref 30.0–36.0)
MCV: 87.2 fl (ref 78.0–100.0)
MONOS PCT: 8.8 % (ref 3.0–12.0)
Monocytes Absolute: 0.6 10*3/uL (ref 0.1–1.0)
Neutro Abs: 4.6 10*3/uL (ref 1.4–7.7)
Neutrophils Relative %: 63.4 % (ref 43.0–77.0)
Platelets: 302 10*3/uL (ref 150.0–400.0)
RBC: 4.51 Mil/uL (ref 3.87–5.11)
RDW: 13.7 % (ref 11.5–15.5)
WBC: 7.2 10*3/uL (ref 4.0–10.5)

## 2017-07-11 LAB — COMPREHENSIVE METABOLIC PANEL
ALT: 11 U/L (ref 0–35)
AST: 14 U/L (ref 0–37)
Albumin: 4.2 g/dL (ref 3.5–5.2)
Alkaline Phosphatase: 59 U/L (ref 39–117)
BILIRUBIN TOTAL: 0.6 mg/dL (ref 0.2–1.2)
BUN: 22 mg/dL (ref 6–23)
CO2: 28 meq/L (ref 19–32)
Calcium: 8.9 mg/dL (ref 8.4–10.5)
Chloride: 102 mEq/L (ref 96–112)
Creatinine, Ser: 1.28 mg/dL — ABNORMAL HIGH (ref 0.40–1.20)
GFR: 42.76 mL/min — AB (ref >60.00)
GLUCOSE: 110 mg/dL — AB (ref 70–99)
POTASSIUM: 4.2 meq/L (ref 3.5–5.1)
SODIUM: 137 meq/L (ref 135–145)
Total Protein: 7.4 g/dL (ref 6.0–8.3)

## 2017-07-11 LAB — LIPID PANEL
CHOL/HDL RATIO: 4
Cholesterol: 197 mg/dL (ref 0–200)
HDL: 48.4 mg/dL (ref >39.00)
LDL Cholesterol: 126 mg/dL — ABNORMAL HIGH (ref 0–99)
NonHDL: 148.51
Triglycerides: 112 mg/dL (ref 0.0–149.0)
VLDL: 22.4 mg/dL (ref 0.0–40.0)

## 2017-07-11 MED ORDER — HYDROCHLOROTHIAZIDE 25 MG PO TABS: 25.0000 mg | ORAL_TABLET | Freq: Every day | ORAL | 3 refills | Status: DC

## 2017-07-11 MED ORDER — LISINOPRIL 20 MG PO TABS: 20.0000 mg | ORAL_TABLET | Freq: Every day | ORAL | 3 refills | Status: DC

## 2017-07-11 MED ORDER — VERAPAMIL HCL ER 240 MG PO TBCR: 240.0000 mg | EXTENDED_RELEASE_TABLET | Freq: Every day | ORAL | 3 refills | Status: DC

## 2017-07-11 MED ORDER — OMEPRAZOLE 20 MG PO CPDR: 20.0000 mg | DELAYED_RELEASE_CAPSULE | Freq: Every day | ORAL | 3 refills | Status: DC | PRN

## 2017-07-11 NOTE — Assessment & Plan Note (Addendum)
-  Td 06-2017 ; pneumonia shot--2007 ; prevnar-- 2015;  shingles immunization: strongly declined, afraid of getting shingles from it;  shingrix a non-live virus discussed  Flu shot  Today -No furthert PAPs, see previous entries  -H/o breast ca, see assessment  A/p -CCS: last colonoscopy 06/2009, negative, next 10 years -Labs: CMP, FLP, CBC -diet- exercise discussed  -rec a  Medicare wellness exam if so desired

## 2017-07-11 NOTE — Progress Notes (Signed)
Pre visit review using our clinic review tool, if applicable. No additional management support is needed unless otherwise documented below in the visit note. 

## 2017-07-11 NOTE — Progress Notes (Signed)
Subjective:    Patient ID: Doris Mcdaniel, female    DOB: 18-Jan-1936, 81 y.o.   MRN: [id number]  DOS:  07/11/2017 Type of visit - description :  cpx Interval history: Feeling well, good compliance with medication.   Review of Systems  For the last year has on and off burning feeling on the whole right hand, symptoms are typically at night, wake her up, they last several minutes. She has used a hand  brace and that seemed to prevent the symptoms. No rash, no neck/elbow/wrist pain.  Other than above, a 14 point review of systems is negative     Past Medical History:  Diagnosis Date  . Abnormal x-ray of spine    Mild Compression deformity of the the T8 vertebral body, age indertiminate   . Allergic rhinitis   . GERD (gastroesophageal reflux disease)   . History of breast cancer 2012   left breast  . Hyperlipidemia   . Hypertension   . Personal history of radiation therapy     Past Surgical History:  Procedure Laterality Date  . ABDOMINAL HYSTERECTOMY    . BREAST BIOPSY Left 01/24/2011  . BREAST LUMPECTOMY Left 02/21/2011  . BREAST SURGERY     lumpectomy (L)  . CATARACT EXTRACTION     B  . CHOLECYSTECTOMY  03/2008  . OOPHORECTOMY     they left a part of the ovarly    Social History   Socioeconomic History  . Marital status: Married    Spouse name: Not on file  . Number of children: 2  . Years of education: Not on file  . Highest education level: Not on file  Social Needs  . Financial resource strain: Not on file  . Food insecurity - worry: Not on file  . Food insecurity - inability: Not on file  . Transportation needs - medical: Not on file  . Transportation needs - non-medical: Not on file  Occupational History  . Occupation: retired  Tobacco Use  . Smoking status: Former Smoker    Last attempt to quit: 07/08/1973    Years since quitting: 44.0  . Smokeless tobacco: Never Used  . Tobacco comment: used to smoke 1 PPD  Substance and Sexual Activity    . Alcohol use: No  . Drug use: No  . Sexual activity: Not Currently  Other Topics Concern  . Not on file  Social History Narrative   Lives w/ husband . 2 children live near by     Family History  Problem Relation Age of Onset  . Heart disease Brother        CABG age ~ 70  . Colon cancer Neg Hx   . Breast cancer Neg Hx   . Stroke Neg Hx   . Hypertension Neg Hx   . Diabetes Neg Hx      Allergies as of 07/11/2017   No Known Allergies     Medication List        Accurate as of 07/11/17 11:59 PM. Always use your most recent med list.          aspirin 81 MG tablet Take 1 tablet (81 mg total) by mouth daily.   hydrochlorothiazide 25 MG tablet Commonly known as:  HYDRODIURIL Take 1 tablet (25 mg total) by mouth daily.   lisinopril 20 MG tablet Commonly known as:  PRINIVIL,ZESTRIL Take 1 tablet (20 mg total) by mouth daily.   loratadine 10 MG tablet Commonly known as:  CLARITIN Take 1  tablet (10 mg total) by mouth daily.   multivitamin capsule Take 1 capsule by mouth daily.   omeprazole 20 MG capsule Commonly known as:  PRILOSEC Take 1 capsule (20 mg total) by mouth daily as needed.   verapamil 240 MG CR tablet Commonly known as:  CALAN-SR Take 1 tablet (240 mg total) by mouth at bedtime.          Objective:   Physical Exam BP 124/82 (BP Location: Left Arm, Patient Position: Sitting, Cuff Size: Normal)   Pulse 68   Temp 97.9 F (36.6 C) (Oral)   Resp 14   Ht 5\' 6"  (1.676 m)   Wt 188 lb 6 oz (85.4 kg)   SpO2 98%   BMI 30.40 kg/m  General:   Well developed, well nourished . NAD.  Neck: No  thyromegaly  HEENT:  Normocephalic . Face symmetric, atraumatic Lungs:  CTA B Normal respiratory effort, no intercostal retractions, no accessory muscle use. Breast: no dominant mass, skin and nipples normal to inspection on palpation, axillary areas without mass or lymphadenopathy + Postsurgical changes, left breast. Heart: RRR,  no murmur.  No pretibial  edema bilaterally  Abdomen:  Not distended, soft, non-tender. No rebound or rigidity.   Skin: Exposed areas without rash. Not pale. Not jaundice Upper extremities: Wrists, hands and fingers without synovitis but with changes consistent with DJD.  Good radial pulses. Neurologic:  alert & oriented X3.  Speech normal, gait appropriate for age and unassisted Strength symmetric and appropriate for age.  Psych: Cognition and judgment appear intact.  Cooperative with normal attention span and concentration.  Behavior appropriate. No anxious or depressed appearing.     Assessment & Plan:   Assessment  HTN Hyperlipidemia GERD DJD  Breast cancer, 2012, left, lumpectomy, XRT. Does not see oncology regularly  MSK: --T8  mild compression per x-ray, declined Rx  --DEXA 2014 and  2018: Normal. Vitamin D normal when checked  PLAN  HTN: Seems control, continue calan, lisinopril and hydrochlorothiazide. Labs. Hyperlipidemia: Diet controlled, labs Hand paresthesias: Atypical CTS?  Sxs seems to be better with the use of a brace.  Rec consistent use of her brace and call if not improving. H/o  breast cancer: Negative mammogram 11-2016.  Breast exam done today: wnl.  Strongly recommend self breast exam RTC 1 year

## 2017-07-11 NOTE — Patient Instructions (Addendum)
GO TO THE LAB : Get the blood work     GO TO THE FRONT DESK Schedule your next appointment for a sickle exam in 1 year   Check the  blood pressure 2 or 3 times a month week monthly weekly daily Be sure your blood pressure is between 110/65 and  135/85. If it is consistently higher or lower, let me know  Please consider Medicare wellness with on  of our nurses

## 2017-07-12 NOTE — Assessment & Plan Note (Signed)
HTN: Seems control, continue calan, lisinopril and hydrochlorothiazide. Labs. Hyperlipidemia: Diet controlled, labs Hand paresthesias: Atypical CTS?  Sxs seems to be better with the use of a brace.  Rec consistent use of her brace and call if not improving. H/o  breast cancer: Negative mammogram 11-2016.  Breast exam done today: wnl.  Strongly recommend self breast exam RTC 1 year

## 2017-07-15 NOTE — Addendum Note (Signed)
Addended byDamita Dunnings D on: 07/15/2017 09:35 AM   Modules accepted: Orders

## 2017-11-18 ENCOUNTER — Telehealth: Payer: Self-pay | Admitting: Internal Medicine

## 2017-11-18 NOTE — Telephone Encounter (Signed)
See last lab results, due for a BMP, DX HTN, please arrange

## 2017-11-18 NOTE — Telephone Encounter (Signed)
Letter printed and mailed to Pt- instructed her to call office to schedule lab appt at her earliest convenience. BMP ordered.

## 2017-11-27 ENCOUNTER — Other Ambulatory Visit (INDEPENDENT_AMBULATORY_CARE_PROVIDER_SITE_OTHER): Payer: PPO

## 2017-11-27 DIAGNOSIS — I10 Essential (primary) hypertension: Secondary | ICD-10-CM | POA: Diagnosis not present

## 2017-11-27 LAB — BASIC METABOLIC PANEL
BUN: 22 mg/dL (ref 6–23)
CALCIUM: 8.9 mg/dL (ref 8.4–10.5)
CO2: 28 mEq/L (ref 19–32)
CREATININE: 1.23 mg/dL — AB (ref 0.40–1.20)
Chloride: 102 mEq/L (ref 96–112)
GFR: 44.73 mL/min — ABNORMAL LOW (ref >60.00)
Glucose, Bld: 102 mg/dL — ABNORMAL HIGH (ref 70–99)
Potassium: 4 mEq/L (ref 3.5–5.1)
Sodium: 138 mEq/L (ref 135–145)

## 2018-01-20 ENCOUNTER — Ambulatory Visit: Payer: Self-pay | Admitting: *Deleted

## 2018-02-12 NOTE — Progress Notes (Addendum)
Subjective:   Doris Mcdaniel is a 82 y.o. female who presents for Medicare Annual (Subsequent) preventive examination.  Review of Systems: No ROS.  Medicare Wellness Visit. Additional risk factors are reflected in the social history. Cardiac Risk Factors include: advanced age (>10men, >88 women);dyslipidemia;hypertension Sleep patterns: no issues.  Home Safety/Smoke Alarms: Feels safe in home. Smoke alarms in place.  Living environment; residence and Firearm Safety: Lives with husband in 1 story home.   Female:        Mammo- pt states she will think about it.      Dexa scan-utd        CCS-07/05/09 Eyes- Dr.McCuen yearly.    Objective:     Vitals: BP 136/68 (BP Location: Left Arm, Patient Position: Sitting, Cuff Size: Normal)   Pulse 70   Ht 5\' 6"  (1.676 m)   Wt 186 lb 3.2 oz (84.5 kg)   SpO2 97%   BMI 30.05 kg/m   Body mass index is 30.05 kg/m.  Advanced Directives 02/17/2018 01/17/2017  Does Patient Have a Medical Advance Directive? Yes Yes  Type of Paramedic of [place];Living will [place];Living will  Does patient want to make changes to medical advance directive? No - Patient declined -  Copy of [place] in Chart? No - copy requested No - copy requested    Tobacco Social History   Tobacco Use  Smoking Status Former Smoker  . Last attempt to quit: 07/08/1973  . Years since quitting: 44.6  Smokeless Tobacco Never Used  Tobacco Comment   used to smoke 1 PPD     Counseling given: Not Answered Comment: used to smoke 1 PPD   Clinical Intake: Pain : No/denies pain     Past Medical History:  Diagnosis Date  . Abnormal x-ray of spine    Mild Compression deformity of the the T8 vertebral body, age indertiminate   . Allergic rhinitis   . GERD (gastroesophageal reflux disease)   . History of breast cancer 2012   left breast  . Hyperlipidemia   . Hypertension   . Personal history of radiation  therapy    Past Surgical History:  Procedure Laterality Date  . ABDOMINAL HYSTERECTOMY    . BREAST BIOPSY Left 01/24/2011  . BREAST LUMPECTOMY Left 02/21/2011  . BREAST SURGERY     lumpectomy (L)  . CATARACT EXTRACTION     B  . CHOLECYSTECTOMY  03/2008  . OOPHORECTOMY     they left a part of the ovarly   Family History  Problem Relation Age of Onset  . Heart disease Brother        CABG age ~ 69  . Colon cancer Neg Hx   . Breast cancer Neg Hx   . Stroke Neg Hx   . Hypertension Neg Hx   . Diabetes Neg Hx    Social History   Socioeconomic History  . Marital status: Married    Spouse name: Not on file  . Number of children: 2  . Years of education: Not on file  . Highest education level: Not on file  Occupational History  . Occupation: retired  Scientific laboratory technician  . Financial resource strain: Not on file  . Food insecurity:    Worry: Not on file    Inability: Not on file  . Transportation needs:    Medical: Not on file    Non-medical: Not on file  Tobacco Use  . Smoking status: Former Smoker  Last attempt to quit: 07/08/1973    Years since quitting: 44.6  . Smokeless tobacco: Never Used  . Tobacco comment: used to smoke 1 PPD  Substance and Sexual Activity  . Alcohol use: No  . Drug use: No  . Sexual activity: Not Currently  Lifestyle  . Physical activity:    Days per week: Not on file    Minutes per session: Not on file  . Stress: Not on file  Relationships  . Social connections:    Talks on phone: Not on file    Gets together: Not on file    Attends religious service: Not on file    Active member of club or organization: Not on file    Attends meetings of clubs or organizations: Not on file    Relationship status: Not on file  Other Topics Concern  . Not on file  Social History Narrative   Lives w/ husband . 2 children live near by    Outpatient Encounter Medications as of 02/17/2018  Medication Sig  . aspirin 81 MG tablet Take 1 tablet (81 mg total)  by mouth daily.  . hydrochlorothiazide (HYDRODIURIL) 25 MG tablet Take 1 tablet (25 mg total) by mouth daily.  Marland Kitchen lisinopril (PRINIVIL,ZESTRIL) 20 MG tablet Take 1 tablet (20 mg total) by mouth daily.  Marland Kitchen loratadine (CLARITIN) 10 MG tablet Take 1 tablet (10 mg total) by mouth daily.  . Multiple Vitamin (MULTIVITAMIN) capsule Take 1 capsule by mouth daily.    Marland Kitchen omeprazole (PRILOSEC) 20 MG capsule Take 1 capsule (20 mg total) by mouth daily as needed.  . verapamil (CALAN-SR) 240 MG CR tablet Take 1 tablet (240 mg total) by mouth at bedtime.   No facility-administered encounter medications on file as of 02/17/2018.     Activities of Daily Living In your present state of health, do you have any difficulty performing the following activities: 02/17/2018  Hearing? N  Vision? N  Difficulty concentrating or making decisions? N  Walking or climbing stairs? N  Dressing or bathing? N  Doing errands, shopping? N  Preparing Food and eating ? N  Using the Toilet? N  In the past six months, have you accidently leaked urine? N  Do you have problems with loss of bowel control? N  Managing your Medications? N  Managing your Finances? N  Housekeeping or managing your Housekeeping? N  Some recent data might be hidden    Patient Care Team: Colon Branch, MD as PCP - General Luberta Mutter, MD as Consulting Physician (Ophthalmology)    Assessment:   This is a routine wellness examination for Doris Mcdaniel. Physical assessment deferred to PCP.   Exercise Activities and Dietary recommendations Current Exercise Habits: The patient does not participate in regular exercise at present, Exercise limited by: None identified Diet (meal preparation, eat out, water intake, caffeinated beverages, dairy products, fruits and vegetables): well balanced. Pt reports she needs to drink more water.    Goals    . maintain healthy active lifestyle.       Fall Risk Fall Risk  02/17/2018 01/17/2017 07/09/2016 07/05/2015  07/02/2014  Falls in the past year? No No No No No    Depression Screen PHQ 2/9 Scores 02/17/2018 01/17/2017 07/09/2016 07/05/2015  PHQ - 2 Score 1 1 0 0  PHQ- 9 Score - - - -     Cognitive Function MMSE - Mini Mental State Exam 02/17/2018 01/17/2017  Orientation to time 5 5  Orientation to Place 5 5  Registration 3 3  Attention/ Calculation 5 5  Recall 2 2  Language- name 2 objects 2 2  Language- repeat 1 1  Language- follow 3 step command 3 3  Language- read & follow direction 1 1  Write a sentence 1 1  Copy design 1 0  Total score 29 28        Immunization History  Administered Date(s) Administered  . Influenza Split 07/09/2011, 06/24/2012  . Influenza Whole 05/05/2008, 05/06/2009, 07/12/2010  . Influenza, High Dose Seasonal PF 06/30/2013, 07/05/2015, 07/09/2016, 07/11/2017  . Influenza,inj,Quad PF,6+ Mos 07/02/2014  . Pneumococcal Conjugate-13 07/02/2014  . Pneumococcal Polysaccharide-23 07/13/2006  . Td 05/21/2007, 07/11/2017    Screening Tests Health Maintenance  Topic Date Due  . MAMMOGRAM  12/10/2017  . INFLUENZA VACCINE  03/13/2018  . TETANUS/TDAP  07/12/2027  . DEXA SCAN  Completed  . PNA vac Low Risk Adult  Completed      Plan:    Please schedule your next medicare wellness visit with me in 1 yr.  Continue to eat heart healthy diet (full of fruits, vegetables, whole grains, lean protein, water--limit salt, fat, and sugar intake) and increase physical activity as tolerated.  Continue doing brain stimulating activities (puzzles, reading, adult coloring books, staying active) to keep memory sharp.   Bring a copy of your living will and/or healthcare power of attorney to your next office visit.  Consider scheduling your mammogram as discussed.  I have personally reviewed and noted the following in the patient's chart:   . Medical and social history . Use of alcohol, tobacco or illicit drugs  . Current medications and supplements . Functional ability and  status . Nutritional status . Physical activity . Advanced directives . List of other physicians . Hospitalizations, surgeries, and ER visits in previous 12 months . Vitals . Screenings to include cognitive, depression, and falls . Referrals and appointments  In addition, I have reviewed and discussed with patient certain preventive protocols, quality metrics, and best practice recommendations. A written personalized care plan for preventive services as well as general preventive health recommendations were provided to patient.     Naaman Plummer [place], [place]  02/17/2018   Kathlene November, MD

## 2018-02-17 ENCOUNTER — Ambulatory Visit (INDEPENDENT_AMBULATORY_CARE_PROVIDER_SITE_OTHER): Payer: PPO | Admitting: *Deleted

## 2018-02-17 ENCOUNTER — Encounter: Payer: Self-pay | Admitting: *Deleted

## 2018-02-17 VITALS — BP 136/68 | HR 70 | Ht 66.0 in | Wt 186.2 lb

## 2018-02-17 DIAGNOSIS — Z0000 Encounter for general adult medical examination without abnormal findings: Secondary | ICD-10-CM

## 2018-02-17 NOTE — Patient Instructions (Signed)
Please schedule your next medicare wellness visit with me in 1 yr.  Continue to eat heart healthy diet (full of fruits, vegetables, whole grains, lean protein, water--limit salt, fat, and sugar intake) and increase physical activity as tolerated.  Continue doing brain stimulating activities (puzzles, reading, adult coloring books, staying active) to keep memory sharp.   Bring a copy of your living will and/or healthcare power of attorney to your next office visit.  Consider scheduling your mammogram as discussed.   Doris Mcdaniel , Thank you for taking time to come for your Medicare Wellness Visit. I appreciate your ongoing commitment to your health goals. Please review the following plan we discussed and let me know if I can assist you in the future.   These are the goals we discussed: Goals    . maintain healthy active lifestyle.       This is a list of the screening recommended for you and due dates:  Health Maintenance  Topic Date Due  . Mammogram  12/10/2017  . Flu Shot  03/13/2018  . Tetanus Vaccine  07/12/2027  . DEXA scan (bone density measurement)  Completed  . Pneumonia vaccines  Completed    Health Maintenance for Postmenopausal Women Menopause is a normal process in which your reproductive ability comes to an end. This process happens gradually over a span of months to years, usually between the ages of 85 and 59. Menopause is complete when you have missed 12 consecutive menstrual periods. It is important to talk with your health care provider about some of the most common conditions that affect postmenopausal women, such as heart disease, cancer, and bone loss (osteoporosis). Adopting a healthy lifestyle and getting preventive care can help to promote your health and wellness. Those actions can also lower your chances of developing some of these common conditions. What should I know about menopause? During menopause, you may experience a number of symptoms, such  as:  Moderate-to-severe hot flashes.  Night sweats.  Decrease in sex drive.  Mood swings.  Headaches.  Tiredness.  Irritability.  Memory problems.  Insomnia.  Choosing to treat or not to treat menopausal changes is an individual decision that you make with your health care provider. What should I know about hormone replacement therapy and supplements? Hormone therapy products are effective for treating symptoms that are associated with menopause, such as hot flashes and night sweats. Hormone replacement carries certain risks, especially as you become older. If you are thinking about using estrogen or estrogen with progestin treatments, discuss the benefits and risks with your health care provider. What should I know about heart disease and stroke? Heart disease, heart attack, and stroke become more likely as you age. This may be due, in part, to the hormonal changes that your body experiences during menopause. These can affect how your body processes dietary fats, triglycerides, and cholesterol. Heart attack and stroke are both medical emergencies. There are many things that you can do to help prevent heart disease and stroke:  Have your blood pressure checked at least every 1-2 years. High blood pressure causes heart disease and increases the risk of stroke.  If you are 3-70 years old, ask your health care provider if you should take aspirin to prevent a heart attack or a stroke.  Do not use any tobacco products, including cigarettes, chewing tobacco, or electronic cigarettes. If you need help quitting, ask your health care provider.  It is important to eat a healthy diet and maintain a healthy weight. ?  Be sure to include plenty of vegetables, fruits, low-fat dairy products, and lean protein. ? Avoid eating foods that are high in solid fats, added sugars, or salt (sodium).  Get regular exercise. This is one of the most important things that you can do for your health. ? Try  to exercise for at least 150 minutes each week. The type of exercise that you do should increase your heart rate and make you sweat. This is known as moderate-intensity exercise. ? Try to do strengthening exercises at least twice each week. Do these in addition to the moderate-intensity exercise.  Know your numbers.Ask your health care provider to check your cholesterol and your blood glucose. Continue to have your blood tested as directed by your health care provider.  What should I know about cancer screening? There are several types of cancer. Take the following steps to reduce your risk and to catch any cancer development as early as possible. Breast Cancer  Practice breast self-awareness. ? This means understanding how your breasts normally appear and feel. ? It also means doing regular breast self-exams. Let your health care provider know about any changes, no matter how small.  If you are 35 or older, have a clinician do a breast exam (clinical breast exam or CBE) every year. Depending on your age, family history, and medical history, it may be recommended that you also have a yearly breast X-ray (mammogram).  If you have a family history of breast cancer, talk with your health care provider about genetic screening.  If you are at high risk for breast cancer, talk with your health care provider about having an MRI and a mammogram every year.  Breast cancer (BRCA) gene test is recommended for women who have family members with BRCA-related cancers. Results of the assessment will determine the need for genetic counseling and BRCA1 and for BRCA2 testing. BRCA-related cancers include these types: ? Breast. This occurs in males or females. ? Ovarian. ? Tubal. This may also be called fallopian tube cancer. ? Cancer of the abdominal or pelvic lining (peritoneal cancer). ? Prostate. ? Pancreatic.  Cervical, Uterine, and Ovarian Cancer Your health care provider may recommend that you be  screened regularly for cancer of the pelvic organs. These include your ovaries, uterus, and vagina. This screening involves a pelvic exam, which includes checking for microscopic changes to the surface of your cervix (Pap test).  For women ages 21-65, health care providers may recommend a pelvic exam and a Pap test every three years. For women ages 14-65, they may recommend the Pap test and pelvic exam, combined with testing for human papilloma virus (HPV), every five years. Some types of HPV increase your risk of cervical cancer. Testing for HPV may also be done on women of any age who have unclear Pap test results.  Other health care providers may not recommend any screening for nonpregnant women who are considered low risk for pelvic cancer and have no symptoms. Ask your health care provider if a screening pelvic exam is right for you.  If you have had past treatment for cervical cancer or a condition that could lead to cancer, you need Pap tests and screening for cancer for at least 20 years after your treatment. If Pap tests have been discontinued for you, your risk factors (such as having a new sexual partner) need to be reassessed to determine if you should start having screenings again. Some women have medical problems that increase the chance of getting cervical cancer.  In these cases, your health care provider may recommend that you have screening and Pap tests more often.  If you have a family history of uterine cancer or ovarian cancer, talk with your health care provider about genetic screening.  If you have vaginal bleeding after reaching menopause, tell your health care provider.  There are currently no reliable tests available to screen for ovarian cancer.  Lung Cancer Lung cancer screening is recommended for adults 63-77 years old who are at high risk for lung cancer because of a history of smoking. A yearly low-dose CT scan of the lungs is recommended if you:  Currently  smoke.  Have a history of at least 30 pack-years of smoking and you currently smoke or have quit within the past 15 years. A pack-year is smoking an average of one pack of cigarettes per day for one year.  Yearly screening should:  Continue until it has been 15 years since you quit.  Stop if you develop a health problem that would prevent you from having lung cancer treatment.  Colorectal Cancer  This type of cancer can be detected and can often be prevented.  Routine colorectal cancer screening usually begins at age 46 and continues through age 1.  If you have risk factors for colon cancer, your health care provider may recommend that you be screened at an earlier age.  If you have a family history of colorectal cancer, talk with your health care provider about genetic screening.  Your health care provider may also recommend using home test kits to check for hidden blood in your stool.  A small camera at the end of a tube can be used to examine your colon directly (sigmoidoscopy or colonoscopy). This is done to check for the earliest forms of colorectal cancer.  Direct examination of the colon should be repeated every 5-10 years until age 13. However, if early forms of precancerous polyps or small growths are found or if you have a family history or genetic risk for colorectal cancer, you may need to be screened more often.  Skin Cancer  Check your skin from head to toe regularly.  Monitor any moles. Be sure to tell your health care provider: ? About any new moles or changes in moles, especially if there is a change in a mole's shape or color. ? If you have a mole that is larger than the size of a pencil eraser.  If any of your family members has a history of skin cancer, especially at a young age, talk with your health care provider about genetic screening.  Always use sunscreen. Apply sunscreen liberally and repeatedly throughout the day.  Whenever you are outside, protect  yourself by wearing long sleeves, pants, a wide-brimmed hat, and sunglasses.  What should I know about osteoporosis? Osteoporosis is a condition in which bone destruction happens more quickly than new bone creation. After menopause, you may be at an increased risk for osteoporosis. To help prevent osteoporosis or the bone fractures that can happen because of osteoporosis, the following is recommended:  If you are 23-21 years old, get at least 1,000 mg of calcium and at least 600 mg of vitamin D per day.  If you are older than age 73 but younger than age 71, get at least 1,200 mg of calcium and at least 600 mg of vitamin D per day.  If you are older than age 62, get at least 1,200 mg of calcium and at least 800 mg of vitamin  D per day.  Smoking and excessive alcohol intake increase the risk of osteoporosis. Eat foods that are rich in calcium and vitamin D, and do weight-bearing exercises several times each week as directed by your health care provider. What should I know about how menopause affects my mental health? Depression may occur at any age, but it is more common as you become older. Common symptoms of depression include:  Low or sad mood.  Changes in sleep patterns.  Changes in appetite or eating patterns.  Feeling an overall lack of motivation or enjoyment of activities that you previously enjoyed.  Frequent crying spells.  Talk with your health care provider if you think that you are experiencing depression. What should I know about immunizations? It is important that you get and maintain your immunizations. These include:  Tetanus, diphtheria, and pertussis (Tdap) booster vaccine.  Influenza every year before the flu season begins.  Pneumonia vaccine.  Shingles vaccine.  Your health care provider may also recommend other immunizations. This information is not intended to replace advice given to you by your health care provider. Make sure you discuss any questions you  have with your health care provider. Document Released: 09/21/2005 Document Revised: 02/17/2016 Document Reviewed: 05/03/2015 Elsevier Interactive Patient Education  2018 [employer].

## 2018-06-12 DIAGNOSIS — H52203 Unspecified astigmatism, bilateral: Secondary | ICD-10-CM | POA: Diagnosis not present

## 2018-06-12 DIAGNOSIS — H353131 Nonexudative age-related macular degeneration, bilateral, early dry stage: Secondary | ICD-10-CM | POA: Diagnosis not present

## 2018-06-12 DIAGNOSIS — H26493 Other secondary cataract, bilateral: Secondary | ICD-10-CM | POA: Diagnosis not present

## 2018-07-14 ENCOUNTER — Ambulatory Visit (INDEPENDENT_AMBULATORY_CARE_PROVIDER_SITE_OTHER): Payer: PPO | Admitting: Internal Medicine

## 2018-07-14 ENCOUNTER — Encounter: Payer: Self-pay | Admitting: Internal Medicine

## 2018-07-14 VITALS — BP 126/68 | HR 69 | Temp 97.9°F | Resp 16 | Ht 66.0 in | Wt 188.1 lb

## 2018-07-14 DIAGNOSIS — R739 Hyperglycemia, unspecified: Secondary | ICD-10-CM

## 2018-07-14 DIAGNOSIS — Z23 Encounter for immunization: Secondary | ICD-10-CM | POA: Diagnosis not present

## 2018-07-14 DIAGNOSIS — R202 Paresthesia of skin: Secondary | ICD-10-CM

## 2018-07-14 DIAGNOSIS — Z853 Personal history of malignant neoplasm of breast: Secondary | ICD-10-CM | POA: Diagnosis not present

## 2018-07-14 DIAGNOSIS — Z0000 Encounter for general adult medical examination without abnormal findings: Secondary | ICD-10-CM | POA: Diagnosis not present

## 2018-07-14 LAB — CBC WITH DIFFERENTIAL/PLATELET
Basophils Absolute: 0.1 10*3/uL (ref 0.0–0.1)
Basophils Relative: 1.2 % (ref 0.0–3.0)
Eosinophils Absolute: 0.3 10*3/uL (ref 0.0–0.7)
Eosinophils Relative: 3.6 % (ref 0.0–5.0)
HCT: 38.2 % (ref 36.0–46.0)
Hemoglobin: 12.6 g/dL (ref 12.0–15.0)
Lymphocytes Relative: 19.9 % (ref 12.0–46.0)
Lymphs Abs: 1.5 10*3/uL (ref 0.7–4.0)
MCHC: 33.1 g/dL (ref 30.0–36.0)
MCV: 85.9 fl (ref 78.0–100.0)
Monocytes Absolute: 0.6 10*3/uL (ref 0.1–1.0)
Monocytes Relative: 8.4 % (ref 3.0–12.0)
Neutro Abs: 5 10*3/uL (ref 1.4–7.7)
Neutrophils Relative %: 66.9 % (ref 43.0–77.0)
Platelets: 291 10*3/uL (ref 150.0–400.0)
RBC: 4.44 Mil/uL (ref 3.87–5.11)
RDW: 14 % (ref 11.5–15.5)
WBC: 7.4 10*3/uL (ref 4.0–10.5)

## 2018-07-14 LAB — COMPREHENSIVE METABOLIC PANEL
ALT: 12 U/L (ref 0–35)
AST: 13 U/L (ref 0–37)
Albumin: 4.3 g/dL (ref 3.5–5.2)
Alkaline Phosphatase: 59 U/L (ref 39–117)
BUN: 20 mg/dL (ref 6–23)
CO2: 27 meq/L (ref 19–32)
Calcium: 9.1 mg/dL (ref 8.4–10.5)
Chloride: 101 mEq/L (ref 96–112)
Creatinine, Ser: 1.27 mg/dL — ABNORMAL HIGH (ref 0.40–1.20)
GFR: 43.04 mL/min — ABNORMAL LOW (ref >60.00)
GLUCOSE: 106 mg/dL — AB (ref 70–99)
Potassium: 3.8 mEq/L (ref 3.5–5.1)
Sodium: 139 mEq/L (ref 135–145)
Total Bilirubin: 0.6 mg/dL (ref 0.2–1.2)
Total Protein: 6.9 g/dL (ref 6.0–8.3)

## 2018-07-14 LAB — LIPID PANEL
Cholesterol: 201 mg/dL — ABNORMAL HIGH (ref 0–200)
HDL: 45.9 mg/dL (ref >39.00)
LDL Cholesterol: 122 mg/dL — ABNORMAL HIGH (ref 0–99)
NonHDL: 155.4
Total CHOL/HDL Ratio: 4
Triglycerides: 168 mg/dL — ABNORMAL HIGH (ref 0.0–149.0)
VLDL: 33.6 mg/dL (ref 0.0–40.0)

## 2018-07-14 LAB — HEMOGLOBIN A1C: Hgb A1c MFr Bld: 5.6 % (ref 4.6–6.5)

## 2018-07-14 LAB — TSH: TSH: 1.4 u[IU]/mL (ref 0.35–4.50)

## 2018-07-14 NOTE — Assessment & Plan Note (Addendum)
-  Td 06-2017 ; pneumonia shot: 2007 and 07/2018 ; prevnar: 2015;   Flu shot today; strongly declined shingrix  -No furthert PAPs, see previous entries  -H/o breast ca, see assessment  A/p -CCS: last colonoscopy 06/2009, negative, next 10 years -Labs: CMP, FLP, CBC, TSH, A1c -Diet and exercise discussed -Based on current literature, okay to stop aspirin

## 2018-07-14 NOTE — Progress Notes (Signed)
Subjective:    Patient ID: Doris Mcdaniel, female    DOB: [birth year], 82 y.o.   MRN: [id number]  DOS:  07/14/2018 Type of visit - description : cpx In general feels very well.   Review of Systems Mobility has decreased, request handicap parking permit which I think is reasonable. Since last year, she continue with problems with her right hand, often times at night the whole hand feels in fire . Denies neck pain per se or elbow pain.  No swelling.  Other than above, a 14 point review of systems is negative     Past Medical History:  Diagnosis Date  . Abnormal x-ray of spine    Mild Compression deformity of the the T8 vertebral body, age indertiminate   . Allergic rhinitis   . GERD (gastroesophageal reflux disease)   . History of breast cancer 2012   left breast  . Hyperlipidemia   . Hypertension   . Personal history of radiation therapy     Past Surgical History:  Procedure Laterality Date  . ABDOMINAL HYSTERECTOMY    . BREAST BIOPSY Left 01/24/2011  . BREAST LUMPECTOMY Left 02/21/2011  . BREAST SURGERY     lumpectomy (L)  . CATARACT EXTRACTION     B  . CHOLECYSTECTOMY  03/2008  . OOPHORECTOMY     they left a part of the ovarly    Social History   Socioeconomic History  . Marital status: Married    Spouse name: Not on file  . Number of children: 2  . Years of education: Not on file  . Highest education level: Not on file  Occupational History  . Occupation: retired  Scientific laboratory technician  . Financial resource strain: Not on file  . Food insecurity:    Worry: Not on file    Inability: Not on file  . Transportation needs:    Medical: Not on file    Non-medical: Not on file  Tobacco Use  . Smoking status: Former Smoker    Last attempt to quit: 07/08/1973    Years since quitting: 45.0  . Smokeless tobacco: Never Used  . Tobacco comment: used to smoke 1 PPD  Substance and Sexual Activity  . Alcohol use: No  . Drug use: No  . Sexual activity: Not Currently    Lifestyle  . Physical activity:    Days per week: Not on file    Minutes per session: Not on file  . Stress: Not on file  Relationships  . Social connections:    Talks on phone: Not on file    Gets together: Not on file    Attends religious service: Not on file    Active member of club or organization: Not on file    Attends meetings of clubs or organizations: Not on file    Relationship status: Not on file  . Intimate partner violence:    Fear of current or ex partner: Not on file    Emotionally abused: Not on file    Physically abused: Not on file    Forced sexual activity: Not on file  Other Topics Concern  . Not on file  Social History Narrative   Lives w/ husband . 2 children live near by     Family History  Problem Relation Age of Onset  . Heart disease Brother        CABG age ~ 67  . Colon cancer Neg Hx   . Breast cancer Neg Hx   .  Stroke Neg Hx   . Hypertension Neg Hx   . Diabetes Neg Hx      Allergies as of 07/14/2018   No Known Allergies     Medication List        Accurate as of 07/14/18  9:35 PM. Always use your most recent med list.          hydrochlorothiazide 25 MG tablet Commonly known as:  HYDRODIURIL Take 1 tablet (25 mg total) by mouth daily.   lisinopril 20 MG tablet Commonly known as:  PRINIVIL,ZESTRIL Take 1 tablet (20 mg total) by mouth daily.   loratadine 10 MG tablet Commonly known as:  CLARITIN Take 1 tablet (10 mg total) by mouth daily.   multivitamin capsule Take 1 capsule by mouth daily.   omeprazole 20 MG capsule Commonly known as:  PRILOSEC Take 1 capsule (20 mg total) by mouth daily as needed.   verapamil 240 MG CR tablet Commonly known as:  CALAN-SR Take 1 tablet (240 mg total) by mouth at bedtime.           Objective:   Physical Exam BP 126/68 (BP Location: Left Arm, Patient Position: Sitting, Cuff Size: Normal)   Pulse 69   Temp 97.9 F (36.6 C) (Oral)   Resp 16   Ht 5\' 6"  (1.676 m)   Wt 188 lb 2 oz  (85.3 kg)   SpO2 97%   BMI 30.36 kg/m  General: Well developed, NAD, BMI noted Neck: No  thyromegaly  HEENT:  Normocephalic . Face symmetric, atraumatic Breast exam: No dominant mass, nipples normal, axillary areas with no lymphadenopathies Lungs:  CTA B Normal respiratory effort, no intercostal retractions, no accessory muscle use. Heart: RRR,  no murmur.  No pretibial edema bilaterally  Abdomen:  Not distended, soft, non-tender. No rebound or rigidity.   Skin: Exposed areas without rash. Not pale. Not jaundice Neurologic:  alert & oriented X3.  Speech normal, gait  Unassisted, slt less mobile than last year Strength symmetric and appropriate for age.  Psych: Cognition and judgment appear intact.  Cooperative with normal attention span and concentration.  Behavior appropriate. No anxious or depressed appearing.     Assessment & Plan:   Assessment  HTN Hyperlipidemia GERD DJD  Breast cancer, 2012, left, lumpectomy, XRT. Does not see oncology regularly  MSK: --T8  mild compression per x-ray, declined Rx  --DEXA 2014 and  2018: Normal. Vitamin D normal when checked  PLAN  HTN, hyperlipidemia, GERD, DJD.  Continue present care, checking labs.  Hyperlipidemia is diet controlled. History of breast cancer: Last mammogram 11-2016, patient plans to get a mammogram in January, encouraged to do that.  Clinical breast exam today is benign. Hand paresthesias: Worse than last year, refer to Ortho hand. CTS? Decreased mobility: Gait is not the same as before, slightly weaker,  I agreed to provide handicap parking sticker. RTC 1 year

## 2018-07-14 NOTE — Assessment & Plan Note (Signed)
HTN, hyperlipidemia, GERD, DJD.  Continue present care, checking labs.  Hyperlipidemia is diet controlled. History of breast cancer: Last mammogram 11-2016, patient plans to get a mammogram in January, encouraged to do that.  Clinical breast exam today is benign. Hand paresthesias: Worse than last year, refer to Ortho hand. CTS? Decreased mobility: Gait is not the same as before, slightly weaker,  I agreed to provide handicap parking sticker. RTC 1 year

## 2018-07-14 NOTE — Progress Notes (Signed)
Pre visit review using our clinic review tool, if applicable. No additional management support is needed unless otherwise documented below in the visit note. 

## 2018-07-14 NOTE — Patient Instructions (Signed)
GO TO THE LAB : Get the blood work     GO TO THE FRONT DESK Schedule your next appointment for a physical exam in 1 year  Please proceed with annual mammogram in January

## 2018-07-22 DIAGNOSIS — G5601 Carpal tunnel syndrome, right upper limb: Secondary | ICD-10-CM | POA: Diagnosis not present

## 2018-07-22 DIAGNOSIS — M79641 Pain in right hand: Secondary | ICD-10-CM | POA: Diagnosis not present

## 2018-07-30 ENCOUNTER — Other Ambulatory Visit: Payer: Self-pay | Admitting: Internal Medicine

## 2018-08-21 DIAGNOSIS — G5601 Carpal tunnel syndrome, right upper limb: Secondary | ICD-10-CM | POA: Diagnosis not present

## 2019-02-03 DIAGNOSIS — G5601 Carpal tunnel syndrome, right upper limb: Secondary | ICD-10-CM | POA: Diagnosis not present

## 2019-02-19 ENCOUNTER — Ambulatory Visit: Payer: Self-pay | Admitting: *Deleted

## 2019-03-02 NOTE — Progress Notes (Addendum)
Virtual Visit via Video Note  I connected with patient on 03/03/19 at 11:00 AM EDT by audio enabled telemedicine application and verified that I am speaking with the correct person using two identifiers.   THIS ENCOUNTER IS A VIRTUAL VISIT DUE TO COVID-19 - PATIENT WAS NOT SEEN IN THE OFFICE. PATIENT HAS CONSENTED TO VIRTUAL VISIT / TELEMEDICINE VISIT   Location of patient: home  Location of provider: office  I discussed the limitations of evaluation and management by telemedicine and the availability of in person appointments. The patient expressed understanding and agreed to proceed.   Subjective:   Doris Mcdaniel is a 83 y.o. female who presents for Medicare Annual (Subsequent) preventive examination.  Review of Systems: No ROS.  Medicare Wellness Virtual Visit.  Visual/audio telehealth visit, UTA vital signs.   See social history for additional risk factors. Cardiac Risk Factors include: advanced age (>62men, >89 women);dyslipidemia;hypertension;obesity (BMI >30kg/m2) Sleep patterns: no issues Home Safety/Smoke Alarms: Feels safe in home. Smoke alarms in place.  Lives in 1 story home with husband. Walk-in shower with stool.   Female:   Mammo- pt states she has not done d/t covid. States she will schedule once she feels comfortable.       Dexa scan- 01/23/17. Pt states she will schedule when she feels comfortable.      CCS - 07/05/09     Objective:     Vitals: BP 128/60 Comment: pt reported vitals  Pulse 67    Advanced Directives 03/03/2019 02/17/2018 01/17/2017  Does Patient Have a Medical Advance Directive? Yes Yes Yes  Type of Paramedic of Doris Mcdaniel;Living will Doris Mcdaniel;Living will Doris Mcdaniel;Living will  Does patient want to make changes to medical advance directive? No - Patient declined No - Patient declined -  Copy of Doris Mcdaniel in Chart? Yes - validated most recent copy scanned in chart  (See row information) No - copy requested No - copy requested    Tobacco Social History   Tobacco Use  Smoking Status Former Smoker  . Quit date: 07/08/1973  . Years since quitting: 45.6  Smokeless Tobacco Never Used  Tobacco Comment   used to smoke 1 PPD     Counseling given: Not Answered Comment: used to smoke 1 PPD   Clinical Intake: Pain : No/denies pain    Past Medical History:  Diagnosis Date  . Abnormal x-ray of spine    Mild Compression deformity of the the T8 vertebral body, age indertiminate   . Allergic rhinitis   . GERD (gastroesophageal reflux disease)   . History of breast cancer 2012   left breast  . Hyperlipidemia   . Hypertension   . Personal history of radiation therapy    Past Surgical History:  Procedure Laterality Date  . ABDOMINAL HYSTERECTOMY    . BREAST BIOPSY Left 01/24/2011  . BREAST LUMPECTOMY Left 02/21/2011  . BREAST SURGERY     lumpectomy (L)  . CATARACT EXTRACTION     B  . CHOLECYSTECTOMY  03/2008  . OOPHORECTOMY     they left a part of the ovarly   Family History  Problem Relation Age of Onset  . Heart disease Brother        CABG age ~ 82  . Colon cancer Neg Hx   . Breast cancer Neg Hx   . Stroke Neg Hx   . Hypertension Neg Hx   . Diabetes Neg Hx    Social History  Socioeconomic History  . Marital status: Married    Spouse name: Not on file  . Number of children: 2  . Years of education: Not on file  . Highest education level: Not on file  Occupational History  . Occupation: retired  Scientific laboratory technician  . Financial resource strain: Not on file  . Food insecurity    Worry: Not on file    Inability: Not on file  . Transportation needs    Medical: Not on file    Non-medical: Not on file  Tobacco Use  . Smoking status: Former Smoker    Quit date: 07/08/1973    Years since quitting: 45.6  . Smokeless tobacco: Never Used  . Tobacco comment: used to smoke 1 PPD  Substance and Sexual Activity  . Alcohol use: No  .  Drug use: No  . Sexual activity: Not Currently  Lifestyle  . Physical activity    Days per week: Not on file    Minutes per session: Not on file  . Stress: Not on file  Relationships  . Social Herbalist on phone: Not on file    Gets together: Not on file    Attends religious service: Not on file    Active member of club or organization: Not on file    Attends meetings of clubs or organizations: Not on file    Relationship status: Not on file  Other Topics Concern  . Not on file  Social History Narrative   Lives w/ husband . 2 children live near by    Outpatient Encounter Medications as of 03/03/2019  Medication Sig  . hydrochlorothiazide (HYDRODIURIL) 25 MG tablet Take 1 tablet (25 mg total) by mouth daily.  Marland Kitchen lisinopril (PRINIVIL,ZESTRIL) 20 MG tablet Take 1 tablet (20 mg total) by mouth daily.  Marland Kitchen loratadine (CLARITIN) 10 MG tablet Take 1 tablet (10 mg total) by mouth daily.  . Multiple Vitamin (MULTIVITAMIN) capsule Take 1 capsule by mouth daily.    Marland Kitchen omeprazole (PRILOSEC) 20 MG capsule Take 1 capsule (20 mg total) by mouth daily as needed.  . verapamil (CALAN-SR) 240 MG CR tablet Take 1 tablet (240 mg total) by mouth at bedtime.   No facility-administered encounter medications on file as of 03/03/2019.     Activities of Daily Living In your present state of health, do you have any difficulty performing the following activities: 03/03/2019  Hearing? N  Vision? N  Difficulty concentrating or making decisions? N  Walking or climbing stairs? N  Dressing or bathing? N  Doing errands, shopping? N  Preparing Food and eating ? N  Using the Toilet? N  In the past six months, have you accidently leaked urine? N  Do you have problems with loss of bowel control? N  Managing your Medications? N  Managing your Finances? N  Housekeeping or managing your Housekeeping? N  Some recent data might be hidden    Patient Care Team: Colon Branch, MD as PCP - General Luberta Mutter, MD as Consulting Physician (Ophthalmology)    Assessment:   This is a routine wellness examination for Doris Mcdaniel. Physical assessment deferred to PCP.  Exercise Activities and Dietary recommendations Current Exercise Habits: The patient does not participate in regular exercise at present, Exercise limited by: None identified   Diet (meal preparation, eat out, water intake, caffeinated beverages, dairy products, fruits and vegetables): 24 hr recall Breakfast:sausage biscuit Lunch: tomato sandwich Dinner: chkn, salad, potato, watermelon Drinks 2-3 bottles of water  per day   Goals    . Maintain current health     "Do what I want to do such as mow and clean"       Fall Risk Fall Risk  03/03/2019 02/17/2018 01/17/2017 07/09/2016 07/05/2015  Falls in the past year? 0 No No No No   Depression Screen PHQ 2/9 Scores 03/03/2019 02/17/2018 01/17/2017 07/09/2016  PHQ - 2 Score 0 1 1 0  PHQ- 9 Score - - - -     Cognitive Function Ad8 score reviewed for issues:  Issues making decisions:no  Less interest in hobbies / activities:no  Repeats questions, stories (family complaining):no  Trouble using ordinary gadgets (microwave, computer, phone):no  Forgets the month or year: no  Mismanaging finances: no  Remembering appts:no  Daily problems with thinking and/or memory:no Ad8 score is=0   MMSE - Mini Mental State Exam 02/17/2018 01/17/2017  Orientation to time 5 5  Orientation to Place 5 5  Registration 3 3  Attention/ Calculation 5 5  Recall 2 2  Language- name 2 objects 2 2  Language- repeat 1 1  Language- follow 3 step command 3 3  Language- read & follow direction 1 1  Write a sentence 1 1  Copy design 1 0  Total score 29 28        Immunization History  Administered Date(s) Administered  . Influenza Split 07/09/2011, 06/24/2012  . Influenza Whole 05/05/2008, 05/06/2009, 07/12/2010  . Influenza, High Dose Seasonal PF 06/30/2013, 07/05/2015, 07/09/2016, 07/11/2017,  07/14/2018  . Influenza,inj,Quad PF,6+ Mos 07/02/2014  . Pneumococcal Conjugate-13 07/02/2014  . Pneumococcal Polysaccharide-23 07/13/2006, 07/14/2018  . Td 05/21/2007, 07/11/2017    Screening Tests Health Maintenance  Topic Date Due  . INFLUENZA VACCINE  03/14/2019  . TETANUS/TDAP  07/12/2027  . DEXA SCAN  Completed  . PNA vac Low Risk Adult  Completed     Plan:   See you next year!  Continue to eat heart healthy diet (full of fruits, vegetables, whole grains, lean protein, water--limit salt, fat, and sugar intake) and increase physical activity as tolerated.  Continue doing brain stimulating activities (puzzles, reading, adult coloring books, staying active) to keep memory sharp.   Bring a copy of your living will and/or healthcare power of attorney to your next office visit.   I have personally reviewed and noted the following in the patient's chart:   . Medical and social history . Use of alcohol, tobacco or illicit drugs  . Current medications and supplements . Functional ability and status . Nutritional status . Physical activity . Advanced directives . List of other physicians . Hospitalizations, surgeries, and ER visits in previous 12 months . Vitals . Screenings to include cognitive, depression, and falls . Referrals and appointments  In addition, I have reviewed and discussed with patient certain preventive protocols, quality metrics, and best practice recommendations. A written personalized care plan for preventive services as well as general preventive health recommendations were provided to patient.     Naaman Plummer [place], [place]  03/03/2019    Kathlene November, MD

## 2019-03-03 ENCOUNTER — Ambulatory Visit (INDEPENDENT_AMBULATORY_CARE_PROVIDER_SITE_OTHER): Payer: PPO | Admitting: *Deleted

## 2019-03-03 ENCOUNTER — Other Ambulatory Visit: Payer: Self-pay

## 2019-03-03 ENCOUNTER — Encounter: Payer: Self-pay | Admitting: *Deleted

## 2019-03-03 VITALS — BP 128/60 | HR 67

## 2019-03-03 DIAGNOSIS — Z0000 Encounter for general adult medical examination without abnormal findings: Secondary | ICD-10-CM

## 2019-03-03 NOTE — Patient Instructions (Signed)
See you next year!  Continue to eat heart healthy diet (full of fruits, vegetables, whole grains, lean protein, water--limit salt, fat, and sugar intake) and increase physical activity as tolerated.  Continue doing brain stimulating activities (puzzles, reading, adult coloring books, staying active) to keep memory sharp.   Bring a copy of your living will and/or healthcare power of attorney to your next office visit.   Doris Mcdaniel , Thank you for taking time to come for your Medicare Wellness Visit. I appreciate your ongoing commitment to your health goals. Please review the following plan we discussed and let me know if I can assist you in the future.   These are the goals we discussed: Goals    . Maintain current health     "Do what I want to do such as mow and clean"       This is a list of the screening recommended for you and due dates:  Health Maintenance  Topic Date Due  . Flu Shot  03/14/2019  . Tetanus Vaccine  07/12/2027  . DEXA scan (bone density measurement)  Completed  . Pneumonia vaccines  Completed    Health Maintenance After Age 4 After age 54, you are at a higher risk for certain long-term diseases and infections as well as injuries from falls. Falls are a major cause of broken bones and head injuries in people who are older than age 65. Getting regular preventive care can help to keep you healthy and well. Preventive care includes getting regular testing and making lifestyle changes as recommended by your health care provider. Talk with your health care provider about:  Which screenings and tests you should have. A screening is a test that checks for a disease when you have no symptoms.  A diet and exercise plan that is right for you. What should I know about screenings and tests to prevent falls? Screening and testing are the best ways to find a health problem early. Early diagnosis and treatment give you the best chance of managing medical conditions that  are common after age 36. Certain conditions and lifestyle choices may make you more likely to have a fall. Your health care provider may recommend:  Regular vision checks. Poor vision and conditions such as cataracts can make you more likely to have a fall. If you wear glasses, make sure to get your prescription updated if your vision changes.  Medicine review. Work with your health care provider to regularly review all of the medicines you are taking, including over-the-counter medicines. Ask your health care provider about any side effects that may make you more likely to have a fall. Tell your health care provider if any medicines that you take make you feel dizzy or sleepy.  Osteoporosis screening. Osteoporosis is a condition that causes the bones to get weaker. This can make the bones weak and cause them to break more easily.  Blood pressure screening. Blood pressure changes and medicines to control blood pressure can make you feel dizzy.  Strength and balance checks. Your health care provider may recommend certain tests to check your strength and balance while standing, walking, or changing positions.  Foot health exam. Foot pain and numbness, as well as not wearing proper footwear, can make you more likely to have a fall.  Depression screening. You may be more likely to have a fall if you have a fear of falling, feel emotionally low, or feel unable to do activities that you used to do.  Alcohol  use screening. Using too much alcohol can affect your balance and may make you more likely to have a fall. What actions can I take to lower my risk of falls? General instructions  Talk with your health care provider about your risks for falling. Tell your health care provider if: ? You fall. Be sure to tell your health care provider about all falls, even ones that seem minor. ? You feel dizzy, sleepy, or off-balance.  Take over-the-counter and prescription medicines only as told by your health  care provider. These include any supplements.  Eat a healthy diet and maintain a healthy weight. A healthy diet includes low-fat dairy products, low-fat (lean) meats, and fiber from whole grains, beans, and lots of fruits and vegetables. Home safety  Remove any tripping hazards, such as rugs, cords, and clutter.  Install safety equipment such as grab bars in bathrooms and safety rails on stairs.  Keep rooms and walkways well-lit. Activity   Follow a regular exercise program to stay fit. This will help you maintain your balance. Ask your health care provider what types of exercise are appropriate for you.  If you need a cane or walker, use it as recommended by your health care provider.  Wear supportive shoes that have nonskid soles. Lifestyle  Do not drink alcohol if your health care provider tells you not to drink.  If you drink alcohol, limit how much you have: ? 0-1 drink a day for women. ? 0-2 drinks a day for men.  Be aware of how much alcohol is in your drink. In the [place] one drink equals one typical bottle of beer (12 oz), one-half glass of wine (5 oz), or one shot of hard liquor (1 oz).  Do not use any products that contain nicotine or tobacco, such as cigarettes and e-cigarettes. If you need help quitting, ask your health care provider. Summary  Having a healthy lifestyle and getting preventive care can help to protect your health and wellness after age 81.  Screening and testing are the best way to find a health problem early and help you avoid having a fall. Early diagnosis and treatment give you the best chance for managing medical conditions that are more common for people who are older than age 80.  Falls are a major cause of broken bones and head injuries in people who are older than age 56. Take precautions to prevent a fall at home.  Work with your health care provider to learn what changes you can make to improve your health and wellness and to prevent falls.  This information is not intended to replace advice given to you by your health care provider. Make sure you discuss any questions you have with your health care provider. Document Released: 06/12/2017 Document Revised: 11/20/2018 Document Reviewed: 06/12/2017 Elsevier Patient Education  2020 [employer].

## 2019-03-08 ENCOUNTER — Emergency Department (HOSPITAL_COMMUNITY)
Admission: EM | Admit: 2019-03-08 | Discharge: 2019-03-08 | Disposition: A | Discharge status: Home / Self Care | Payer: PPO | Attending: Emergency Medicine | Admitting: Emergency Medicine

## 2019-03-08 ENCOUNTER — Emergency Department (HOSPITAL_COMMUNITY): Payer: PPO

## 2019-03-08 ENCOUNTER — Encounter (HOSPITAL_COMMUNITY): Payer: Self-pay | Admitting: Emergency Medicine

## 2019-03-08 DIAGNOSIS — Y939 Activity, unspecified: Secondary | ICD-10-CM | POA: Diagnosis not present

## 2019-03-08 DIAGNOSIS — Y929 Unspecified place or not applicable: Secondary | ICD-10-CM | POA: Diagnosis not present

## 2019-03-08 DIAGNOSIS — Y9301 Activity, walking, marching and hiking: Secondary | ICD-10-CM | POA: Diagnosis not present

## 2019-03-08 DIAGNOSIS — Y999 Unspecified external cause status: Secondary | ICD-10-CM | POA: Diagnosis not present

## 2019-03-08 DIAGNOSIS — M25551 Pain in right hip: Secondary | ICD-10-CM | POA: Insufficient documentation

## 2019-03-08 DIAGNOSIS — I10 Essential (primary) hypertension: Secondary | ICD-10-CM | POA: Diagnosis not present

## 2019-03-08 DIAGNOSIS — Z79899 Other long term (current) drug therapy: Secondary | ICD-10-CM | POA: Insufficient documentation

## 2019-03-08 DIAGNOSIS — M25561 Pain in right knee: Secondary | ICD-10-CM | POA: Diagnosis not present

## 2019-03-08 DIAGNOSIS — S8991XA Unspecified injury of right lower leg, initial encounter: Secondary | ICD-10-CM | POA: Diagnosis present

## 2019-03-08 DIAGNOSIS — Z853 Personal history of malignant neoplasm of breast: Secondary | ICD-10-CM | POA: Insufficient documentation

## 2019-03-08 DIAGNOSIS — X58XXXA Exposure to other specified factors, initial encounter: Secondary | ICD-10-CM | POA: Diagnosis not present

## 2019-03-08 MED ORDER — ACETAMINOPHEN 325 MG PO TABS
650.0000 mg | ORAL_TABLET | Freq: Once | ORAL | Status: AC
  Administered 2019-03-08: 650 mg via ORAL
  Filled 2019-03-08: qty 2

## 2019-03-08 NOTE — ED Notes (Signed)
Patient transported to X-ray 

## 2019-03-08 NOTE — ED Notes (Signed)
C/o "Knee gave out on me yesterday-- now it hurts to bend, to walk, to move." normally walks without assistance. Positive pedal pulses - equal bilateral. No swelling noted.

## 2019-03-08 NOTE — ED Notes (Signed)
Ortho at bedside.

## 2019-03-08 NOTE — ED Provider Notes (Signed)
[facility]PART[facility] Provider Note   CSN: [id number] Arrival date & time: 03/08/19  0908     History   Chief Complaint Chief Complaint  Patient presents with  . Knee Pain  . Fall    HPI Doris Mcdaniel is a 83 y.o. female.     HPI  83 year old female presents today complaining of right knee pain.  She states that she was going up steps last night when she heard a crack in her right knee and felt pain in the knee.  She has been able to ambulate with a cane since that time but is very painful especially to bend and sit on the commode.  She did not fall denies any other injury.  Has not noted redness or swelling.  Past Medical History:  Diagnosis Date  . Abnormal x-Leylani Duley of spine    Mild Compression deformity of the the T8 vertebral body, age indertiminate   . Allergic rhinitis   . GERD (gastroesophageal reflux disease)   . History of breast cancer 2012   left breast  . Hyperlipidemia   . Hypertension   . Personal history of radiation therapy     Patient Active Problem List   Diagnosis Date Noted  . PCP NOTES >>>>> 07/05/2015  . Depression 06/24/2012  . History of breast cancer   . Breast cancer, stage 0 03/08/2011  . Annual physical exam 01/12/2011  . DYSPNEA ON EXERTION 05/06/2009  . Compression fracture of spine-- DEXA (-) 2014, declined meds  11/04/2008  . ALLERGIC RHINITIS 11/03/2008  . *GERD 09/23/2007  . Joint pain 11/19/2006  . Hyperlipidemia  09/17/2006  . pcp notes---HTN 09/17/2006    Past Surgical History:  Procedure Laterality Date  . ABDOMINAL HYSTERECTOMY    . BREAST BIOPSY Left 01/24/2011  . BREAST LUMPECTOMY Left 02/21/2011  . BREAST SURGERY     lumpectomy (L)  . CATARACT EXTRACTION     B  . CHOLECYSTECTOMY  03/2008  . OOPHORECTOMY     they left a part of the ovarly     OB History   No obstetric history on file.      Home Medications    Prior to Admission medications   Medication Sig Start Date End  Date Taking? Authorizing Provider  hydrochlorothiazide (HYDRODIURIL) 25 MG tablet Take 1 tablet (25 mg total) by mouth daily. 07/30/18   Colon Branch, MD  lisinopril (PRINIVIL,ZESTRIL) 20 MG tablet Take 1 tablet (20 mg total) by mouth daily. 07/30/18   Colon Branch, MD  loratadine (CLARITIN) 10 MG tablet Take 1 tablet (10 mg total) by mouth daily. 12/28/13   Colon Branch, MD  Multiple Vitamin (MULTIVITAMIN) capsule Take 1 capsule by mouth daily.      [provider]  omeprazole (PRILOSEC) 20 MG capsule Take 1 capsule (20 mg total) by mouth daily as needed. 07/11/17   Colon Branch, MD  verapamil (CALAN-SR) 240 MG CR tablet Take 1 tablet (240 mg total) by mouth at bedtime. 07/30/18   Colon Branch, MD    Family History Family History  Problem Relation Age of Onset  . Heart disease Brother        CABG age ~ 19  . Colon cancer Neg Hx   . Breast cancer Neg Hx   . Stroke Neg Hx   . Hypertension Neg Hx   . Diabetes Neg Hx     Social History Social History   Tobacco Use  . Smoking  status: Former Smoker    Quit date: 07/08/1973    Years since quitting: 45.6  . Smokeless tobacco: Never Used  . Tobacco comment: used to smoke 1 PPD  Substance Use Topics  . Alcohol use: No  . Drug use: No     Allergies   Patient has no known allergies.   Review of Systems Review of Systems  All other systems reviewed and are negative.    Physical Exam Updated Vital Signs BP (!) 137/57 (BP Location: Left Arm)   Pulse 70   Temp 98.1 F (36.7 C) (Oral)   Resp 16   SpO2 98%   Physical Exam Vitals signs and nursing note reviewed.  Constitutional:      Appearance: Normal appearance. She is normal weight.  HENT:     Head: Normocephalic.     Right Ear: External ear normal.     Left Ear: External ear normal.     Nose: Nose normal.     Mouth/Throat:     Mouth: Mucous membranes are moist.  Eyes:     Pupils: Pupils are equal, round, and reactive to light.  Neck:     Musculoskeletal:  Normal range of motion.  Cardiovascular:     Rate and Rhythm: Normal rate and regular rhythm.  Pulmonary:     Effort: Pulmonary effort is normal.  Abdominal:     General: Abdomen is flat.  Musculoskeletal: Normal range of motion.     Comments: Right hip is nontender to palpation Pelvis is stable and there is no tenderness palpation Right upper leg is nontender She has mild diffuse tenderness to palpation diffusely around the right knee with no evidence of effusion.  Ligaments appear stable on exam Dorsal talus pulses intact distal to the injury There is no skin abnormality noted.  Skin:    Capillary Refill: Capillary refill takes less than 2 seconds.  Neurological:     General: No focal deficit present.     Mental Status: She is alert and oriented to person, place, and time.  Psychiatric:        Mood and Affect: Mood normal.      ED Treatments / Results  Labs (all labs ordered are listed, but only abnormal results are displayed) Labs Reviewed - No data to display  EKG None  Radiology Dg Knee Complete 4 Views Right  Result Date: 03/08/2019 CLINICAL DATA:  83 year old female with history of trauma from a fall yesterday. Right knee pain. EXAM: RIGHT KNEE - COMPLETE 4+ VIEW COMPARISON:  No priors. FINDINGS: No acute displaced fracture, subluxation or dislocation. Joint space narrowing, subchondral sclerosis, subchondral cyst formation and osteophyte formation in a tricompartmental distribution, most severe in the medial and patellofemoral compartments, compatible with osteoarthritis. IMPRESSION: 1. Moderate tricompartmental osteoarthritis, most severe in the medial and patellofemoral compartments. 2. Negative for acute fracture. Electronically Signed   By: Vinnie Langton M.D.   On: 03/08/2019 10:42    Procedures Procedures (including critical care time)  Medications Ordered in ED Medications - No data to display   Initial Impression / Assessment and Plan / ED Course  I  have reviewed the triage vital signs and the nursing notes.  Pertinent labs & imaging results that were available during my care of the patient were reviewed by me and considered in my medical decision making (see chart for details).       83 year old female with right knee pain that began abruptly last night with no definitive trauma.  X-Demetrion Wesby shows tricompartmental  osteoarthritis with no evidence of fracture.  Plan Ace bandage and will refer to orthopedic surgery for follow-up Final Clinical Impressions(s) / ED Diagnoses   Final diagnoses:  Acute pain of right knee    ED Discharge Orders    None       Pattricia Boss, MD 03/08/19 1121

## 2019-03-08 NOTE — Progress Notes (Signed)
Orthopedic Tech Progress Note Patient Details:  Doris Mcdaniel 1935-09-13 [id number]  Ortho Devices Type of Ortho Device: Ace wrap Ortho Device/Splint Location: right Ortho Device/Splint Interventions: Application   Post Interventions Patient Tolerated: Well Instructions Provided: Care of device   [name] 03/08/2019, 11:31 AM

## 2019-03-08 NOTE — ED Notes (Signed)
Paged ortho 

## 2019-03-08 NOTE — ED Triage Notes (Signed)
Pt. Stated, I sorted lost my balance going down steps and I felt my knee crack. Im unable to walk of my leg.

## 2019-03-08 NOTE — Discharge Instructions (Addendum)
Use compression to comfort Keep right knee elevated and use cold therapy Please call orthopedic surgeon's office tomorrow for follow-up this week

## 2019-03-11 DIAGNOSIS — M25561 Pain in right knee: Secondary | ICD-10-CM | POA: Diagnosis not present

## 2019-03-17 DIAGNOSIS — G5601 Carpal tunnel syndrome, right upper limb: Secondary | ICD-10-CM | POA: Diagnosis not present

## 2019-04-08 DIAGNOSIS — M25561 Pain in right knee: Secondary | ICD-10-CM | POA: Diagnosis not present

## 2019-04-27 ENCOUNTER — Other Ambulatory Visit: Payer: Self-pay | Admitting: Internal Medicine

## 2019-05-09 ENCOUNTER — Other Ambulatory Visit: Payer: Self-pay | Admitting: Internal Medicine

## 2019-05-21 ENCOUNTER — Encounter: Payer: Self-pay | Admitting: Gastroenterology

## 2019-07-15 ENCOUNTER — Other Ambulatory Visit: Payer: Self-pay

## 2019-07-16 ENCOUNTER — Encounter: Payer: Self-pay | Admitting: Internal Medicine

## 2019-07-16 ENCOUNTER — Other Ambulatory Visit: Payer: Self-pay

## 2019-07-16 ENCOUNTER — Ambulatory Visit (INDEPENDENT_AMBULATORY_CARE_PROVIDER_SITE_OTHER): Payer: PPO | Admitting: Internal Medicine

## 2019-07-16 VITALS — BP 131/51 | HR 79 | Temp 97.6°F | Resp 18 | Ht 66.0 in | Wt 181.4 lb

## 2019-07-16 DIAGNOSIS — L989 Disorder of the skin and subcutaneous tissue, unspecified: Secondary | ICD-10-CM | POA: Diagnosis not present

## 2019-07-16 DIAGNOSIS — Z1231 Encounter for screening mammogram for malignant neoplasm of breast: Secondary | ICD-10-CM | POA: Diagnosis not present

## 2019-07-16 DIAGNOSIS — Z23 Encounter for immunization: Secondary | ICD-10-CM

## 2019-07-16 DIAGNOSIS — Z0000 Encounter for general adult medical examination without abnormal findings: Secondary | ICD-10-CM | POA: Diagnosis not present

## 2019-07-16 DIAGNOSIS — Z853 Personal history of malignant neoplasm of breast: Secondary | ICD-10-CM

## 2019-07-16 DIAGNOSIS — E785 Hyperlipidemia, unspecified: Secondary | ICD-10-CM

## 2019-07-16 LAB — LIPID PANEL
Cholesterol: 212 mg/dL — ABNORMAL HIGH (ref 0–200)
HDL: 49.4 mg/dL (ref >39.00)
LDL Cholesterol: 135 mg/dL — ABNORMAL HIGH (ref 0–99)
NonHDL: 163.01
Total CHOL/HDL Ratio: 4
Triglycerides: 140 mg/dL (ref 0.0–149.0)
VLDL: 28 mg/dL (ref 0.0–40.0)

## 2019-07-16 LAB — CBC WITH DIFFERENTIAL/PLATELET
Basophils Absolute: 0.1 10*3/uL (ref 0.0–0.1)
Basophils Relative: 1.6 % (ref 0.0–3.0)
Eosinophils Absolute: 0.2 10*3/uL (ref 0.0–0.7)
Eosinophils Relative: 2.9 % (ref 0.0–5.0)
HCT: 38.9 % (ref 36.0–46.0)
Hemoglobin: 12.5 g/dL (ref 12.0–15.0)
Lymphocytes Relative: 19.1 % (ref 12.0–46.0)
Lymphs Abs: 1.5 10*3/uL (ref 0.7–4.0)
MCHC: 32.2 g/dL (ref 30.0–36.0)
MCV: 87.7 fl (ref 78.0–100.0)
Monocytes Absolute: 0.6 10*3/uL (ref 0.1–1.0)
Monocytes Relative: 7.3 % (ref 3.0–12.0)
Neutro Abs: 5.5 10*3/uL (ref 1.4–7.7)
Neutrophils Relative %: 69.1 % (ref 43.0–77.0)
Platelets: 288 10*3/uL (ref 150.0–400.0)
RBC: 4.44 Mil/uL (ref 3.87–5.11)
RDW: 13.6 % (ref 11.5–15.5)
WBC: 7.9 10*3/uL (ref 4.0–10.5)

## 2019-07-16 LAB — COMPREHENSIVE METABOLIC PANEL
ALT: 9 U/L (ref 0–35)
AST: 13 U/L (ref 0–37)
Albumin: 4.3 g/dL (ref 3.5–5.2)
Alkaline Phosphatase: 58 U/L (ref 39–117)
BUN: 22 mg/dL (ref 6–23)
CO2: 28 mEq/L (ref 19–32)
Calcium: 9.1 mg/dL (ref 8.4–10.5)
Chloride: 101 mEq/L (ref 96–112)
Creatinine, Ser: 1.34 mg/dL — ABNORMAL HIGH (ref 0.40–1.20)
GFR: 37.97 mL/min — ABNORMAL LOW (ref >60.00)
Glucose, Bld: 110 mg/dL — ABNORMAL HIGH (ref 70–99)
Potassium: 3.9 mEq/L (ref 3.5–5.1)
Sodium: 140 mEq/L (ref 135–145)
Total Bilirubin: 0.6 mg/dL (ref 0.2–1.2)
Total Protein: 6.8 g/dL (ref 6.0–8.3)

## 2019-07-16 MED ORDER — OMEPRAZOLE 20 MG PO CPDR: 20.0000 mg | DELAYED_RELEASE_CAPSULE | Freq: Every day | ORAL | 3 refills | Status: DC | PRN

## 2019-07-16 MED ORDER — LISINOPRIL 20 MG PO TABS: 20.0000 mg | ORAL_TABLET | Freq: Every day | ORAL | 3 refills | Status: DC

## 2019-07-16 MED ORDER — HYDROCHLOROTHIAZIDE 25 MG PO TABS: 25.0000 mg | ORAL_TABLET | Freq: Every day | ORAL | 3 refills | Status: DC

## 2019-07-16 MED ORDER — VERAPAMIL HCL ER 240 MG PO TBCR: 240.0000 mg | EXTENDED_RELEASE_TABLET | Freq: Every day | ORAL | 3 refills | Status: DC

## 2019-07-16 NOTE — Progress Notes (Signed)
Subjective:    Patient ID: Doris Mcdaniel, female    DOB: [birth year], 83 y.o.   MRN: [id number]  DOS:  07/16/2019 Type of visit - description: CPX The patient is stressed about the quarantine and being separated from her family. Also having a hard lesion at the left side of the face for 3 to 4 months, this morning as she was washing her face it "fell off".   Review of Systems No major problems with anxiety, depression, no suicidal ideas  Other than above, a 14 point review of systems is negative    Past Medical History:  Diagnosis Date  . Abnormal x-ray of spine    Mild Compression deformity of the the T8 vertebral body, age indertiminate   . Allergic rhinitis   . GERD (gastroesophageal reflux disease)   . History of breast cancer 2012   left breast  . Hyperlipidemia   . Hypertension   . Personal history of radiation therapy     Past Surgical History:  Procedure Laterality Date  . ABDOMINAL HYSTERECTOMY    . BREAST BIOPSY Left 01/24/2011  . BREAST LUMPECTOMY Left 02/21/2011  . BREAST SURGERY     lumpectomy (L)  . CATARACT EXTRACTION     B  . CHOLECYSTECTOMY  03/2008  . OOPHORECTOMY     they left a part of the ovarly   Family History  Problem Relation Age of Onset  . Heart disease Brother        CABG age ~ 27  . Colon cancer Neg Hx   . Breast cancer Neg Hx   . Stroke Neg Hx   . Hypertension Neg Hx   . Diabetes Neg Hx     Social History   Socioeconomic History  . Marital status: Married    Spouse name: Not on file  . Number of children: 2  . Years of education: Not on file  . Highest education level: Not on file  Occupational History  . Occupation: retired  Scientific laboratory technician  . Financial resource strain: Not on file  . Food insecurity    Worry: Not on file    Inability: Not on file  . Transportation needs    Medical: Not on file    Non-medical: Not on file  Tobacco Use  . Smoking status: Former Smoker    Quit date: 07/08/1973    Years since  quitting: 46.0  . Smokeless tobacco: Never Used  . Tobacco comment: used to smoke 1 PPD  Substance and Sexual Activity  . Alcohol use: No  . Drug use: No  . Sexual activity: Not Currently  Lifestyle  . Physical activity    Days per week: Not on file    Minutes per session: Not on file  . Stress: Not on file  Relationships  . Social Herbalist on phone: Not on file    Gets together: Not on file    Attends religious service: Not on file    Active member of club or organization: Not on file    Attends meetings of clubs or organizations: Not on file    Relationship status: Not on file  . Intimate partner violence    Fear of current or ex partner: Not on file    Emotionally abused: Not on file    Physically abused: Not on file    Forced sexual activity: Not on file  Other Topics Concern  . Not on file  Social History Narrative  Lives w/ husband . 2 children live near by      Allergies as of 07/16/2019   No Known Allergies     Medication List       Accurate as of July 16, 2019 11:59 PM. If you have any questions, ask your nurse or doctor.        hydrochlorothiazide 25 MG tablet Commonly known as: HYDRODIURIL Take 1 tablet (25 mg total) by mouth daily.   lisinopril 20 MG tablet Commonly known as: ZESTRIL Take 1 tablet (20 mg total) by mouth daily.   loratadine 10 MG tablet Commonly known as: CLARITIN Take 1 tablet (10 mg total) by mouth daily.   multivitamin capsule Take 1 capsule by mouth daily.   omeprazole 20 MG capsule Commonly known as: PRILOSEC Take 1 capsule (20 mg total) by mouth daily as needed.   verapamil 240 MG CR tablet Commonly known as: CALAN-SR Take 1 tablet (240 mg total) by mouth at bedtime.           Objective:   Physical Exam HENT:     Head:     BP (!) 131/51 (BP Location: Left Arm, Patient Position: Sitting, Cuff Size: Normal)   Pulse 79   Temp 97.6 F (36.4 C) (Temporal)   Resp 18   Ht 5\' 6"  (1.676 m)   Wt  181 lb 6 oz (82.3 kg)   SpO2 98%   BMI 29.27 kg/m  General: Well developed, NAD, BMI noted Neck: No  thyromegaly  HEENT:  Normocephalic . Face symmetric, atraumatic Breast exam: No dominant mass on either side, multiple SKs noted at the breast and surrounding areas, nipple with no discharge.  No axillary lymphadenopathies Lungs:  CTA B Normal respiratory effort, no intercostal retractions, no accessory muscle use. Heart: RRR,  no murmur.  No pretibial edema bilaterally  Abdomen:  Not distended, soft, non-tender. No rebound or rigidity.   Neurologic:  alert & oriented X3.  Speech normal, gait appropriate for age and unassisted Strength symmetric and appropriate for age.  Psych: Cognition and judgment appear intact.  Cooperative with normal attention span and concentration.  Behavior appropriate. No anxious or depressed appearing.     Assessment     Assessment  HTN Hyperlipidemia GERD DJD  Breast cancer, 2012, left, lumpectomy, XRT. Does not see oncology regularly  MSK: --T8  mild compression per x-ray, declined Rx  --DEXA 2014 and  2018: Normal. Vitamin D normal when checked  PLAN  Here for CPX HTN: ContinueHCTZ, lisinopril, Calan, checking labs.  Recommend ambulatory BPs Hyperlipidemia: Diet controlled, checking labs GERD: Refill omeprazole, controlled. History of breast cancer: Breast exam today negative, schedule a mammogram MSK: Having problems with her right knee, CTS. recommend to see Ortho if problems continue    Skin lesion: As described above, lesion "fell off" today, suspect SK, refer to dermatology.  If the area where she had the lesion gets red/swollen she needs to call for antibiotics.  Keep the area clean and dry RTC 1 year   This visit occurred during the SARS-CoV-2 public health emergency.  Safety protocols were in place, including screening questions prior to the visit, additional usage of staff PPE, and extensive cleaning of exam room while observing  appropriate contact time as indicated for disinfecting solutions.

## 2019-07-16 NOTE — Progress Notes (Signed)
Pre visit review using our clinic review tool, if applicable. No additional management support is needed unless otherwise documented below in the visit note. 

## 2019-07-16 NOTE — Assessment & Plan Note (Signed)
-  Td 06-2017 ; pneumonia shot: 2007 and 07/2018 ; prevnar: 2015  - strongly declined shingrix  - flu shot today -No furthert PAPs, see previous entries  -H/o breast ca, see assessment  A/p -CCS: last colonoscopy 06/2009, negative.  Discussed pros/cons of further colonoscopies, she declines, that is within the guidelines.  No further screening. -Labs: CMP, FLP, CBC  -Diet and exercise discussed

## 2019-07-16 NOTE — Patient Instructions (Addendum)
GO TO THE LAB : Get the blood work     GO TO THE FRONT DESK Schedule your next appointment   for a physical exam in 1 year   Check the  blood pressure 2 or 3 times a month  BP GOAL is between 110/65 and  135/85. If it is consistently higher or lower, let me know  We are referring you to the dermatologist

## 2019-07-18 NOTE — Assessment & Plan Note (Signed)
Here for CPX HTN: ContinueHCTZ, lisinopril, Calan, checking labs.  Recommend ambulatory BPs Hyperlipidemia: Diet controlled, checking labs GERD: Refill omeprazole, controlled. History of breast cancer: Breast exam today negative, schedule a mammogram MSK: Having problems with her right knee, CTS. recommend to see Ortho if problems continue    Skin lesion: As described above, lesion "fell off" today, suspect SK, refer to dermatology.  If the area where she had the lesion gets red/swollen she needs to call for antibiotics.  Keep the area clean and dry RTC 1 year

## 2019-07-20 ENCOUNTER — Other Ambulatory Visit: Payer: Self-pay | Admitting: Internal Medicine

## 2019-07-20 DIAGNOSIS — Z1231 Encounter for screening mammogram for malignant neoplasm of breast: Secondary | ICD-10-CM

## 2019-07-20 DIAGNOSIS — Z853 Personal history of malignant neoplasm of breast: Secondary | ICD-10-CM

## 2019-07-21 ENCOUNTER — Telehealth: Payer: Self-pay | Admitting: Internal Medicine

## 2019-07-21 MED ORDER — ATORVASTATIN CALCIUM 20 MG PO TABS: 20.0000 mg | ORAL_TABLET | Freq: Every day | ORAL | 3 refills | Status: DC

## 2019-07-21 NOTE — Telephone Encounter (Signed)
Called pt left msg to call back for fasting lab appt in 2 months

## 2019-07-21 NOTE — Telephone Encounter (Signed)
-----   Message from [place], [place] sent at 07/21/2019  4:12 PM EST ----- Lipitor 20mg  sent to [facility]. AST, ALT, Lipid panel ordered.   Sherri- can you call Pt to set up fasting labs to be completed in 2 months please?

## 2019-07-21 NOTE — Addendum Note (Signed)
Addended byDamita Dunnings D on: 07/21/2019 04:11 PM   Modules accepted: Orders

## 2019-09-06 IMAGING — DX RIGHT KNEE - COMPLETE 4+ VIEW
4 series · 4 of 4 positions shown · non-contrast
Comparison: No priors.

CLINICAL DATA: 82-year-old female with history of trauma from a
fall yesterday. Right knee pain.

EXAM:
RIGHT KNEE - COMPLETE 4+ VIEW

[knee ap]
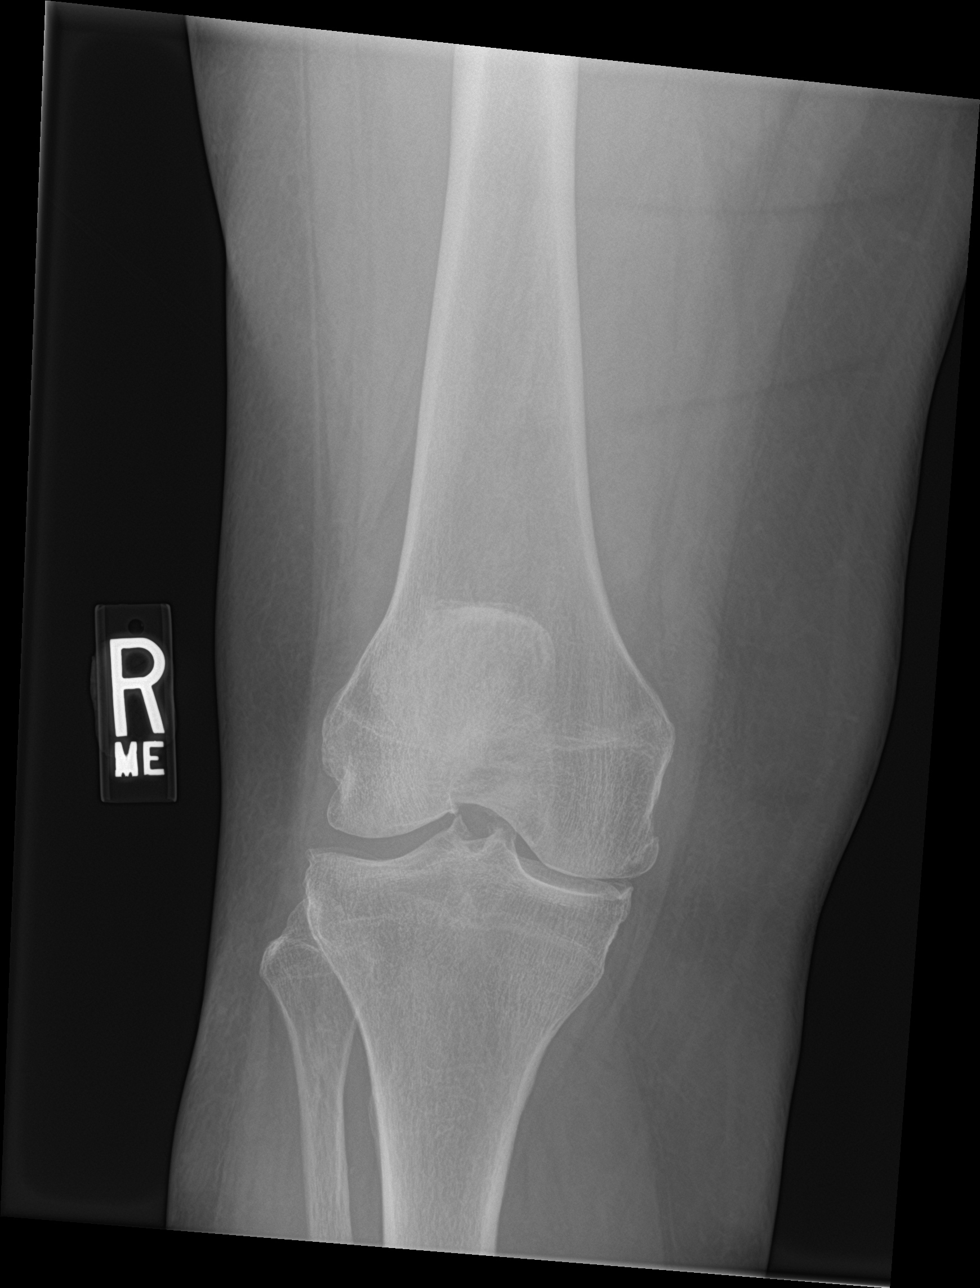

[tunnel]
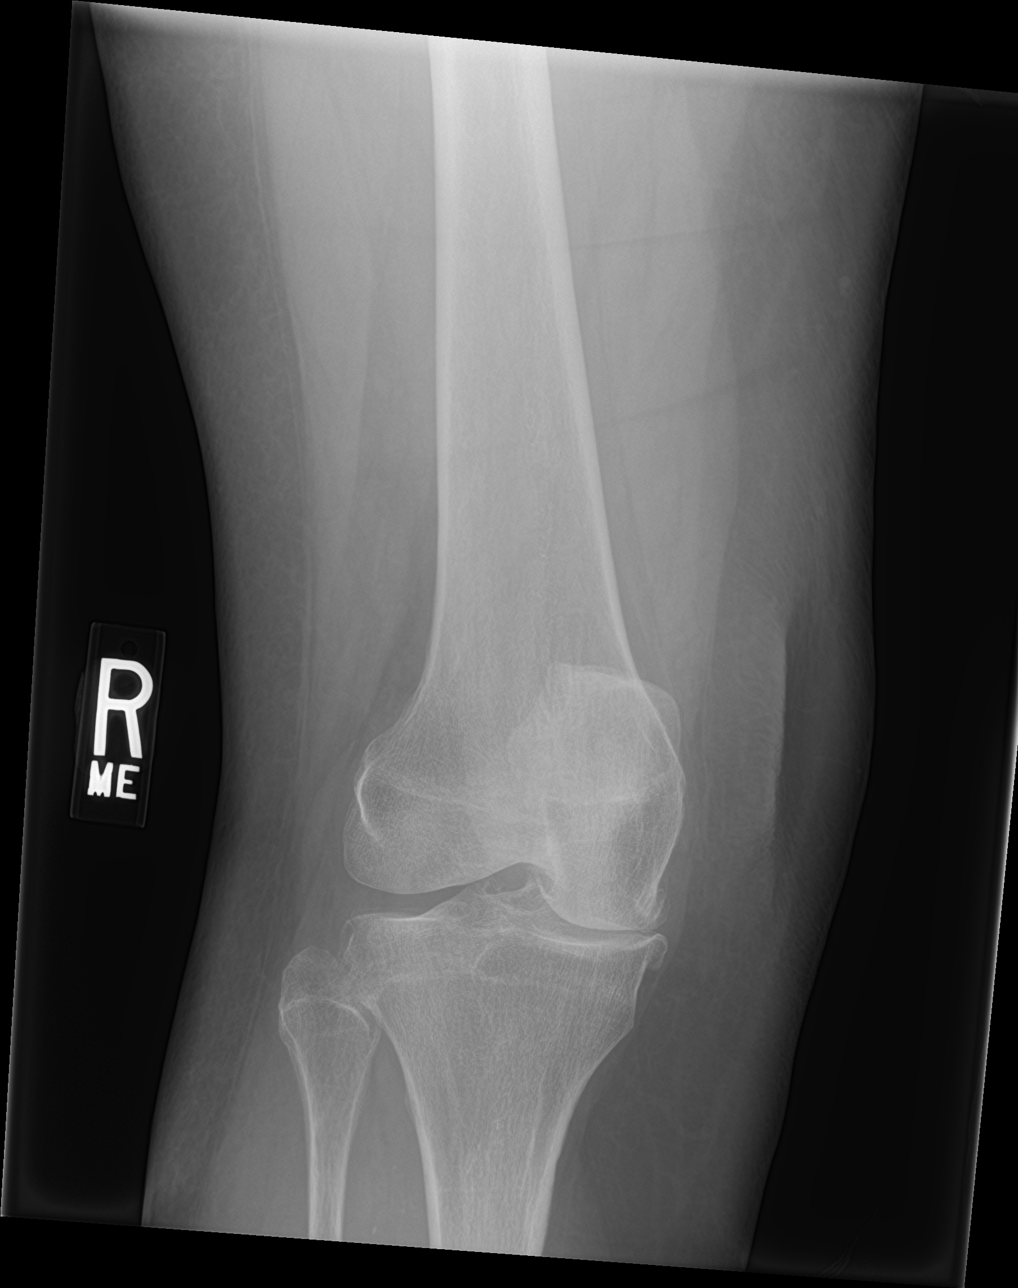

[knee lat]
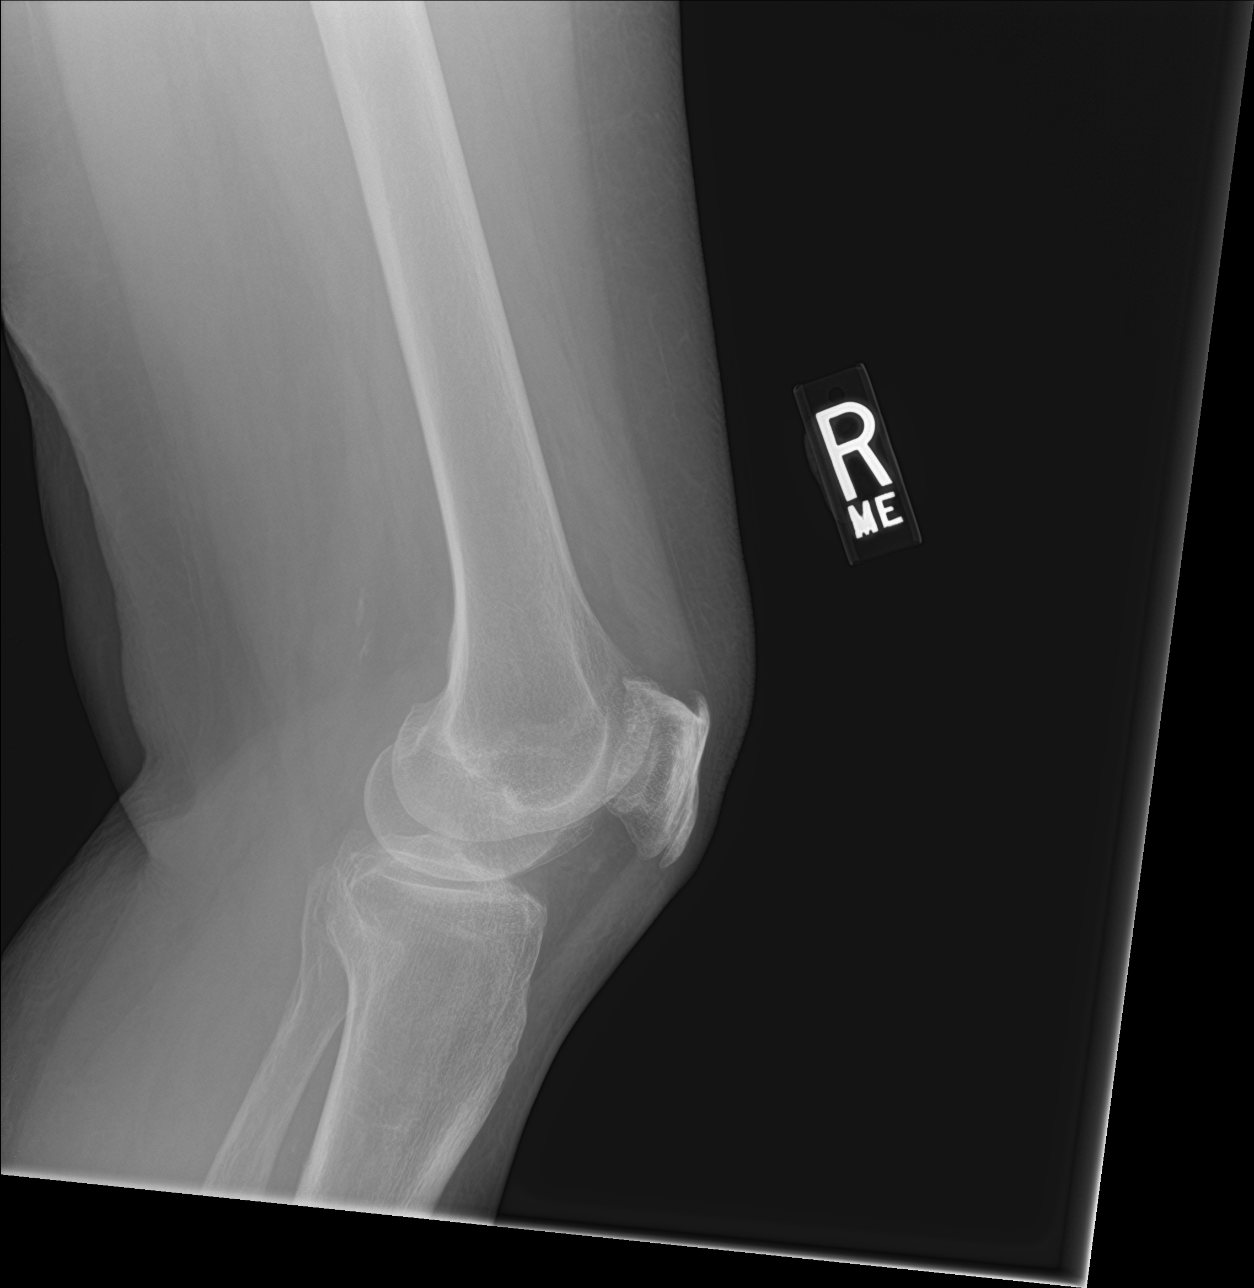

[knee obl]
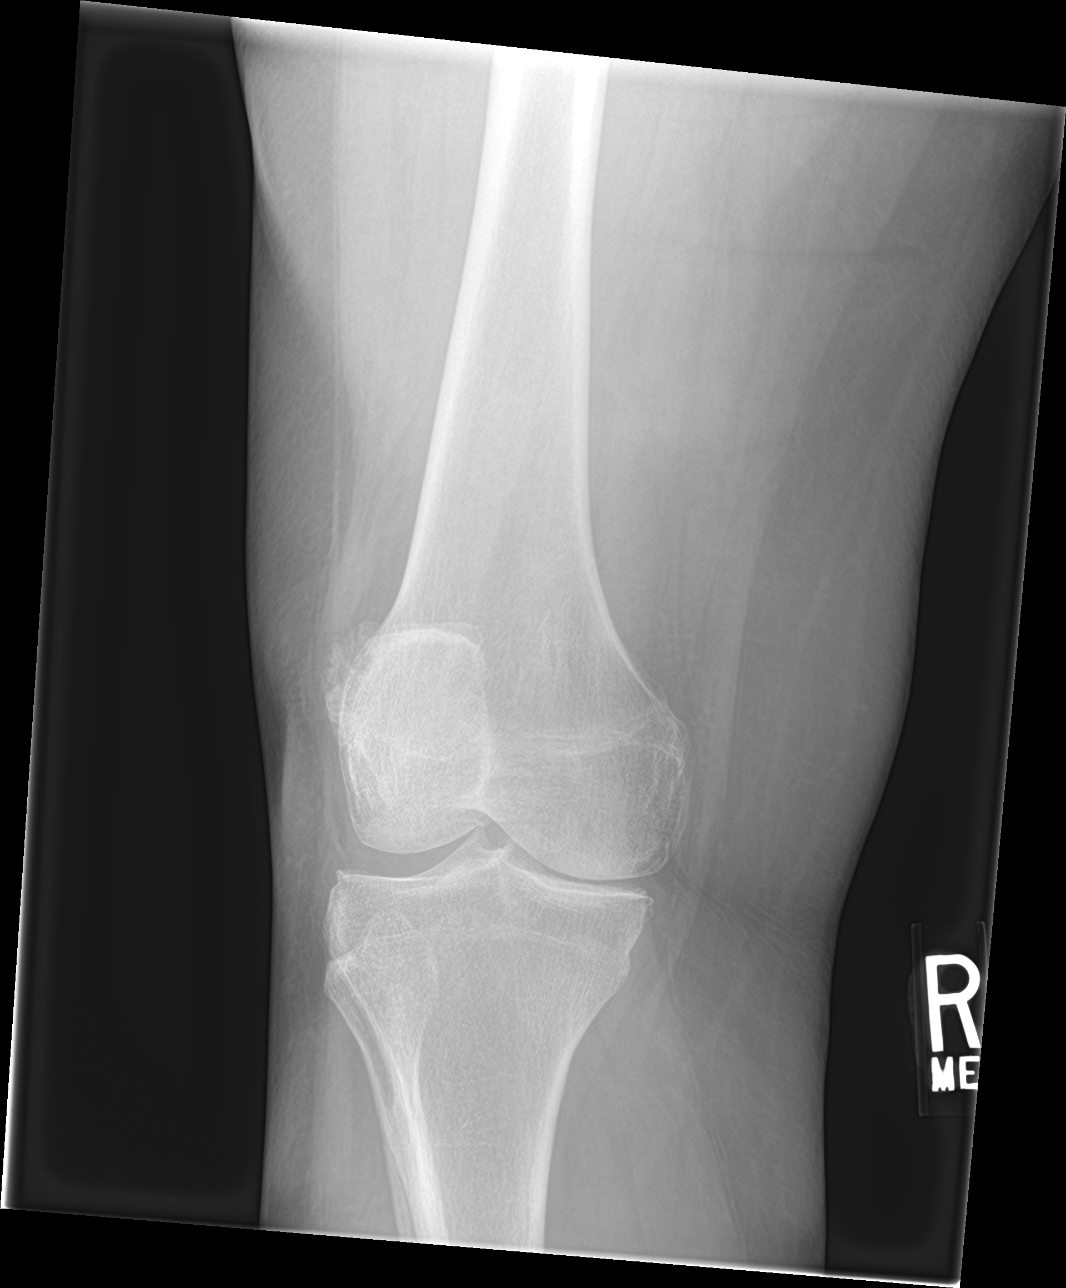

[4 of 4 positions shown; findings below may reference images not displayed]

FINDINGS: No acute displaced fracture, subluxation or dislocation. Joint space
narrowing, subchondral sclerosis, subchondral cyst formation and
osteophyte formation in a tricompartmental distribution, most severe
in the medial and patellofemoral compartments, compatible with
osteoarthritis.
IMPRESSION: 1. Moderate tricompartmental osteoarthritis, most severe in the
medial and patellofemoral compartments.
2. Negative for acute fracture.

## 2019-09-22 ENCOUNTER — Telehealth: Payer: Self-pay | Admitting: *Deleted

## 2019-09-22 ENCOUNTER — Other Ambulatory Visit (INDEPENDENT_AMBULATORY_CARE_PROVIDER_SITE_OTHER): Payer: PPO

## 2019-09-22 ENCOUNTER — Other Ambulatory Visit: Payer: Self-pay

## 2019-09-22 DIAGNOSIS — E785 Hyperlipidemia, unspecified: Secondary | ICD-10-CM

## 2019-09-22 LAB — LIPID PANEL
Cholesterol: 130 mg/dL (ref 0–200)
HDL: 49.5 mg/dL (ref >39.00)
LDL Cholesterol: 58 mg/dL (ref 0–99)
NonHDL: 80.13
Total CHOL/HDL Ratio: 3
Triglycerides: 113 mg/dL (ref 0.0–149.0)
VLDL: 22.6 mg/dL (ref 0.0–40.0)

## 2019-09-22 LAB — ALT: ALT: 10 U/L (ref 0–35)

## 2019-09-22 LAB — AST: AST: 14 U/L (ref 0–37)

## 2019-09-22 NOTE — Telephone Encounter (Signed)
Pt wanted to let PCP know that she received her first COVID 19 vaccine last Thursday. Immunization list has been updated.

## 2019-09-22 NOTE — Telephone Encounter (Signed)
Noted, thank you

## 2019-09-24 MED ORDER — ATORVASTATIN CALCIUM 20 MG PO TABS: 20.0000 mg | ORAL_TABLET | Freq: Every day | ORAL | 3 refills | Status: DC

## 2019-09-24 NOTE — Addendum Note (Signed)
Addended byDamita Dunnings D on: 09/24/2019 01:49 PM   Modules accepted: Orders

## 2019-09-29 ENCOUNTER — Telehealth: Payer: Self-pay | Admitting: Internal Medicine

## 2019-09-29 NOTE — Chronic Care Management (AMB) (Signed)
  Chronic Care Management   Note  09/29/2019 Name: Doris Mcdaniel MRN: [id number] DOB: 01-Nov-1935  Doris Mcdaniel is a 84 y.o. year old female who is a primary care patient of Colon Branch, MD. I reached out to Leeroy Bock by phone today in response to a referral sent by Ms. Graham PCP, Colon Branch, MD.   [name] was given information about Chronic Care Management services today including:  1. CCM service includes personalized support from designated clinical staff supervised by her physician, including individualized plan of care and coordination with other care providers 2. 24/7 contact phone numbers for assistance for urgent and routine care needs. 3. Service will only be billed when office clinical staff spend 20 minutes or more in a month to coordinate care. 4. Only one practitioner may furnish and bill the service in a calendar month. 5. The patient may stop CCM services at any time (effective at the end of the month) by phone call to the office staff. 6. The patient will be responsible for cost sharing (co-pay) of up to 20% of the service fee (after annual deductible is met).  Patient agreed to services and verbal consent obtained.   Follow up plan:   Raynicia Dukes [employer] Scheduler

## 2019-10-05 ENCOUNTER — Other Ambulatory Visit: Payer: Self-pay

## 2019-10-05 ENCOUNTER — Ambulatory Visit: Payer: PPO | Admitting: Pharmacist

## 2019-10-05 DIAGNOSIS — J3089 Other allergic rhinitis: Secondary | ICD-10-CM

## 2019-10-05 DIAGNOSIS — M25561 Pain in right knee: Secondary | ICD-10-CM

## 2019-10-05 DIAGNOSIS — K219 Gastro-esophageal reflux disease without esophagitis: Secondary | ICD-10-CM

## 2019-10-05 DIAGNOSIS — E785 Hyperlipidemia, unspecified: Secondary | ICD-10-CM

## 2019-10-05 DIAGNOSIS — I10 Essential (primary) hypertension: Secondary | ICD-10-CM

## 2019-10-05 NOTE — Chronic Care Management (AMB) (Signed)
Chronic Care Management Pharmacy  Name: Doris Mcdaniel  MRN: [id number] DOB: [birth year]   Chief Complaint/ HPI  Doris Mcdaniel,  84 y.o. , female presents for their Initial CCM visit with the clinical pharmacist via telephone due to COVID-19 Pandemic.  PCP : Colon Branch, MD  Their chronic conditions include: HTN, GERD, HLD, Allergic Rhinitis, Depression, Joint Pain, Skin lesion on face  Office Visits: 07/16/19: Office visit w/ Dr. Larose Kells. Annual Physical. She had a hard lesion at the left side of her face for 3-4 months then it "fell off" (referral made to derm). Labs ordered (lipid, cmp, cbc)  Consult Visit: 04/08/19: Office visit w/ Dr. Mardelle Matte  Medications: Outpatient Encounter Medications as of 10/05/2019  Medication Sig Note  . atorvastatin (LIPITOR) 20 MG tablet Take 1 tablet (20 mg total) by mouth at bedtime.   . hydrochlorothiazide (HYDRODIURIL) 25 MG tablet Take 1 tablet (25 mg total) by mouth daily.   Marland Kitchen lisinopril (ZESTRIL) 20 MG tablet Take 1 tablet (20 mg total) by mouth daily.   Marland Kitchen loratadine (CLARITIN) 10 MG tablet Take 1 tablet (10 mg total) by mouth daily.   . Multiple Vitamin (MULTIVITAMIN) capsule Take 1 capsule by mouth daily.     Marland Kitchen omeprazole (PRILOSEC) 20 MG capsule Take 1 capsule (20 mg total) by mouth daily as needed. 10/05/2019: Is taking as needed  . verapamil (CALAN-SR) 240 MG CR tablet Take 1 tablet (240 mg total) by mouth at bedtime.    No facility-administered encounter medications on file as of 10/05/2019.   Immunization History  Administered Date(s) Administered  . Fluad Quad(high Dose 65+) 07/16/2019  . Influenza Split 07/09/2011, 06/24/2012  . Influenza Whole 05/05/2008, 05/06/2009, 07/12/2010  . Influenza, High Dose Seasonal PF 06/30/2013, 07/05/2015, 07/09/2016, 07/11/2017, 07/14/2018  . Influenza,inj,Quad PF,6+ Mos 07/02/2014  . PFIZER SARS-COV-2 Vaccination 09/17/2019  . Pneumococcal Conjugate-13 07/02/2014  . Pneumococcal  Polysaccharide-23 07/13/2006, 07/14/2018  . Td 05/21/2007, 07/11/2017   Getting second COVID vaccine on Thursday, October 08, 2019.   Current Diagnosis/Assessment:  Goals Addressed            This Visit's Progress   . Blood pressure goal <140/90      . Check blood pressure 3 to 4 times per week      . Pharmacy Care Plan       Current Barriers:  . Chronic Disease Management support, education, and care coordination needs related to HTN, GERD, HLD, Allergic Rhinitis, Depression, Joint Pain, Skin lesion on face  Pharmacist Clinical Goal(s):  Marland Kitchen BP goal <140/90  Interventions: . Comprehensive medication review performed. . Check blood pressure 3 to 4 times per week  Patient Self Care Activities:  . Patient verbalizes understanding of plan to follow as described above, Self administers medications as prescribed, Calls pharmacy for medication refills, and Calls provider office for new concerns or questions  Initial goal documentation        Social Hx:  Married 54 years. 3 kids. 6 grandkids.  She is very thankful for Dr. Larose Kells  Hypertension   CMP     Component Value Date/Time   NA 140 07/16/2019 1053   K 3.9 07/16/2019 1053   CL 101 07/16/2019 1053   CO2 28 07/16/2019 1053   GLUCOSE 110 (H) 07/16/2019 1053   BUN 22 07/16/2019 1053   CREATININE 1.34 (H) 07/16/2019 1053   CALCIUM 9.1 07/16/2019 1053   PROT 6.8 07/16/2019 1053   ALBUMIN 4.3 07/16/2019 1053   AST 14  09/22/2019 1003   ALT 10 09/22/2019 1003   ALKPHOS 58 07/16/2019 1053   BILITOT 0.6 07/16/2019 1053   GFRNONAA >60 02/20/2011 0950   GFRAA >60 02/20/2011 0950  GFR 37.97 on 07/16/2019  BP today is: 145/58 pulse 51 per patient doing BP at home. Recheck: 142/65 pulse 67 (has not taken BP meds yet)  Office blood pressures are  BP Readings from Last 3 Encounters:  07/16/19 (!) 131/51  03/08/19 (!) 147/50  03/03/19 128/60    Patient has failed these meds in the past: None noted  Patient is currently  controlled on the following medications: hctz 25mg  daily HS, lisinopril 20mg  daily AM, verapamil 240mg  daily HS  Patient checks BP at home infrequently  Patient home BP readings are ranging: 120s-130s/  We discussed proper BP technique, administration time of hctz. Pt prefers to take at night. She does not mind getting up in the middle of the night to urinate. She would prefer this than having to urinate during the [place] when she has to travel.   Plan -Continue current medications  -Check blood pressure 3 to 4 times per week   Hyperlipidemia   Lipid Panel     Component Value Date/Time   CHOL 130 09/22/2019 1003   TRIG 113.0 09/22/2019 1003   HDL 49.50 09/22/2019 1003   CHOLHDL 3 09/22/2019 1003   VLDL 22.6 09/22/2019 1003   LDLCALC 58 09/22/2019 1003   LDLDIRECT 133.6 06/26/2012 0826     ASCVD 10-year risk: Can not calculate due to age  Patient has failed these meds in past: None Patient is currently controlled on the following medications: atorvastatin 20mg  once daily   We discussed:  diet and exercise extensively. Reviewed patient's labs and congratulated pt on improved lipid panel.  Plan -Continue current medications   GERD    Patient has failed these meds in past: None Patient is currently controlled on the following medications:   GERD: Every 2-3 months GERD Triggers: Unaware of what causes her GERD GERD Tx: omeprazole (works well as needed)  We discussed: foods that can trigger GERD  Plan -Continue current medications   Depression    Patient has failed these meds in past: None noted Patient is currently controlled on the following medications: None  PHQ9: 2 (1-little interest, 1-feeling down ("never hopeless"))  "I get moody, but I think it's the virus" She is sad she can't see her friends.   We discussed:  Meditation and things to occupy time (crossword puzzles, managing files in her home, etc)  Plan -Continue current lifestyle management  Joint  Pain    Patient has failed these meds in past: None noted Patient is currently controlled on the following medications: Blue emu PRN  Wearing arm brace for carpel tunnel. Does not want to do surgery. Being followed by Dr. Mardelle Matte. Was given leg brace, but she doesn't wear it now because her leg doesn't hurt.  We discussed: Continued follow up with Dr. Mardelle Matte as needed  Plan -Continue current medications   Skin Lesion on Face   She got a call from dermatologist, but never got appt. She states the place on her face is gone and has been fine ever since. No scar or anything. She used polysporin once daily for 2-3 days. She was told by derm that she didn't have to come in.   Plan -Continue surveillance of area on face or any other suspicious lesions

## 2019-10-05 NOTE — Patient Instructions (Signed)
Visit Information  Goals Addressed            This Visit's Progress   . Blood pressure goal <140/90      . Check blood pressure 3 to 4 times per week      . Pharmacy Care Plan       Current Barriers:  . Chronic Disease Management support, education, and care coordination needs related to HTN, GERD, HLD, Allergic Rhinitis, Depression, Joint Pain, Skin lesion on face  Pharmacist Clinical Goal(s):  Marland Kitchen BP goal <140/90  Interventions: . Comprehensive medication review performed. . Check blood pressure 3 to 4 times per week  Patient Self Care Activities:  . Patient verbalizes understanding of plan to follow as described above, Self administers medications as prescribed, Calls pharmacy for medication refills, and Calls provider office for new concerns or questions  Initial goal documentation        Doris Mcdaniel was given information about Chronic Care Management services today including:  1. CCM service includes personalized support from designated clinical staff supervised by her physician, including individualized plan of care and coordination with other care providers 2. 24/7 contact phone numbers for assistance for urgent and routine care needs. 3. Service will only be billed when office clinical staff spend 20 minutes or more in a month to coordinate care. 4. Only one practitioner may furnish and bill the service in a calendar month. 5. The patient may stop CCM services at any time (effective at the end of the month) by phone call to the office staff. 6. The patient will be responsible for cost sharing (co-pay) of up to 20% of the service fee (after annual deductible is met).  Patient agreed to services and verbal consent obtained.   The patient verbalized understanding of instructions provided today and agreed to receive a mailed copy of patient instruction and/or educational materials. The pharmacy team will reach out to the patient again over the next 30 days.   De Blanch, PharmD Clinical Pharmacist [facility] [phone]   Heartburn Heartburn is a type of pain or discomfort that can happen in the throat or chest. It is often described as a burning pain. It may also cause a bad, acid-like taste in the mouth. Heartburn may feel worse when you lie down or bend over. It may be worse at night. It may be caused by stomach contents that move back up (reflux) into the tube that connects the mouth with the stomach (esophagus). Follow these instructions at home: Eating and drinking   Avoid certain foods and drinks as told by your doctor. This may include: ? Coffee and tea (with or without caffeine). ? Drinks that have alcohol. ? Energy drinks and sports drinks. ? Carbonated drinks or sodas. ? Chocolate and cocoa. ? Peppermint and mint flavorings. ? Garlic and onions. ? Horseradish. ? Spicy and acidic foods, such as:  Peppers.  Chili powder and curry powder.  Vinegar.  Hot sauces and BBQ sauce. ? Citrus fruit juices and citrus fruits, such as:  Oranges.  Lemons.  Limes. ? Tomato-based foods, such as:  Red sauce and pizza with red sauce.  Chili.  Salsa. ? Fried and fatty foods, such as:  Donuts.  [place] fries and potato chips.  High-fat dressings. ? High-fat meats, such as:  Hot dogs and sausage.  Rib eye steak.  Ham and bacon. ? High-fat dairy items, such as:  Whole milk.  Butter.  Cream cheese.  Eat small  meals often. Avoid eating large meals.  Avoid drinking large amounts of liquid with your meals.  Avoid eating meals during the 2-3 hours before bedtime.  Avoid lying down right after you eat.  Do not exercise right after you eat. Lifestyle      If you are overweight, lose an amount of weight that is healthy for you. Ask your doctor about a safe weight loss goal.  Do not use any products that contain nicotine or tobacco, including cigarettes, e-cigarettes, and chewing  tobacco. These can make your symptoms worse. If you need help quitting, ask your doctor.  Wear loose clothes. Do not wear anything tight around your waist.  Raise (elevate) the head of your bed about 6 inches (15 cm) when you sleep.  Try to lower your stress. If you need help doing this, ask your doctor. General instructions  Pay attention to any changes in your symptoms.  Take over-the-counter and prescription medicines only as told by your doctor. ? Do not take aspirin, ibuprofen, or other NSAIDs unless your doctor says it is okay. ? Stop medicines only as told by your doctor.  Keep all follow-up visits as told by your doctor. This is important. Contact a doctor if:  You have new symptoms.  You lose weight and you do not know why it is happening.  You have trouble swallowing, or it hurts to swallow.  You have wheezing or a cough that keeps happening.  Your symptoms do not get better with treatment.  You have heartburn often for more than 2 weeks. Get help right away if:  You have pain in your arms, neck, jaw, teeth, or back.  You feel sweaty, dizzy, or light-headed.  You have chest pain or shortness of breath.  You throw up (vomit) and your throw up looks like blood or coffee grounds.  Your poop (stool) is bloody or black. These symptoms may represent a serious problem that is an emergency. Do not wait to see if the symptoms will go away. Get medical help right away. Call your local emergency services (911 in the [place] Do not drive yourself to the hospital. Summary  Heartburn is a type of pain that can happen in the throat or chest. It can feel like a burning pain. It may also cause a bad, acid-like taste in the mouth.  You may need to avoid certain foods and drinks to help your symptoms. Ask your doctor what foods and drinks you should avoid.  Take over-the-counter and prescription medicines only as told by your doctor. Do not take aspirin, ibuprofen, or other  NSAIDs unless your doctor told you to do so.  Contact your doctor if your symptoms do not get better or they get worse. This information is not intended to replace advice given to you by your health care provider. Make sure you discuss any questions you have with your health care provider. Document Revised: 12/30/2017 Document Reviewed: 12/30/2017 Elsevier Patient Education  [place].   Blood Pressure Record Sheet To take your blood pressure, you will need a blood pressure machine. You can buy a blood pressure machine (blood pressure monitor) at your clinic, drug store, or online. When choosing one, consider:  An automatic monitor that has an arm cuff.  A cuff that wraps snugly around your upper arm. You should be able to fit only one finger between your arm and the cuff.  A device that stores blood pressure reading results.  Do not choose a monitor that  measures your blood pressure from your wrist or finger. Follow your health care provider's instructions for how to take your blood pressure. To use this form:  Get one reading in the morning (a.m.) before you take any medicines.  Get one reading in the evening (p.m.) before supper.  Take at least 2 readings with each blood pressure check. This makes sure the results are correct. Wait 1-2 minutes between measurements.  Write down the results in the spaces on this form.  Repeat this once a week, or as told by your health care provider.  Make a follow-up appointment with your health care provider to discuss the results. Blood pressure log Date: _______________________  a.m. _____________________(1st reading) _____________________(2nd reading)  p.m. _____________________(1st reading) _____________________(2nd reading) Date: _______________________  a.m. _____________________(1st reading) _____________________(2nd reading)  p.m. _____________________(1st reading) _____________________(2nd reading) Date:  _______________________  a.m. _____________________(1st reading) _____________________(2nd reading)  p.m. _____________________(1st reading) _____________________(2nd reading) Date: _______________________  a.m. _____________________(1st reading) _____________________(2nd reading)  p.m. _____________________(1st reading) _____________________(2nd reading) Date: _______________________  a.m. _____________________(1st reading) _____________________(2nd reading)  p.m. _____________________(1st reading) _____________________(2nd reading) This information is not intended to replace advice given to you by your health care provider. Make sure you discuss any questions you have with your health care provider. Document Revised: 09/27/2017 Document Reviewed: 07/30/2017 Elsevier Patient Education  [place].   How to Take Your Blood Pressure You can take your blood pressure at home with a machine. You may need to check your blood pressure at home:  To check if you have high blood pressure (hypertension).  To check your blood pressure over time.  To make sure your blood pressure medicine is working. Supplies needed: You will need a blood pressure machine, or monitor. You can buy one at a drugstore or online. When choosing one:  Choose one with an arm cuff.  Choose one that wraps around your upper arm. Only one finger should fit between your arm and the cuff.  Do not choose one that measures your blood pressure from your wrist or finger. Your doctor can suggest a monitor. How to prepare Avoid these things for 30 minutes before checking your blood pressure:  Drinking caffeine.  Drinking alcohol.  Eating.  Smoking.  Exercising. Five minutes before checking your blood pressure:  Pee.  Sit in a dining chair. Avoid sitting in a soft couch or armchair.  Be quiet. Do not talk. How to take your blood pressure Follow the instructions that came with your machine. If you  have a digital blood pressure monitor, these may be the instructions: 1. Sit up straight. 2. Place your feet on the floor. Do not cross your ankles or legs. 3. Rest your left arm at the level of your heart. You may rest it on a table, desk, or chair. 4. Pull up your shirt sleeve. 5. Wrap the blood pressure cuff around the upper part of your left arm. The cuff should be 1 inch (2.5 cm) above your elbow. It is best to wrap the cuff around bare skin. 6. Fit the cuff snugly around your arm. You should be able to place only one finger between the cuff and your arm. 7. Put the cord inside the groove of your elbow. 8. Press the power button. 9. Sit quietly while the cuff fills with air and loses air. 10. Write down the numbers on the screen. 11. Wait 2-3 minutes and then repeat steps 1-10. What do the numbers mean? Two numbers make up your blood pressure. The first number  is called systolic pressure. The second is called diastolic pressure. An example of a blood pressure reading is "120 over 80" (or 120/80). If you are an adult and do not have a medical condition, use this guide to find out if your blood pressure is normal: Normal  First number: below 120.  Second number: below 80. Elevated  First number: 120-129.  Second number: below 80. Hypertension stage 1  First number: 130-139.  Second number: 80-89. Hypertension stage 2  First number: 140 or above.  Second number: 76 or above. Your blood pressure is above normal even if only the top or bottom number is above normal. Follow these instructions at home:  Check your blood pressure as often as your doctor tells you to.  Take your monitor to your next doctor's appointment. Your doctor will: ? Make sure you are using it correctly. ? Make sure it is working right.  Make sure you understand what your blood pressure numbers should be.  Tell your doctor if your medicines are causing side effects. Contact a doctor if:  Your blood  pressure keeps being high. Get help right away if:  Your first blood pressure number is higher than 180.  Your second blood pressure number is higher than 120. This information is not intended to replace advice given to you by your health care provider. Make sure you discuss any questions you have with your health care provider. Document Revised: 07/12/2017 Document Reviewed: 01/06/2016 Elsevier Patient Education  2020 [employer].

## 2019-11-02 ENCOUNTER — Telehealth: Payer: Self-pay | Admitting: Pharmacist

## 2019-11-02 NOTE — Chronic Care Management (AMB) (Signed)
Called to follow up on patient reported blood pressures. Left VM for call back.

## 2019-11-03 ENCOUNTER — Other Ambulatory Visit: Payer: Self-pay

## 2019-11-03 DIAGNOSIS — I10 Essential (primary) hypertension: Secondary | ICD-10-CM

## 2019-11-03 DIAGNOSIS — K219 Gastro-esophageal reflux disease without esophagitis: Secondary | ICD-10-CM

## 2019-11-03 DIAGNOSIS — E785 Hyperlipidemia, unspecified: Secondary | ICD-10-CM

## 2020-03-03 NOTE — Progress Notes (Signed)
Subjective:   Doris Mcdaniel is a 84 y.o. female who presents for Medicare Annual (Subsequent) preventive examination.  Review of Systems    Cardiac Risk Factors include: advanced age (>64men, >27 women);dyslipidemia;hypertension     Objective:    Today's Vitals   03/04/20 1118  BP: (!) 140/64  Pulse: 68  Temp: (!) 97 F (36.1 C)  TempSrc: Temporal  SpO2: 98%  Weight: 179 lb 12.8 oz (81.6 kg)  Height: 5\' 6"  (1.676 m)   Body mass index is 29.02 kg/m.  Advanced Directives 03/04/2020 03/03/2019 02/17/2018 01/17/2017  Does Patient Have a Medical Advance Directive? Yes Yes Yes Yes  Type of Paramedic of [place];Living will [place];Living will [place];Living will [place];Living will  Does patient want to make changes to medical advance directive? No - Patient declined No - Patient declined No - Patient declined -  Copy of [place] in Chart? No - copy requested Yes - validated most recent copy scanned in chart (See row information) No - copy requested No - copy requested    Current Medications (verified) Outpatient Encounter Medications as of 03/04/2020  Medication Sig  . atorvastatin (LIPITOR) 20 MG tablet Take 1 tablet (20 mg total) by mouth at bedtime.  . hydrochlorothiazide (HYDRODIURIL) 25 MG tablet Take 1 tablet (25 mg total) by mouth daily.  Marland Kitchen lisinopril (ZESTRIL) 20 MG tablet Take 1 tablet (20 mg total) by mouth daily.  Marland Kitchen loratadine (CLARITIN) 10 MG tablet Take 1 tablet (10 mg total) by mouth daily.  . Multiple Vitamin (MULTIVITAMIN) capsule Take 1 capsule by mouth daily.    Marland Kitchen omeprazole (PRILOSEC) 20 MG capsule Take 1 capsule (20 mg total) by mouth daily as needed.  . verapamil (CALAN-SR) 240 MG CR tablet Take 1 tablet (240 mg total) by mouth at bedtime.   No facility-administered encounter medications on file as of 03/04/2020.    Allergies (verified) Patient has  no known allergies.   History: Past Medical History:  Diagnosis Date  . Abnormal x-ray of spine    Mild Compression deformity of the the T8 vertebral body, age indertiminate   . Allergic rhinitis   . GERD (gastroesophageal reflux disease)   . History of breast cancer 2012   left breast  . Hyperlipidemia   . Hypertension   . Personal history of radiation therapy    Past Surgical History:  Procedure Laterality Date  . ABDOMINAL HYSTERECTOMY    . BREAST BIOPSY Left 01/24/2011  . BREAST LUMPECTOMY Left 02/21/2011  . BREAST SURGERY     lumpectomy (L)  . CATARACT EXTRACTION     B  . CHOLECYSTECTOMY  03/2008  . OOPHORECTOMY     they left a part of the ovarly   Family History  Problem Relation Age of Onset  . Heart disease Brother        CABG age ~ 44  . Colon cancer Neg Hx   . Breast cancer Neg Hx   . Stroke Neg Hx   . Hypertension Neg Hx   . Diabetes Neg Hx    Social History   Socioeconomic History  . Marital status: Married    Spouse name: Not on file  . Number of children: 2  . Years of education: Not on file  . Highest education level: Not on file  Occupational History  . Occupation: retired  Tobacco Use  . 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 Smokeless tobacco: Never Used  . Tobacco comment: used to smoke 1 PPD  Substance and Sexual Activity  . Alcohol use: No  . Drug use: No  . Sexual activity: Not Currently  Other Topics Concern  . Not on file  Social History Narrative   Lives w/ husband . 2 children live near by   Social Determinants of Health   Financial Resource Strain: Low Risk   . Difficulty of Paying Living Expenses: Not hard at all  Food Insecurity: No Food Insecurity  . Worried About Charity fundraiser in the Last Year: Never true  . Ran Out of Food in the Last Year: Never true  Transportation Needs: No Transportation Needs  . Lack of Transportation (Medical): No  . Lack of  Transportation (Non-Medical): No  Physical Activity:   . Days of Exercise per Week:   . Minutes of Exercise per Session:   Stress:   . Feeling of Stress :   Social Connections:   . Frequency of Communication with Friends and Family:   . Frequency of Social Gatherings with Friends and Family:   . Attends Religious Services:   . Active Member of Clubs or Organizations:   . Attends Archivist Meetings:   Marland Kitchen Marital Status:     Tobacco Counseling Counseling given: Not Answered Comment: used to smoke 1 PPD   Clinical Intake:     Pain : No/denies pain    Activities of Daily Living In your present state of health, do you have any difficulty performing the following activities: 03/04/2020  Hearing? N  Vision? N  Difficulty concentrating or making decisions? N  Walking or climbing stairs? N  Dressing or bathing? N  Doing errands, shopping? N  Preparing Food and eating ? N  Using the Toilet? N  In the past six months, have you accidently leaked urine? N  Do you have problems with loss of bowel control? N  Managing your Medications? N  Managing your Finances? N  Housekeeping or managing your Housekeeping? N  Some recent data might be hidden    Patient Care Team: Colon Branch, MD as PCP - General Luberta Mutter, MD as Consulting Physician (Ophthalmology) Day, Melvenia Beam, [facility] as Pharmacist (Pharmacist)  Indicate any recent Medical Services you may have received from other than [facility] providers in the past year (date may be approximate).     Assessment:   This is a routine wellness examination for Doris Mcdaniel.   Dietary issues and exercise activities discussed: Current Exercise Habits: The patient does not participate in regular exercise at present, Exercise limited by: None identified Diet (meal preparation, eat out, water intake, caffeinated beverages, dairy products, fruits and vegetables): well balanced   Goals    . Blood pressure goal <140/90    . Check blood  pressure 3 to 4 times per week    . DIET - INCREASE WATER INTAKE    . Maintain current health     "Do what I want to do such as mow and clean"    . Pharmacy Care Plan     Current Barriers:  . Chronic Disease Management support, education, and care coordination needs related to HTN, GERD, HLD, Allergic Rhinitis, Depression, Joint Pain, Skin lesion on face  Pharmacist Clinical Goal(s):  Marland Kitchen BP goal <140/90  Interventions: . Comprehensive medication review performed. . Check blood pressure 3 to 4 times per week  Patient Self Care Activities:  .  Patient verbalizes understanding of plan to follow as described above, Self administers medications as prescribed, Calls pharmacy for medication refills, and Calls provider office for new concerns or questions  Initial goal documentation       Depression Screen PHQ 2/9 Scores 03/04/2020 03/03/2019 02/17/2018 01/17/2017 07/09/2016 07/05/2015 07/02/2014  PHQ - 2 Score 0 0 1 1 0 0 0  PHQ- 9 Score - - - - - - -    Fall Risk Fall Risk  03/04/2020 03/03/2019 02/17/2018 01/17/2017 07/09/2016  Falls in the past year? 0 0 No No No  Number falls in past yr: 0 - - - -  Injury with Fall? 0 - - - -  Follow up Education provided;Falls prevention discussed - - - -   Lives w/ husband in 1 story home. Any stairs in or around the home? Yes  If so, are there any without handrails? No  Home free of loose throw rugs in walkways, pet beds, electrical cords, etc? Yes  Adequate lighting in your home to reduce risk of falls? Yes   ASSISTIVE DEVICES UTILIZED TO PREVENT FALLS:  Life alert? No  Use of a cane, walker or w/c? Yes  Grab bars in the bathroom? Yes  Shower chair or bench in shower? Yes  Elevated toilet seat or a handicapped toilet? Yes   TIMED UP AND GO:  Was the test performed? No .   Gait slow and steady without use of assistive device  Cognitive Function: Ad8 score reviewed for issues:  Issues making decisions:no  Less interest in hobbies /  activities:no  Repeats questions, stories (family complaining):no  Trouble using ordinary gadgets (microwave, computer, phone):no  Forgets the month or year: no  Mismanaging finances: no  Remembering appts:no  Daily problems with thinking and/or memory:no Ad8 score is=0   MMSE - Mini Mental State Exam 02/17/2018 01/17/2017  Orientation to time 5 5  Orientation to Place 5 5  Registration 3 3  Attention/ Calculation 5 5  Recall 2 2  Language- name 2 objects 2 2  Language- repeat 1 1  Language- follow 3 step command 3 3  Language- read & follow direction 1 1  Write a sentence 1 1  Copy design 1 0  Total score 29 28        Immunizations Immunization History  Administered Date(s) Administered  . Fluad Quad(high Dose 65+) 07/16/2019  . Influenza Split 07/09/2011, 06/24/2012  . Influenza Whole 05/05/2008, 05/06/2009, 07/12/2010  . Influenza, High Dose Seasonal PF 06/30/2013, 07/05/2015, 07/09/2016, 07/11/2017, 07/14/2018  . Influenza,inj,Quad PF,6+ Mos 07/02/2014  . PFIZER SARS-COV-2 Vaccination 09/17/2019, 10/07/2019  . Pneumococcal Conjugate-13 07/02/2014  . Pneumococcal Polysaccharide-23 07/13/2006, 07/14/2018  . Td 05/21/2007, 07/11/2017    TDAP status: Up to date Flu Vaccine status: Up to date Pneumococcal vaccine status: Up to date Covid-19 vaccine status: Completed vaccines  Qualifies for Shingles Vaccine? Yes   Zostavax completed No   Shingrix Completed?: No.    Education has been provided regarding the importance of this vaccine. Patient has been advised to call insurance company to determine out of pocket expense if they have not yet received this vaccine. Advised may also receive vaccine at local pharmacy or Health Dept. Verbalized acceptance and understanding.  Screening Tests Health Maintenance  Topic Date Due  . INFLUENZA VACCINE  03/13/2020  . TETANUS/TDAP  07/12/2027  . DEXA SCAN  Completed  . COVID-19 Vaccine  Completed  . PNA vac Low Risk Adult   Completed    Health  Maintenance  There are no preventive care reminders to display for this patient.  Colorectal cancer screening: No longer required.  Mammogram status: Ordered 07/16/19. Pt provided with contact info and advised to call to schedule appt.    Lung Cancer Screening: (Low Dose CT Chest recommended if Age 56-80 years, 30 pack-year currently smoking OR have quit w/in 15years.) does not qualify.    Additional Screening:  Vision Screening: Recommended annual ophthalmology exams for early detection of glaucoma and other disorders of the eye. Is the patient up to date with their annual eye exam?  Yes  Who is the provider or what is the name of the office in which the patient attends annual eye exams? Dr.McCuen   Dental Screening: Recommended annual dental exams for proper oral hygiene  Community Resource Referral / Chronic Care Management: CRR required this visit?  No   CCM required this visit?  No      Plan:    Continue to eat heart healthy diet (full of fruits, vegetables, whole grains, lean protein, water--limit salt, fat, and sugar intake) and increase physical activity as tolerated.  Continue doing brain stimulating activities (puzzles, reading, adult coloring books, staying active) to keep memory sharp.   Please schedule your next medicare wellness visit with me in 1 yr.   I have personally reviewed and noted the following in the patient's chart:   . Medical and social history . Use of alcohol, tobacco or illicit drugs  . Current medications and supplements . Functional ability and status . Nutritional status . Physical activity . Advanced directives . List of other physicians . Hospitalizations, surgeries, and ER visits in previous 12 months . Vitals . Screenings to include cognitive, depression, and falls . Referrals and appointments  In addition, I have reviewed and discussed with patient certain preventive protocols, quality metrics, and best  practice recommendations. A written personalized care plan for preventive services as well as general preventive health recommendations were provided to patient.     Naaman Plummer [place], [place]   03/04/2020

## 2020-03-04 ENCOUNTER — Encounter: Payer: Self-pay | Admitting: *Deleted

## 2020-03-04 ENCOUNTER — Ambulatory Visit (INDEPENDENT_AMBULATORY_CARE_PROVIDER_SITE_OTHER): Payer: PPO | Admitting: *Deleted

## 2020-03-04 ENCOUNTER — Other Ambulatory Visit: Payer: Self-pay

## 2020-03-04 VITALS — BP 140/64 | HR 68 | Temp 97.0°F | Ht 66.0 in | Wt 179.8 lb

## 2020-03-04 DIAGNOSIS — Z0000 Encounter for general adult medical examination without abnormal findings: Secondary | ICD-10-CM | POA: Diagnosis not present

## 2020-03-04 NOTE — Patient Instructions (Signed)
Continue to eat heart healthy diet (full of fruits, vegetables, whole grains, lean protein, water--limit salt, fat, and sugar intake) and increase physical activity as tolerated.  Continue doing brain stimulating activities (puzzles, reading, adult coloring books, staying active) to keep memory sharp.   Please schedule your next medicare wellness visit with me in 1 yr.   Doris Mcdaniel , Thank you for taking time to come for your Medicare Wellness Visit. I appreciate your ongoing commitment to your health goals. Please review the following plan we discussed and let me know if I can assist you in the future.   These are the goals we discussed: Goals     Blood pressure goal <140/90     Check blood pressure 3 to 4 times per week     DIET - INCREASE WATER INTAKE     Maintain current health     "Do what I want to do such as mow and clean"     Pharmacy Care Plan     Current Barriers:   Chronic Disease Management support, education, and care coordination needs related to HTN, GERD, HLD, Allergic Rhinitis, Depression, Joint Pain, Skin lesion on face  Pharmacist Clinical Goal(s):   BP goal <140/90  Interventions:  Comprehensive medication review performed.  Check blood pressure 3 to 4 times per week  Patient Self Care Activities:   Patient verbalizes understanding of plan to follow as described above, Self administers medications as prescribed, Calls pharmacy for medication refills, and Calls provider office for new concerns or questions  Initial goal documentation        This is a list of the screening recommended for you and due dates:  Health Maintenance  Topic Date Due   Flu Shot  03/13/2020   Tetanus Vaccine  07/12/2027   DEXA scan (bone density measurement)  Completed   COVID-19 Vaccine  Completed   Pneumonia vaccines  Completed    Preventive Care 71 Years and Older, Female Preventive care refers to lifestyle choices and visits with your health care provider  that can promote health and wellness. This includes:  A yearly physical exam. This is also called an annual well check.  Regular dental and eye exams.  Immunizations.  Screening for certain conditions.  Healthy lifestyle choices, such as diet and exercise. What can I expect for my preventive care visit? Physical exam Your health care provider will check:  Height and weight. These may be used to calculate body mass index (BMI), which is a measurement that tells if you are at a healthy weight.  Heart rate and blood pressure.  Your skin for abnormal spots. Counseling Your health care provider may ask you questions about:  Alcohol, tobacco, and drug use.  Emotional well-being.  Home and relationship well-being.  Sexual activity.  Eating habits.  History of falls.  Memory and ability to understand (cognition).  Work and work Statistician.  Pregnancy and menstrual history. What immunizations do I need?  Influenza (flu) vaccine  This is recommended every year. Tetanus, diphtheria, and pertussis (Tdap) vaccine  You may need a Td booster every 10 years. Varicella (chickenpox) vaccine  You may need this vaccine if you have not already been vaccinated. Zoster (shingles) vaccine  You may need this after age 82. Pneumococcal conjugate (PCV13) vaccine  One dose is recommended after age 84. Pneumococcal polysaccharide (PPSV23) vaccine  One dose is recommended after age 59. Measles, mumps, and rubella (MMR) vaccine  You may need at least one dose of MMR if  you were born in [birth year] or later. You may also need a second dose. Meningococcal conjugate (MenACWY) vaccine  You may need this if you have certain conditions. Hepatitis A vaccine  You may need this if you have certain conditions or if you travel or work in places where you may be exposed to hepatitis A. Hepatitis B vaccine  You may need this if you have certain conditions or if you travel or work in places where  you may be exposed to hepatitis B. Haemophilus influenzae type b (Hib) vaccine  You may need this if you have certain conditions. You may receive vaccines as individual doses or as more than one vaccine together in one shot (combination vaccines). Talk with your health care provider about the risks and benefits of combination vaccines. What tests do I need? Blood tests  Lipid and cholesterol levels. These may be checked every 5 years, or more frequently depending on your overall health.  Hepatitis C test.  Hepatitis B test. Screening  Lung cancer screening. You may have this screening every year starting at age 17 if you have a 30-pack-year history of smoking and currently smoke or have quit within the past 15 years.  Colorectal cancer screening. All adults should have this screening starting at age 50 and continuing until age 49. Your health care provider may recommend screening at age 75 if you are at increased risk. You will have tests every 1-10 years, depending on your results and the type of screening test.  Diabetes screening. This is done by checking your blood sugar (glucose) after you have not eaten for a while (fasting). You may have this done every 1-3 years.  Mammogram. This may be done every 1-2 years. Talk with your health care provider about how often you should have regular mammograms.  BRCA-related cancer screening. This may be done if you have a family history of breast, ovarian, tubal, or peritoneal cancers. Other tests  Sexually transmitted disease (STD) testing.  Bone density scan. This is done to screen for osteoporosis. You may have this done starting at age 82. Follow these instructions at home: Eating and drinking  Eat a diet that includes fresh fruits and vegetables, whole grains, lean protein, and low-fat dairy products. Limit your intake of foods with high amounts of sugar, saturated fats, and salt.  Take vitamin and mineral supplements as recommended by  your health care provider.  Do not drink alcohol if your health care provider tells you not to drink.  If you drink alcohol: ? Limit how much you have to 0-1 drink a day. ? Be aware of how much alcohol is in your drink. In the [place] one drink equals one 12 oz bottle of beer (355 mL), one 5 oz glass of wine (148 mL), or one 1 oz glass of hard liquor (44 mL). Lifestyle  Take daily care of your teeth and gums.  Stay active. Exercise for at least 30 minutes on 5 or more days each week.  Do not use any products that contain nicotine or tobacco, such as cigarettes, e-cigarettes, and chewing tobacco. If you need help quitting, ask your health care provider.  If you are sexually active, practice safe sex. Use a condom or other form of protection in order to prevent STIs (sexually transmitted infections).  Talk with your health care provider about taking a low-dose aspirin or statin. What's next?  Go to your health care provider once a year for a well check visit.  Ask your  health care provider how often you should have your eyes and teeth checked.  Stay up to date on all vaccines. This information is not intended to replace advice given to you by your health care provider. Make sure you discuss any questions you have with your health care provider. Document Revised: 07/24/2018 Document Reviewed: 07/24/2018 Elsevier Patient Education  2020 [employer].

## 2020-03-08 ENCOUNTER — Other Ambulatory Visit: Payer: Self-pay

## 2020-04-04 ENCOUNTER — Other Ambulatory Visit: Payer: Self-pay

## 2020-04-04 ENCOUNTER — Ambulatory Visit: Payer: PPO | Admitting: Pharmacist

## 2020-04-04 VITALS — BP 129/34 | HR 57 | Temp 97.9°F | Wt 178.4 lb

## 2020-04-04 DIAGNOSIS — I10 Essential (primary) hypertension: Secondary | ICD-10-CM

## 2020-04-04 DIAGNOSIS — E785 Hyperlipidemia, unspecified: Secondary | ICD-10-CM

## 2020-04-04 NOTE — Patient Instructions (Signed)
Visit Information  Goals Addressed            This Visit's Progress   . Chronic Care Management Pharmacy Care Plan       CARE PLAN ENTRY (see longitudinal plan of care for additional care plan information)  Current Barriers:  . Chronic Disease Management support, education, and care coordination needs related to HTN, GERD, HLD, Allergic Rhinitis, Depression, Joint Pain   Hypertension BP Readings from Last 3 Encounters:  04/04/20 (!) 129/34  03/04/20 (!) 140/64  07/16/19 (!) 131/51   . Pharmacist Clinical Goal(s): o Over the next 180 days, patient will work with PharmD and providers to maintain BP goal <140/90 . Current regimen:   Hctz 25mg  daily HS  Lisinopril 20mg  daily AM  Verapamil 240mg  daily HS . Patient self care activities - Over the next 180 days, patient will: o Maintain hypertension medication regimen.   Hyperlipidemia Lab Results  Component Value Date/Time   LDLCALC 58 09/22/2019 10:03 AM   LDLDIRECT 133.6 06/26/2012 08:26 AM   . Pharmacist Clinical Goal(s): o Over the next 90 days, patient will work with PharmD and providers to maintain LDL goal < 100 . Current regimen:  o Atorvastatin 20mg  daily . Patient self care activities - Over the next 180 days, patient will: o Maintain cholesterol medication regimen.    Medication management . Pharmacist Clinical Goal(s): o Over the next 180 days, patient will work with PharmD and providers to maintain optimal medication adherence . Current pharmacy: [facility] . Interventions o Comprehensive medication review performed. o Continue current medication management strategy . Patient self care activities - Over the next 180 days, patient will: o Focus on medication adherence by filling and taking medications appropriately  o Take medications as prescribed o Report any questions or concerns to PharmD and/or provider(s)  Please see past updates related to this goal by clicking on the "Past Updates" button in  the selected goal        The patient verbalized understanding of instructions provided today and agreed to receive a mailed copy of patient instruction and/or educational materials.  Telephone follow up appointment with pharmacy team member scheduled for: 10/06/2019  De Blanch, PharmD Clinical Pharmacist [facility] [phone]

## 2020-04-04 NOTE — Chronic Care Management (AMB) (Signed)
Chronic Care Management Pharmacy  Name: Doris Mcdaniel  MRN: [id number] DOB: 1936-01-09   Chief Complaint/ HPI  Leeroy Bock,  84 y.o. , female presents for their Follow-Up CCM visit with the clinical pharmacist In office.  PCP : Colon Branch, MD  Their chronic conditions include: HTN, GERD, HLD, Allergic Rhinitis, Depression, Joint Pain, Skin lesion on face  Office Visits: 03/04/20: Medicare Annual Wellness Exam w/ Naaman Plummer, RN  Consult Visit: None in the last 6 months.  Medications: Outpatient Encounter Medications as of 04/04/2020  Medication Sig Note  . atorvastatin (LIPITOR) 20 MG tablet Take 1 tablet (20 mg total) by mouth at bedtime.   . hydrochlorothiazide (HYDRODIURIL) 25 MG tablet Take 1 tablet (25 mg total) by mouth daily.   Marland Kitchen lisinopril (ZESTRIL) 20 MG tablet Take 1 tablet (20 mg total) by mouth daily.   Marland Kitchen loratadine (CLARITIN) 10 MG tablet Take 1 tablet (10 mg total) by mouth daily.   . Multiple Vitamin (MULTIVITAMIN) capsule Take 1 capsule by mouth daily.     Marland Kitchen omeprazole (PRILOSEC) 20 MG capsule Take 1 capsule (20 mg total) by mouth daily as needed. 10/05/2019: Is taking as needed  . verapamil (CALAN-SR) 240 MG CR tablet Take 1 tablet (240 mg total) by mouth at bedtime.    No facility-administered encounter medications on file as of 04/04/2020.   SDOH Screenings   Alcohol Screen:   . Last Alcohol Screening Score (AUDIT): Not on file  Depression (PHQ2-9): Low Risk   . PHQ-2 Score: 0  Financial Resource Strain: Low Risk   . Difficulty of Paying Living Expenses: Not hard at all  Food Insecurity: No Food Insecurity  . Worried About Charity fundraiser in the Last Year: Never true  . Ran Out of Food in the Last Year: Never true  Housing: Low Risk   . Last Housing Risk Score: 0  Physical Activity:   . Days of Exercise per Week: Not on file  . Minutes of Exercise per Session: Not on file  Social Connections:   . Frequency of Communication with  Friends and Family: Not on file  . Frequency of Social Gatherings with Friends and Family: Not on file  . Attends Religious Services: Not on file  . Active Member of Clubs or Organizations: Not on file  . Attends Archivist Meetings: Not on file  . Marital Status: Not on file  Stress:   . Feeling of Stress : Not on file  Tobacco Use: Medium Risk  . Smoking Tobacco Use: Former Smoker  . Smokeless Tobacco Use: Never Used  Transportation Needs: No Transportation Needs  . Lack of Transportation (Medical): No  . Lack of Transportation (Non-Medical): No     Current Diagnosis/Assessment:  Goals Addressed            This Visit's Progress   . Chronic Care Management Pharmacy Care Plan       CARE PLAN ENTRY (see longitudinal plan of care for additional care plan information)  Current Barriers:  . Chronic Disease Management support, education, and care coordination needs related to HTN, GERD, HLD, Allergic Rhinitis, Depression, Joint Pain   Hypertension BP Readings from Last 3 Encounters:  04/04/20 (!) 129/34  03/04/20 (!) 140/64  07/16/19 (!) 131/51   . Pharmacist Clinical Goal(s): o Over the next 180 days, patient will work with PharmD and providers to maintain BP goal <140/90 . Current regimen:   Hctz 87m daily HS  Lisinopril 230m  daily AM  Verapamil 266m daily HS . Patient self care activities - Over the next 180 days, patient will: o Maintain hypertension medication regimen.   Hyperlipidemia Lab Results  Component Value Date/Time   LDLCALC 58 09/22/2019 10:03 AM   LDLDIRECT 133.6 06/26/2012 08:26 AM   . Pharmacist Clinical Goal(s): o Over the next 90 days, patient will work with PharmD and providers to maintain LDL goal < 100 . Current regimen:  o Atorvastatin 216mdaily . Patient self care activities - Over the next 180 days, patient will: o Maintain cholesterol medication regimen.    Medication management . Pharmacist Clinical Goal(s): o Over  the next 180 days, patient will work with PharmD and providers to maintain optimal medication adherence . [facility] Interventions o Comprehensive medication review performed. o Continue current medication management strategy . Patient self care activities - Over the next 180 days, patient will: o Focus on medication adherence by filling and taking medications appropriately  o Take medications as prescribed o Report any questions or concerns to PharmD and/or provider(s)  Please see past updates related to this goal by clicking on the "Past Updates" button in the selected goal        Social Hx:  Married 54 years. 3 kids. 6 grandkids.  She is very thankful for Dr. PaLarose KellsHypertension   BP goal is:  <140/90  Office blood pressures are  BP Readings from Last 3 Encounters:  04/04/20 (!) 129/34  03/04/20 (!) 140/64  07/16/19 (!) 131/51   Patient checks BP at home infrequently Patient home BP readings are ranging: 120s-130s/60s-70s  Patient has failed these meds in the past: None noted  Patient is currently uncontrolled on the following medications:   Hctz 2554maily HS  Lisinopril 47m79mily AM  Verapamil 240mg13mly HS  Initial CCM Visit 10/05/19 We discussed proper BP technique, administration time of hctz. Pt prefers to take at night. She does not mind getting up in the middle of the night to urinate. She would prefer this than having to urinate during the [place] when she has to travel.   Update 04/04/20 Patient checks her BP occasionally and reports her BP runs in the 120-130/60-70 range.  BP at goal. Denies any symptoms.   Plan -Continue current medications    Hyperlipidemia   LDL goal <100  Lipid Panel     Component Value Date/Time   CHOL 130 09/22/2019 1003   TRIG 113.0 09/22/2019 1003   HDL 49.50 09/22/2019 1003   LDLCALC 58 09/22/2019 1003   LDLDIRECT 133.6 06/26/2012 0826    Hepatic Function Latest Ref Rng & Units 09/22/2019 07/16/2019  07/14/2018  Total Protein 6.0 - 8.3 g/dL - 6.8 6.9  Albumin 3.5 - 5.2 g/dL - 4.3 4.3  AST 0 - 37 U/L '14 13 13  ' ALT 0 - 35 U/L '10 9 12  ' Alk Phosphatase 39 - 117 U/L - 58 59  Total Bilirubin 0.2 - 1.2 mg/dL - 0.6 0.6  Bilirubin, Direct 0.0 - 0.3 mg/dL - - -     The ASCVD Risk score (GoffMikey Bussingr., et al., 2013) failed to calculate for the following reasons:   The 2013 ASCVD risk score is only valid for ages 40 to849   79tient has failed these meds in past: None noted  Patient is currently controlled on the following medications:  . Atorvastatin 47mg 26my  Initial CCM Visit 10/05/19 We discussed:  diet and exercise extensively. Reviewed patient's  labs and congratulated pt on improved lipid panel.  Update 04/04/20 Patient has remained adherent to statin therapy.  Plan -Continue current medications   Allergic Rhinitis    Patient has failed these meds in past: None noted  Patient is currently controlled on the following medications: . Loratadine 57m daily  States she sneezes throughout the year and has to take allergy medicine daily.  Feels that Loratadine does give her relief  We discussed:  the possibility of needing to change allergy medication if she starts not to get the same relief after years using the same allergy medication  Plan -Continue current medications   Vaccines   Reviewed and discussed patient's vaccination history.    Immunization History  Administered Date(s) Administered  . Fluad Quad(high Dose 65+) 07/16/2019  . Influenza Split 07/09/2011, 06/24/2012  . Influenza Whole 05/05/2008, 05/06/2009, 07/12/2010  . Influenza, High Dose Seasonal PF 06/30/2013, 07/05/2015, 07/09/2016, 07/11/2017, 07/14/2018  . Influenza,inj,Quad PF,6+ Mos 07/02/2014  . PFIZER SARS-COV-2 Vaccination 09/17/2019, 10/07/2019  . Pneumococcal Conjugate-13 07/02/2014  . Pneumococcal Polysaccharide-23 07/13/2006, 07/14/2018  . Td 05/21/2007, 07/11/2017    Plan We discussed the  Shingrix vaccine, but pt states she will decline this vaccine.  She has had Shingles twice (at age [age]and in the 158s and is hesitant about the vaccine.

## 2020-06-13 ENCOUNTER — Telehealth: Payer: Self-pay | Admitting: Pharmacist

## 2020-06-13 NOTE — Progress Notes (Addendum)
    Chronic Care Management Pharmacy Assistant   Name: Doris Mcdaniel  MRN: [id number] DOB: 1935/10/09  Reason for Encounter: Medication Review/ General adherence  Patient Questions:  1.  Have you seen any other providers since your last visit? No  2.  Any changes in your medicines or health? No  PCP : Colon Branch, MD  Allergies:  No Known Allergies  Medications: Outpatient Encounter Medications as of 06/13/2020  Medication Sig Note  . atorvastatin (LIPITOR) 20 MG tablet Take 1 tablet (20 mg total) by mouth at bedtime.   . hydrochlorothiazide (HYDRODIURIL) 25 MG tablet Take 1 tablet (25 mg total) by mouth daily.   Marland Kitchen lisinopril (ZESTRIL) 20 MG tablet Take 1 tablet (20 mg total) by mouth daily.   Marland Kitchen loratadine (CLARITIN) 10 MG tablet Take 1 tablet (10 mg total) by mouth daily.   . Multiple Vitamin (MULTIVITAMIN) capsule Take 1 capsule by mouth daily.     Marland Kitchen omeprazole (PRILOSEC) 20 MG capsule Take 1 capsule (20 mg total) by mouth daily as needed. 10/05/2019: Is taking as needed  . verapamil (CALAN-SR) 240 MG CR tablet Take 1 tablet (240 mg total) by mouth at bedtime.    No facility-administered encounter medications on file as of 06/13/2020.    Current Diagnosis: Patient Active Problem List   Diagnosis Date Noted  . PCP NOTES >>>>> 07/05/2015  . Depression 06/24/2012  . History of breast cancer   . Breast cancer, stage 0 03/08/2011  . Annual physical exam 01/12/2011  . DYSPNEA ON EXERTION 05/06/2009  . Compression fracture of spine-- DEXA (-) 2014, declined meds  11/04/2008  . Allergic rhinitis 11/03/2008  . *GERD 09/23/2007  . Joint pain 11/19/2006  . Hyperlipidemia  09/17/2006  . pcp notes---HTN 09/17/2006    Goals Addressed   None    Called and discussed patient's medication adherence, no problems at this time.  Patient denies any problems with current pharmacy.  [place] Patient denies any recent ED visits since last CCM visit. Patient denies any side effects to  recent medications.  Follow-Up:  Pharmacist Review   [place] Shannon, [facility] [place] Pharmacist Assistant [phone]  Reviewed by: De Blanch, PharmD Clinical Pharmacist [facility] at [facility] [phone]

## 2020-06-21 DIAGNOSIS — H26493 Other secondary cataract, bilateral: Secondary | ICD-10-CM | POA: Diagnosis not present

## 2020-06-21 DIAGNOSIS — D3131 Benign neoplasm of right choroid: Secondary | ICD-10-CM | POA: Diagnosis not present

## 2020-06-21 DIAGNOSIS — H524 Presbyopia: Secondary | ICD-10-CM | POA: Diagnosis not present

## 2020-06-21 DIAGNOSIS — H52203 Unspecified astigmatism, bilateral: Secondary | ICD-10-CM | POA: Diagnosis not present

## 2020-07-14 DIAGNOSIS — H26491 Other secondary cataract, right eye: Secondary | ICD-10-CM | POA: Diagnosis not present

## 2020-07-18 ENCOUNTER — Encounter: Payer: Self-pay | Admitting: Internal Medicine

## 2020-07-18 ENCOUNTER — Other Ambulatory Visit: Payer: Self-pay

## 2020-07-18 ENCOUNTER — Other Ambulatory Visit: Payer: Self-pay | Admitting: Internal Medicine

## 2020-07-18 ENCOUNTER — Ambulatory Visit (INDEPENDENT_AMBULATORY_CARE_PROVIDER_SITE_OTHER): Payer: PPO | Admitting: Internal Medicine

## 2020-07-18 VITALS — BP 132/74 | HR 62 | Temp 97.8°F | Ht 66.0 in | Wt 177.0 lb

## 2020-07-18 DIAGNOSIS — R269 Unspecified abnormalities of gait and mobility: Secondary | ICD-10-CM

## 2020-07-18 DIAGNOSIS — Z0000 Encounter for general adult medical examination without abnormal findings: Secondary | ICD-10-CM | POA: Diagnosis not present

## 2020-07-18 DIAGNOSIS — E785 Hyperlipidemia, unspecified: Secondary | ICD-10-CM

## 2020-07-18 DIAGNOSIS — I10 Essential (primary) hypertension: Secondary | ICD-10-CM | POA: Diagnosis not present

## 2020-07-18 DIAGNOSIS — Z23 Encounter for immunization: Secondary | ICD-10-CM | POA: Diagnosis not present

## 2020-07-18 LAB — COMPREHENSIVE METABOLIC PANEL
ALT: 9 U/L (ref 0–35)
AST: 14 U/L (ref 0–37)
Albumin: 4.1 g/dL (ref 3.5–5.2)
Alkaline Phosphatase: 60 U/L (ref 39–117)
BUN: 19 mg/dL (ref 6–23)
CO2: 28 mEq/L (ref 19–32)
Calcium: 8.6 mg/dL (ref 8.4–10.5)
Chloride: 103 mEq/L (ref 96–112)
Creatinine, Ser: 1.32 mg/dL — ABNORMAL HIGH (ref 0.40–1.20)
GFR: 37.75 mL/min — ABNORMAL LOW (ref >60.00)
Glucose, Bld: 102 mg/dL — ABNORMAL HIGH (ref 70–99)
Potassium: 3.5 mEq/L (ref 3.5–5.1)
Sodium: 141 mEq/L (ref 135–145)
Total Bilirubin: 0.7 mg/dL (ref 0.2–1.2)
Total Protein: 6.7 g/dL (ref 6.0–8.3)

## 2020-07-18 LAB — CBC WITH DIFFERENTIAL/PLATELET
Eosinophils Relative: 1 % (ref 0.0–5.0)
HCT: 36.3 % (ref 36.0–46.0)
Hemoglobin: 12.1 g/dL (ref 12.0–15.0)
Lymphocytes Relative: 49 % — ABNORMAL HIGH (ref 12.0–46.0)
MCHC: 33.4 g/dL (ref 30.0–36.0)
MCV: 86.6 fl (ref 78.0–100.0)
Monocytes Relative: 10 % (ref 3.0–12.0)
Neutrophils Relative %: 40 % — ABNORMAL LOW (ref 43.0–77.0)
Platelets: 205 10*3/uL (ref 150.0–400.0)
RBC: 4.19 Mil/uL (ref 3.87–5.11)
RDW: 13.4 % (ref 11.5–15.5)
WBC: 5.9 10*3/uL (ref 4.0–10.5)

## 2020-07-18 LAB — LIPID PANEL
Cholesterol: 131 mg/dL (ref 0–200)
HDL: 47.3 mg/dL (ref >39.00)
LDL Cholesterol: 59 mg/dL (ref 0–99)
NonHDL: 83.99
Total CHOL/HDL Ratio: 3
Triglycerides: 125 mg/dL (ref 0.0–149.0)
VLDL: 25 mg/dL (ref 0.0–40.0)

## 2020-07-18 NOTE — Patient Instructions (Addendum)
Check the  blood pressure once a month. BP GOAL is between 110/65 and  135/85. If it is consistently higher or lower, let me know    GO TO THE LAB : Get the blood work     [place], [place] back for a physical exam in 1 year  Fall Prevention in the Home, Adult Falls can cause injuries and can affect people from all age groups. There are many simple things that you can do to make your home safe and to help prevent falls. Ask for help when making these changes, if needed. What actions can I take to prevent falls? General instructions  Use good lighting in all rooms. Replace any light bulbs that burn out.  Turn on lights if it is dark. Use night-lights.  Place frequently used items in easy-to-reach places. Lower the shelves around your home if necessary.  Set up furniture so that there are clear paths around it. Avoid moving your furniture around.  Remove throw rugs and other tripping hazards from the floor.  Avoid walking on wet floors.  Fix any uneven floor surfaces.  Add color or contrast paint or tape to grab bars and handrails in your home. Place contrasting color strips on the first and last steps of stairways.  When you use a stepladder, make sure that it is completely opened and that the sides are firmly locked. Have someone hold the ladder while you are using it. Do not climb a closed stepladder.  Be aware of any and all pets. What can I do in the bathroom?      Keep the floor dry. Immediately clean up any water that spills onto the floor.  Remove soap buildup in the tub or shower on a regular basis.  Use non-skid mats or decals on the floor of the tub or shower.  Attach bath mats securely with double-sided, non-slip rug tape.  If you need to sit down while you are in the shower, use a plastic, non-slip stool.  Install grab bars by the toilet and in the tub and shower. Do not use towel bars as grab bars. What can I  do in the bedroom?  Make sure that a bedside light is easy to reach.  Do not use oversized bedding that drapes onto the floor.  Have a firm chair that has side arms to use for getting dressed. What can I do in the kitchen?  Clean up any spills right away.  If you need to reach for something above you, use a sturdy step stool that has a grab bar.  Keep electrical cables out of the way.  Do not use floor polish or wax that makes floors slippery. If you must use wax, make sure that it is non-skid floor wax. What can I do in the stairways?  Do not leave any items on the stairs.  Make sure that you have a light switch at the top of the stairs and the bottom of the stairs. Have them installed if you do not have them.  Make sure that there are handrails on both sides of the stairs. Fix handrails that are broken or loose. Make sure that handrails are as long as the stairways.  Install non-slip stair treads on all stairs in your home.  Avoid having throw rugs at the top or bottom of stairways, or secure the rugs with carpet tape to prevent them from moving.  Choose a Loss adjuster, chartered that does  not hide the edge of steps on the stairway.  Check any carpeting to make sure that it is firmly attached to the stairs. Fix any carpet that is loose or worn. What can I do on the outside of my home?  Use bright outdoor lighting.  Regularly repair the edges of walkways and driveways and fix any cracks.  Remove high doorway thresholds.  Trim any shrubbery on the main path into your home.  Regularly check that handrails are securely fastened and in good repair. Both sides of any steps should have handrails.  Install guardrails along the edges of any raised decks or porches.  Clear walkways of debris and clutter, including tools and rocks.  Have leaves, snow, and ice cleared regularly.  Use sand or salt on walkways during winter months.  In the garage, clean up any spills right away,  including grease or oil spills. What other actions can I take?  Wear closed-toe shoes that fit well and support your feet. Wear shoes that have rubber soles or low heels.  Use mobility aids as needed, such as canes, walkers, scooters, and crutches.  Review your medicines with your health care provider. Some medicines can cause dizziness or changes in blood pressure, which increase your risk of falling. Talk with your health care provider about other ways that you can decrease your risk of falls. This may include working with a physical therapist or trainer to improve your strength, balance, and endurance. Where to find more information  Centers for Disease Control and Prevention, [employer]: WebmailGuide.co.za  [employer] on Aging: BrainJudge.co.uk Contact a health care provider if:  You are afraid of falling at home.  You feel weak, drowsy, or dizzy at home.  You fall at home. Summary  There are many simple things that you can do to make your home safe and to help prevent falls.  Ways to make your home safe include removing tripping hazards and installing grab bars in the bathroom.  Ask for help when making these changes in your home. This information is not intended to replace advice given to you by your health care provider. Make sure you discuss any questions you have with your health care provider. Document Revised: 07/12/2017 Document Reviewed: 03/14/2017 Elsevier Patient Education  2020 [employer].

## 2020-07-18 NOTE — Progress Notes (Signed)
Subjective:    Patient ID: Doris Mcdaniel, female    DOB: March 13, 1936, 84 y.o.   MRN: [id number]  DOS:  07/18/2020 Type of visit - description: CPX In general feels well. Has noted her gait is not as steady as a couple of years ago.  No falls. Has knee pain consistent with DJD.   Review of Systems  Other than above, a 14 point review of systems is negative      Past Medical History:  Diagnosis Date  . Abnormal x-ray of spine    Mild Compression deformity of the the T8 vertebral body, age indertiminate   . Allergic rhinitis   . GERD (gastroesophageal reflux disease)   . History of breast cancer 2012   left breast  . Hyperlipidemia   . Hypertension   . Personal history of radiation therapy     Past Surgical History:  Procedure Laterality Date  . ABDOMINAL HYSTERECTOMY    . BREAST BIOPSY Left 01/24/2011  . BREAST LUMPECTOMY Left 02/21/2011  . BREAST SURGERY     lumpectomy (L)  . CATARACT EXTRACTION     B  . CHOLECYSTECTOMY  03/2008  . OOPHORECTOMY     they left a part of the ovarly    Allergies as of 07/18/2020   No Known Allergies     Medication List       Accurate as of July 18, 2020 11:59 PM. If you have any questions, ask your nurse or doctor.        atorvastatin 20 MG tablet Commonly known as: LIPITOR Take 1 tablet (20 mg total) by mouth at bedtime.   hydrochlorothiazide 25 MG tablet Commonly known as: HYDRODIURIL Take 1 tablet (25 mg total) by mouth daily.   lisinopril 20 MG tablet Commonly known as: ZESTRIL Take 1 tablet (20 mg total) by mouth daily.   loratadine 10 MG tablet Commonly known as: CLARITIN Take 1 tablet (10 mg total) by mouth daily.   multivitamin capsule Take 1 capsule by mouth daily.   omeprazole 20 MG capsule Commonly known as: PRILOSEC Take 1 capsule (20 mg total) by mouth daily as needed.   verapamil 240 MG CR tablet Commonly known as: CALAN-SR Take 1 tablet (240 mg total) by mouth at bedtime.            Objective:   Physical Exam BP 132/74 (BP Location: Left Arm, Patient Position: Sitting, Cuff Size: Large)   Pulse 62   Temp 97.8 F (36.6 C) (Oral)   Ht 5\' 6"  (1.676 m)   Wt 177 lb (80.3 kg)   SpO2 98%   BMI 28.57 kg/m  General: Well developed, NAD, BMI noted Neck: No  thyromegaly  HEENT:  Normocephalic . Face symmetric, atraumatic Lungs:  CTA B Normal respiratory effort, no intercostal retractions, no accessory muscle use. Heart: RRR,  no murmur.  Breast exam: Breasts are symmetric, no dominant mass, well-healed surgical scar, nipples normal, no axillary lymphadenopathy Abdomen:  Not distended, soft, non-tender. No rebound or rigidity.   Lower extremities: no pretibial edema bilaterally  Skin: Exposed areas without rash. Not pale. Not jaundice Neurologic:  alert & oriented X3.  Speech normal, gait appropriate for age and unassisted Strength symmetric and appropriate for age.  Psych: Cognition and judgment appear intact.  Cooperative with normal attention span and concentration.  Behavior appropriate. No anxious or depressed appearing.     Assessment    Assessment  HTN Hyperlipidemia GERD DJD  Breast cancer, 2012, left, lumpectomy, XRT.  Does not see oncology regularly  MSK: --T8  mild compression per x-ray, declined Rx  --DEXA 2014 and  2018: Normal. Vitamin D normal when checked  PLAN  Here for CPX HTN: on HCTZ, Zestril, calan.  Will refill benefits once labs are available Hyperlipidemia: On Lipitor, refill w/  labs DJD: See gait d/o  Gait disorder: Does not feel as steady as a couple years ago, she actually is not exercising much since the Covid pandemia started. Issue is likely multifactorial including deconditioning the and she is also limited by knee DJD. Offered PT, declined. Encouraged the use of a cane and continue toto be active by  taking preexercise Tylenol and using knee sleeves. RTC 1 year   In addition to CPX, we addressed other issues  including HTN, high cholesterol, DJD and gait disorder.  This visit occurred during the SARS-CoV-2 public health emergency.  Safety protocols were in place, including screening questions prior to the visit, additional usage of staff PPE, and extensive cleaning of exam room while observing appropriate contact time as indicated for disinfecting solutions.

## 2020-07-19 ENCOUNTER — Encounter: Payer: Self-pay | Admitting: Internal Medicine

## 2020-07-19 DIAGNOSIS — R269 Unspecified abnormalities of gait and mobility: Secondary | ICD-10-CM | POA: Insufficient documentation

## 2020-07-19 NOTE — Telephone Encounter (Signed)
Received several BP med refills- noticed her creatinine was off yesterday. Okay to refill these?

## 2020-07-19 NOTE — Assessment & Plan Note (Signed)
-  Td 06-2017  - pneumonia shot:2007and 07/2018; prevnar:2015  - strongly declined shingrix - covid vax x 3 - flu shot today -No furthert PAPs, see previous entries  -H/o breast ca, declines a mammogram due to Covid.  Clinical & self breast exam normal. -CCS: last colonoscopy 06/2009, negative.   No further screening. -Labs: CMP, FLP, CBC -Diet and exercise discussed  -Fall prevention discussed - Advance care planning: Has a living will, advised to provide [place] a copy

## 2020-07-19 NOTE — Telephone Encounter (Signed)
Okay Rx for 1 year

## 2020-07-19 NOTE — Telephone Encounter (Signed)
Patient states she needs her medication refilled .   Please Advise

## 2020-07-19 NOTE — Assessment & Plan Note (Signed)
Here for CPX HTN: on HCTZ, Zestril, calan.  Will refill benefits once labs are available Hyperlipidemia: On Lipitor, refill w/  labs DJD: See gait d/o  Gait disorder: Does not feel as steady as a couple years ago, she actually is not exercising much since the Covid pandemia started. Issue is likely multifactorial including deconditioning the and she is also limited by knee DJD. Offered PT, declined. Encouraged the use of a cane and continue toto be active by  taking preexercise Tylenol and using knee sleeves. RTC 1 year

## 2020-09-08 ENCOUNTER — Telehealth: Payer: Self-pay | Admitting: Pharmacist

## 2020-09-08 NOTE — Progress Notes (Addendum)
    Chronic Care Management Pharmacy Assistant   Name: Doris Mcdaniel  MRN: [id number] DOB: 02/26/1936  Reason for Encounter:General Disease State Call  Patient Questions:  1.  Have you seen any other providers since your last visit? Yes.   2.  Any changes in your medicines or health? No.    PCP : Colon Branch, MD   Their chronic conditions include: HTN, GERD, HLD, Allergic Rhinitis, Depression, Joint Pain, Skin lesion on face  Office Visits: 07/18/20 Dr. Larose Kells Annual physical exam.No medication changes.  Consults: 07/14/20 Ophthalmology Luberta Mutter Cataract right eye. No information given. 06/21/20 Ophthalmology Ellie Lunch, Christine Cataract left eye. No information given.  Allergies:  No Known Allergies  Medications: Outpatient Encounter Medications as of 09/08/2020  Medication Sig   atorvastatin (LIPITOR) 20 MG tablet Take 1 tablet (20 mg total) by mouth at bedtime.   hydrochlorothiazide (HYDRODIURIL) 25 MG tablet Take 1 tablet (25 mg total) by mouth daily.   lisinopril (ZESTRIL) 20 MG tablet Take 1 tablet (20 mg total) by mouth daily.   loratadine (CLARITIN) 10 MG tablet Take 1 tablet (10 mg total) by mouth daily.   Multiple Vitamin (MULTIVITAMIN) capsule Take 1 capsule by mouth daily.     omeprazole (PRILOSEC) 20 MG capsule Take 1 capsule (20 mg total) by mouth daily as needed.   verapamil (CALAN-SR) 240 MG CR tablet Take 1 tablet (240 mg total) by mouth at bedtime.   No facility-administered encounter medications on file as of 09/08/2020.    Current Diagnosis: Patient Active Problem List   Diagnosis Date Noted   Gait disorder 07/19/2020   PCP NOTES >>>>> 07/05/2015   Depression 06/24/2012   History of breast cancer    Breast cancer, stage 0 03/08/2011   Annual physical exam 01/12/2011   DYSPNEA ON EXERTION 05/06/2009   Compression fracture of spine-- DEXA (-) 2014, declined meds  11/04/2008   Allergic rhinitis 11/03/2008   *GERD 09/23/2007   Joint pain  11/19/2006   Hyperlipidemia  09/17/2006   pcp notes---HTN 09/17/2006    Goals Addressed   None    Patients stated she eats about two meals a day and snacks in between time, she stated is not a breakfast eater. Patient stated she doesn't eat out since [place]. 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 She stated she doesn't have any questions about her medication at this time. We did speak about [facility] and she liked the idea of someone hand delivering her medications to her. She was reminded of her appointment with the pharmacist on 2/23 at 10 AM face to face.   Follow-Up:  Pharmacist Review   Charlann Lange, RMA Clinical Pharmacist Assistant [phone]   5 minutes spent in review, coordination, and documentation.  Reviewed by: Beverly Milch, PharmD Clinical Pharmacist [facility] [phone]

## 2020-09-12 ENCOUNTER — Telehealth: Payer: Self-pay | Admitting: Pharmacist

## 2020-09-12 NOTE — Progress Notes (Signed)
Verified Adherence Gap Information. Per insurance data, the patient is compliant with her Lisinopril 20 mg tab last filled 04-26-2020 for a 90 day supply. Patient is compliant with Atorvastatin 20 mg tab last fill date 04-06-2020 for a 90 day supply.  Fanny Skates, [place] Pharmacist Assistant [phone]

## 2020-09-28 NOTE — Progress Notes (Signed)
Chronic Care Management Pharmacy Note  10/05/2020 Name:  Doris Mcdaniel MRN:  [id number] DOB:  1936-01-01  Subjective: Doris Mcdaniel is an 85 y.o. year old female who is a primary patient of Paz, Alda Berthold, MD.  The CCM team was consulted for assistance with disease management and care coordination needs.    Engaged with patient by telephone for follow up visit in response to provider referral for pharmacy case management and/or care coordination services.   Consent to Services:  The patient was given the following information about Chronic Care Management services today, agreed to services, and gave verbal consent: 1. CCM service includes personalized support from designated clinical staff supervised by the primary care provider, including individualized plan of care and coordination with other care providers 2. 24/7 contact phone numbers for assistance for urgent and routine care needs. 3. Service will only be billed when office clinical staff spend 20 minutes or more in a month to coordinate care. 4. Only one practitioner may furnish and bill the service in a calendar month. 5.The patient may stop CCM services at any time (effective at the end of the month) by phone call to the office staff. 6. The patient will be responsible for cost sharing (co-pay) of up to 20% of the service fee (after annual deductible is met). Patient agreed to services and consent obtained.  Patient Care Team: Colon Branch, MD as PCP - General Luberta Mutter, MD as Consulting Physician (Ophthalmology) Edythe Clarity, [facility] (Pharmacist)  Recent office visits: 07/18/20 Larose Kells) - complete physical no med changes, lab work up ordered.  All acceptable.  Recent consult visits: None recent  Hospital visits: None in previous 6 months  Objective:  Lab Results  Component Value Date   CREATININE 1.32 (H) 07/18/2020   BUN 19 07/18/2020   GFR 37.75 (L) 07/18/2020   GFRNONAA >60 02/20/2011   GFRAA >60  02/20/2011   NA 141 07/18/2020   K 3.5 07/18/2020   CALCIUM 8.6 07/18/2020   CO2 28 07/18/2020    Lab Results  Component Value Date/Time   HGBA1C 5.6 07/14/2018 11:05 AM   HGBA1C 5.6 07/05/2015 10:43 AM   GFR 37.75 (L) 07/18/2020 10:39 AM   GFR 37.97 (L) 07/16/2019 10:53 AM    Last diabetic Eye exam: No results found for: HMDIABEYEEXA  Last diabetic Foot exam: No results found for: HMDIABFOOTEX   Lab Results  Component Value Date   CHOL 131 07/18/2020   HDL 47.30 07/18/2020   LDLCALC 59 07/18/2020   LDLDIRECT 133.6 06/26/2012   TRIG 125.0 07/18/2020   CHOLHDL 3 07/18/2020    Hepatic Function Latest Ref Rng & Units 07/18/2020 09/22/2019 07/16/2019  Total Protein 6.0 - 8.3 g/dL 6.7 - 6.8  Albumin 3.5 - 5.2 g/dL 4.1 - 4.3  AST 0 - 37 U/L '14 14 13  ' ALT 0 - 35 U/L '9 10 9  ' Alk Phosphatase 39 - 117 U/L 60 - 58  Total Bilirubin 0.2 - 1.2 mg/dL 0.7 - 0.6  Bilirubin, Direct 0.0 - 0.3 mg/dL - - -    Lab Results  Component Value Date/Time   TSH 1.40 07/14/2018 11:05 AM   TSH 1.18 07/09/2016 10:51 AM    CBC Latest Ref Rng & Units 07/18/2020 07/16/2019 07/14/2018  WBC 4.0 - 10.5 K/uL 5.9 7.9 7.4  Hemoglobin 12.0 - 15.0 g/dL 12.1 12.5 12.6  Hematocrit 36.0 - 46.0 % 36.3 38.9 38.2  Platelets 150.0 - 400.0 K/uL 205.0 288.0 291.0  Lab Results  Component Value Date/Time   VD25OH 45 07/12/2010 08:19 PM    Clinical ASCVD: No  The ASCVD Risk score Mikey Bussing DC Jr., et al., 2013) failed to calculate for the following reasons:   The 2013 ASCVD risk score is only valid for ages 36 to 31    Depression screen PHQ 2/9 03/04/2020 03/03/2019 02/17/2018  Decreased Interest 0 0 0  Down, Depressed, Hopeless 0 0 1  PHQ - 2 Score 0 0 1  Altered sleeping - - -  Tired, decreased energy - - -  Change in appetite - - -  Trouble concentrating - - -  Moving slowly or fidgety/restless - - -  Suicidal thoughts - - -  PHQ-9 Score - - -      Social History   Tobacco Use  Smoking Status Former  Smoker  . Quit date: 07/08/1973  . Years since quitting: 47.2  Smokeless Tobacco Never Used  Tobacco Comment   used to smoke 1 PPD   BP Readings from Last 3 Encounters:  07/18/20 132/74  04/04/20 (!) 129/34  03/04/20 (!) 140/64   Pulse Readings from Last 3 Encounters:  07/18/20 62  04/04/20 (!) 57  03/04/20 68   Wt Readings from Last 3 Encounters:  07/18/20 177 lb (80.3 kg)  04/04/20 178 lb 6.4 oz (80.9 kg)  03/04/20 179 lb 12.8 oz (81.6 kg)    Assessment/Interventions: Review of patient past medical history, allergies, medications, health status, including review of consultants reports, laboratory and other test data, was performed as part of comprehensive evaluation and provision of chronic care management services.   SDOH:  (Social Determinants of Health) assessments and interventions performed: No   CCM Care Plan  No Known Allergies  Medications Reviewed Today    Reviewed by Edythe Clarity, [facility] (Pharmacist) on 10/05/20 at 1107  Med List Status: <None>  Medication Order Taking? Sig Documenting Provider Last Dose Status Informant  atorvastatin (LIPITOR) 20 MG tablet [id number] Yes Take 1 tablet (20 mg total) by mouth at bedtime. Colon Branch, MD Taking Active   hydrochlorothiazide (HYDRODIURIL) 25 MG tablet [id number] Yes Take 1 tablet (25 mg total) by mouth daily. Colon Branch, MD Taking Active   lisinopril (ZESTRIL) 20 MG tablet [id number] Yes Take 1 tablet (20 mg total) by mouth daily. Colon Branch, MD Taking Active   loratadine (CLARITIN) 10 MG tablet [id number] Yes Take 1 tablet (10 mg total) by mouth daily. Colon Branch, MD Taking Active   Multiple Vitamin (MULTIVITAMIN) capsule [id number] Yes Take 1 capsule by mouth daily. [provider] Taking Active   omeprazole (PRILOSEC) 20 MG capsule [id number] Yes Take 1 capsule (20 mg total) by mouth daily as needed. Colon Branch, MD Taking Active   verapamil (CALAN-SR) 240 MG CR tablet [id number] Yes Take 1 tablet (240 mg  total) by mouth at bedtime. Colon Branch, MD Taking Active           Patient Active Problem List   Diagnosis Date Noted  . Gait disorder 07/19/2020  . PCP NOTES >>>>> 07/05/2015  . Depression 06/24/2012  . History of breast cancer   . Breast cancer, stage 0 03/08/2011  . Annual physical exam 01/12/2011  . DYSPNEA ON EXERTION 05/06/2009  . Compression fracture of spine-- DEXA (-) 2014, declined meds  11/04/2008  . Allergic rhinitis 11/03/2008  . *GERD 09/23/2007  . Joint pain 11/19/2006  . Hyperlipidemia  09/17/2006  . pcp notes---HTN 09/17/2006  Immunization History  Administered Date(s) Administered  . Fluad Quad(high Dose 65+) 07/16/2019, 07/18/2020  . Influenza Split 07/09/2011, 06/24/2012  . Influenza Whole 05/05/2008, 05/06/2009, 07/12/2010  . Influenza, High Dose Seasonal PF 06/30/2013, 07/05/2015, 07/09/2016, 07/11/2017, 07/14/2018  . Influenza,inj,Quad PF,6+ Mos 07/02/2014  . PFIZER(Purple Top)SARS-COV-2 Vaccination 09/17/2019, 10/07/2019, 06/25/2020  . Pneumococcal Conjugate-13 07/02/2014  . Pneumococcal Polysaccharide-23 07/13/2006, 07/14/2018  . Td 05/21/2007, 07/11/2017    Conditions to be addressed/monitored:  HTN, GERD, HLD, Allergic Rhinitis, Depression, Joint Pain, Skin lesion on face  Care Plan : General Pharmacy (Adult)  Updates made by Edythe Clarity, RPH since 10/05/2020 12:00 AM    Problem: HTN, GERD, HLD, Allergic Rhinitis, Depression, Joint Pain, Skin lesion on face   Priority: High  Onset Date: 10/05/2020    Long-Range Goal: Patient-Specific Goal   Start Date: 10/05/2020  Expected End Date: 04/04/2021  This Visit's Progress: On track  Priority: High  Note:   Current Barriers:  . No specific barriers at this visit  Pharmacist Clinical Goal(s):  Marland Kitchen Over the next 180 days, patient will achieve adherence to monitoring guidelines and medication adherence to achieve therapeutic efficacy . maintain control of blood pressure as evidenced by  home monitoring  . contact provider office for questions/concerns as evidenced notation of same in electronic health record through collaboration with PharmD and provider.    Interventions: . 1:1 collaboration with Colon Branch, MD regarding development and update of comprehensive plan of care as evidenced by provider attestation and co-signature . Inter-disciplinary care team collaboration (see longitudinal plan of care) . Comprehensive medication review performed; medication list updated in electronic medical record  Hypertension (BP goal <140/90) -controlled -Current treatment:  HCTZ 58m daily HS  Lisinopril 236mdaily AM  Verapamil 24037maily HS -Medications previously tried: none noted  -Current home readings: "normal" -Current dietary habits: eats what she likes -Current exercise habits: very active with yardwork and house chores.  Used to walk the neighborhood but has not done as much since the person she used to walk with passed away -Denies hypotensive/hypertensive symptoms -Educated on BP goals and benefits of medications for prevention of heart attack, stroke and kidney damage; Symptoms of hypotension and importance of maintaining adequate hydration; -Counseled to monitor BP at home periodically, document, and provide log at future appointments -Recommended to continue current medication  -Recommend resume walking as weather starts to get better, patient [facility]: (LDL goal < 100) -controlled -Current treatment:  Atorvastatin 42m59mily -Medications previously tried: none noted  -Current dietary patterns: eats what she likes -Current exercise habits: see above -Educated on Cholesterol goals;  Benefits of statin for ASCVD risk reduction; Importance of limiting foods high in cholesterol; -Reviewed most recent lipid panel, congratulated her for good results  -Recommend continue current  medications  Allergic rhinitis (Goal: minimize symptoms) -controlled -Current treatment   Loratadine 10mg34mly -Medications previously tried: none noted -States she has been on this for years, still reports sneezing year round  -She reports that she feels it gives relief, mentioned switching and she declined -Recommended to continue current medication   Patient Goals/Self-Care Activities . Over the next 180 days, patient will:  - take medications as prescribed check blood pressure periodically, document, and provide at future appointments target a minimum of 150 minutes of moderate intensity exercise weekly  Follow Up Plan: The care management team will reach out to the patient again over the next 180  days.        Medication Assistance: None required.  Patient affirms current coverage meets needs.  Patient's preferred pharmacy is:  [place], [place] - [address] [address] Phone: [phone] Fax: [phone]    Pt endorses 100% compliance  We discussed: Benefits of medication synchronization, packaging and delivery as well as enhanced pharmacist oversight with [employer]. Patient decided to: Continue current medication management strategy  Care Plan and Follow Up Patient Decision:  Patient agrees to Care Plan and Follow-up.  Plan: The care management team will reach out to the patient again over the next 180 days.  Beverly Milch, PharmD Clinical Pharmacist [place] [phone]

## 2020-09-28 NOTE — Patient Instructions (Addendum)
Visit Information  Goals Addressed            This Visit's Progress   . Manage My Medicine       Timeframe:  Long-Range Goal Priority:  High Start Date: 10/05/20                            Expected End Date: 04/04/21                      Follow Up Date 01/10/21   - call for medicine refill 2 or 3 days before it runs out - keep a list of all the medicines I take; vitamins and herbals too - use a pillbox to sort medicine    Why is this important?   . These steps will help you keep on track with your medicines.   Notes: Continue to take medications as prescribed!      Patient Care Plan: General Pharmacy (Adult)    Problem Identified: HTN, GERD, HLD, Allergic Rhinitis, Depression, Joint Pain, Skin lesion on face   Priority: High  Onset Date: 10/05/2020    Long-Range Goal: Patient-Specific Goal   Start Date: 10/05/2020  Expected End Date: 04/04/2021  This Visit's Progress: On track  Priority: High  Note:   Current Barriers:  . No specific barriers at this visit  Pharmacist Clinical Goal(s):  Marland Kitchen Over the next 180 days, patient will achieve adherence to monitoring guidelines and medication adherence to achieve therapeutic efficacy . maintain control of blood pressure as evidenced by home monitoring  . contact provider office for questions/concerns as evidenced notation of same in electronic health record through collaboration with PharmD and provider.    Interventions: . 1:1 collaboration with Colon Branch, MD regarding development and update of comprehensive plan of care as evidenced by provider attestation and co-signature . Inter-disciplinary care team collaboration (see longitudinal plan of care) . Comprehensive medication review performed; medication list updated in electronic medical record  Hypertension (BP goal <140/90) -controlled -Current treatment:  HCTZ 25mg  daily HS  Lisinopril 20mg  daily AM  Verapamil 240mg  daily HS -Medications previously tried: none  noted  -Current home readings: "normal" -Current dietary habits: eats what she likes -Current exercise habits: very active with yardwork and house chores.  Used to walk the neighborhood but has not done as much since the person she used to walk with passed away -Denies hypotensive/hypertensive symptoms -Educated on BP goals and benefits of medications for prevention of heart attack, stroke and kidney damage; Symptoms of hypotension and importance of maintaining adequate hydration; -Counseled to monitor BP at home periodically, document, and provide log at future appointments -Recommended to continue current medication  -Recommend resume walking as weather starts to get better, patient mentions she and her husband may start back going [facility] the [facility]  Hyperlipidemia: (LDL goal < 100) -controlled -Current treatment:  Atorvastatin 20mg  daily -Medications previously tried: none noted  -Current dietary patterns: eats what she likes -Current exercise habits: see above -Educated on Cholesterol goals;  Benefits of statin for ASCVD risk reduction; Importance of limiting foods high in cholesterol; -Reviewed most recent lipid panel, congratulated her for good results  -Recommend continue current medications  Allergic rhinitis (Goal: minimize symptoms) -controlled -Current treatment   Loratadine 10mg  daily -Medications previously tried: none noted -States she has been on this for years, still reports sneezing year round  -She reports that she feels it gives relief, mentioned switching and  she declined -Recommended to continue current medication   Patient Goals/Self-Care Activities . Over the next 180 days, patient will:  - take medications as prescribed check blood pressure periodically, document, and provide at future appointments target a minimum of 150 minutes of moderate intensity exercise weekly  Follow Up Plan: The care management team will reach out to the patient again over the next  180 days.       The patient verbalized understanding of instructions, educational materials, and care plan provided today and agreed to receive a mailed copy of patient instructions, educational materials, and care plan.   Telephone follow up appointment with pharmacy team member scheduled for: 6 months  Edythe Clarity, [facility]  Managing Your Hypertension Hypertension, also called high blood pressure, is when the force of the blood pressing against the walls of the arteries is too strong. Arteries are blood vessels that carry blood from your heart throughout your body. Hypertension forces the heart to work harder to pump blood and may cause the arteries to become narrow or stiff. Understanding blood pressure readings Your personal target blood pressure may vary depending on your medical conditions, your age, and other factors. A blood pressure reading includes a higher number over a lower number. Ideally, your blood pressure should be below 120/80. You should know that:  The first, or top, number is called the systolic pressure. It is a measure of the pressure in your arteries as your heart beats.  The second, or bottom number, is called the diastolic pressure. It is a measure of the pressure in your arteries as the heart relaxes. Blood pressure is classified into four stages. Based on your blood pressure reading, your health care provider may use the following stages to determine what type of treatment you need, if any. Systolic pressure and diastolic pressure are measured in a unit called mmHg. Normal  Systolic pressure: below 235.  Diastolic pressure: below 80. Elevated  Systolic pressure: 573-220.  Diastolic pressure: below 80. Hypertension stage 1  Systolic pressure: 254-270.  Diastolic pressure: 62-37. Hypertension stage 2  Systolic pressure: 628 or above.  Diastolic pressure: 90 or above. How can this condition affect me? Managing your hypertension is an important  responsibility. Over time, hypertension can damage the arteries and decrease blood flow to important parts of the body, including the brain, heart, and kidneys. Having untreated or uncontrolled hypertension can lead to:  A heart attack.  A stroke.  A weakened blood vessel (aneurysm).  Heart failure.  Kidney damage.  Eye damage.  Metabolic syndrome.  Memory and concentration problems.  Vascular dementia. What actions can I take to manage this condition? Hypertension can be managed by making lifestyle changes and possibly by taking medicines. Your health care provider will help you make a plan to bring your blood pressure within a normal range. Nutrition  Eat a diet that is high in fiber and potassium, and low in salt (sodium), added sugar, and fat. An example eating plan is called the Dietary Approaches to Stop Hypertension (DASH) diet. To eat this way: ? Eat plenty of fresh fruits and vegetables. Try to fill one-half of your plate at each meal with fruits and vegetables. ? Eat whole grains, such as whole-wheat pasta, brown rice, or whole-grain bread. Fill about one-fourth of your plate with whole grains. ? Eat low-fat dairy products. ? Avoid fatty cuts of meat, processed or cured meats, and poultry with skin. Fill about one-fourth of your plate with lean proteins such as fish,  chicken without skin, beans, eggs, and tofu. ? Avoid pre-made and processed foods. These tend to be higher in sodium, added sugar, and fat.  Reduce your daily sodium intake. Most people with hypertension should eat less than 1,500 mg of sodium a day.   Lifestyle  Work with your health care provider to maintain a healthy body weight or to lose weight. Ask what an ideal weight is for you.  Get at least 30 minutes of exercise that causes your heart to beat faster (aerobic exercise) most days of the week. Activities may include walking, swimming, or biking.  Include exercise to strengthen your muscles  (resistance exercise), such as weight lifting, as part of your weekly exercise routine. Try to do these types of exercises for 30 minutes at least 3 days a week.  Do not use any products that contain nicotine or tobacco, such as cigarettes, e-cigarettes, and chewing tobacco. If you need help quitting, ask your health care provider.  Control any long-term (chronic) conditions you have, such as high cholesterol or diabetes.  Identify your sources of stress and find ways to manage stress. This may include meditation, deep breathing, or making time for fun activities.   Alcohol use  Do not drink alcohol if: ? Your health care provider tells you not to drink. ? You are pregnant, may be pregnant, or are planning to become pregnant.  If you drink alcohol: ? Limit how much you use to:  0-1 drink a day for women.  0-2 drinks a day for men. ? Be aware of how much alcohol is in your drink. In the [place] one drink equals one 12 oz bottle of beer (355 mL), one 5 oz glass of wine (148 mL), or one 1 oz glass of hard liquor (44 mL). Medicines Your health care provider may prescribe medicine if lifestyle changes are not enough to get your blood pressure under control and if:  Your systolic blood pressure is 130 or higher.  Your diastolic blood pressure is 80 or higher. Take medicines only as told by your health care provider. Follow the directions carefully. Blood pressure medicines must be taken as told by your health care provider. The medicine does not work as well when you skip doses. Skipping doses also puts you at risk for problems. Monitoring Before you monitor your blood pressure:  Do not smoke, drink caffeinated beverages, or exercise within 30 minutes before taking a measurement.  Use the bathroom and empty your bladder (urinate).  Sit quietly for at least 5 minutes before taking measurements. Monitor your blood pressure at home as told by your health care provider. To do this:  Sit  with your back straight and supported.  Place your feet flat on the floor. Do not cross your legs.  Support your arm on a flat surface, such as a table. Make sure your upper arm is at heart level.  Each time you measure, take two or three readings one minute apart and record the results. You may also need to have your blood pressure checked regularly by your health care provider.   General information  Talk with your health care provider about your diet, exercise habits, and other lifestyle factors that may be contributing to hypertension.  Review all the medicines you take with your health care provider because there may be side effects or interactions.  Keep all visits as told by your health care provider. Your health care provider can help you create and adjust your plan for managing  your high blood pressure. Where to find more information  National Heart, Lung, and Blood Institute: https://wilson-eaton.com/  American Heart Association: www.heart.org Contact a health care provider if:  You think you are having a reaction to medicines you have taken.  You have repeated (recurrent) headaches.  You feel dizzy.  You have swelling in your ankles.  You have trouble with your vision. Get help right away if:  You develop a severe headache or confusion.  You have unusual weakness or numbness, or you feel faint.  You have severe pain in your chest or abdomen.  You vomit repeatedly.  You have trouble breathing. These symptoms may represent a serious problem that is an emergency. Do not wait to see if the symptoms will go away. Get medical help right away. Call your local emergency services (911 in the [place] Do not drive yourself to the hospital. Summary  Hypertension is when the force of blood pumping through your arteries is too strong. If this condition is not controlled, it may put you at risk for serious complications.  Your personal target blood pressure may vary depending on  your medical conditions, your age, and other factors. For most people, a normal blood pressure is less than 120/80.  Hypertension is managed by lifestyle changes, medicines, or both.  Lifestyle changes to help manage hypertension include losing weight, eating a healthy, low-sodium diet, exercising more, stopping smoking, and limiting alcohol. This information is not intended to replace advice given to you by your health care provider. Make sure you discuss any questions you have with your health care provider. Document Revised: 09/04/2019 Document Reviewed: 06/30/2019 Elsevier Patient Education  2021 [employer].

## 2020-10-05 ENCOUNTER — Ambulatory Visit (INDEPENDENT_AMBULATORY_CARE_PROVIDER_SITE_OTHER): Payer: PPO

## 2020-10-05 DIAGNOSIS — E785 Hyperlipidemia, unspecified: Secondary | ICD-10-CM | POA: Diagnosis not present

## 2020-10-05 DIAGNOSIS — I10 Essential (primary) hypertension: Secondary | ICD-10-CM

## 2020-10-05 DIAGNOSIS — J3089 Other allergic rhinitis: Secondary | ICD-10-CM

## 2020-10-27 ENCOUNTER — Other Ambulatory Visit: Payer: Self-pay | Admitting: Internal Medicine

## 2020-12-12 ENCOUNTER — Telehealth: Payer: Self-pay | Admitting: Pharmacist

## 2020-12-12 NOTE — Chronic Care Management (AMB) (Signed)
    Chronic Care Management Pharmacy Assistant   Name: Doris Mcdaniel  MRN: [id number] DOB: [birth year]   Reason for Encounter: Disease State-General    Recent office visits:  07/18/20- Kathlene November, MD(PCP)  Recent consult visits:  07/14/20- Luberta Mutter (Ophthalmology) 06/21/20-Christine Mccuen(Ophthalmology)  Hospital visits:  None in previous 6 months  Medications: Outpatient Encounter Medications as of 12/12/2020  Medication Sig  . atorvastatin (LIPITOR) 20 MG tablet Take 1 tablet (20 mg total) by mouth at bedtime.  . hydrochlorothiazide (HYDRODIURIL) 25 MG tablet Take 1 tablet (25 mg total) by mouth daily.  Marland Kitchen lisinopril (ZESTRIL) 20 MG tablet Take 1 tablet (20 mg total) by mouth daily.  Marland Kitchen loratadine (CLARITIN) 10 MG tablet Take 1 tablet (10 mg total) by mouth daily.  . Multiple Vitamin (MULTIVITAMIN) capsule Take 1 capsule by mouth daily.  Marland Kitchen omeprazole (PRILOSEC) 20 MG capsule Take 1 capsule (20 mg total) by mouth daily as needed.  . verapamil (CALAN-SR) 240 MG CR tablet Take 1 tablet (240 mg total) by mouth at bedtime.   No facility-administered encounter medications on file as of 12/12/2020.    Have you had any problems recently with your health? Patient states she has not had any problems recently with her health.  Have you had any problems with your pharmacy? Patient states she does not have any problems with her pharmacy.  What issues or side effects are you having with your medications? Patient states she is not having any side effects from her medications that she knows of.  What would you like me to pass along to [employer] for them to help you with?  Patient states there is nothing at this time but will call if anything questions or concerns arise.  What can we do to take care of you better? Patient states there is nothing at this time but appreciates our call.   Star Rating Drugs: Atorvastatin 20 mg last filled 10/27/20 90 DS Lisinopril 20 mg  last filled 10/14/20 90 DS Verapamil 240 mg last filled 10/14/20 90 DS   [facility] Clinical Pharmacist Assistant [phone]

## 2021-03-03 NOTE — Progress Notes (Signed)
Subjective:   Doris Mcdaniel is a 85 y.o. female who presents for Medicare Annual (Subsequent) preventive examination.  Review of Systems     Cardiac Risk Factors include: advanced age (>33mn, >>39women);hypertension;dyslipidemia;sedentary lifestyle     Objective:    Today's Vitals   03/06/21 1015 03/06/21 1019  BP: (!) 158/80   Pulse: (!) 58   Resp: 16   Temp: 97.8 F (36.6 C)   TempSrc: Temporal   SpO2: 97%   Weight: 176 lb 9.6 oz (80.1 kg)   Height: '5\' 6"'$  (1.676 m)   PainSc:  3    Body mass index is 28.5 kg/m.  Advanced Directives 03/06/2021 03/04/2020 03/03/2019 02/17/2018 01/17/2017  Does Patient Have a Medical Advance Directive? Yes Yes Yes Yes Yes  Type of AParamedicof AOscodaLiving will HBrantleyLiving will HBallyLiving will HOlneyLiving will HNeoshoLiving will  Does patient want to make changes to medical advance directive? - No - Patient declined No - Patient declined No - Patient declined -  Copy of HScraperin Chart? No - copy requested No - copy requested Yes - validated most recent copy scanned in chart (See row information) No - copy requested No - copy requested    Current Medications (verified) Outpatient Encounter Medications as of 03/06/2021  Medication Sig   atorvastatin (LIPITOR) 20 MG tablet Take 1 tablet (20 mg total) by mouth at bedtime.   hydrochlorothiazide (HYDRODIURIL) 25 MG tablet Take 1 tablet (25 mg total) by mouth daily.   lisinopril (ZESTRIL) 20 MG tablet Take 1 tablet (20 mg total) by mouth daily.   loratadine (CLARITIN) 10 MG tablet Take 1 tablet (10 mg total) by mouth daily.   Multiple Vitamin (MULTIVITAMIN) capsule Take 1 capsule by mouth daily.   omeprazole (PRILOSEC) 20 MG capsule Take 1 capsule (20 mg total) by mouth daily as needed.   verapamil (CALAN-SR) 240 MG CR tablet Take 1 tablet (240 mg total)  by mouth at bedtime.   No facility-administered encounter medications on file as of 03/06/2021.    Allergies (verified) Patient has no known allergies.   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, age indertiminate    Allergic rhinitis    GERD (gastroesophageal reflux disease)    History of breast cancer 2012   left breast   Hyperlipidemia    Hypertension    Personal history of radiation therapy    Past Surgical History:  Procedure Laterality Date   ABDOMINAL HYSTERECTOMY     BREAST BIOPSY Left 01/24/2011   BREAST LUMPECTOMY Left 02/21/2011   BREAST SURGERY     lumpectomy (L)   CATARACT EXTRACTION     B   CHOLECYSTECTOMY  03/2008   OOPHORECTOMY     they left a part of the ovarly   Family History  Problem Relation Age of Onset   Heart disease Brother        CABG age ~ 469  Colon cancer Neg Hx    Breast cancer Neg Hx    Stroke Neg Hx    Hypertension Neg Hx    Diabetes Neg Hx    Social History   Socioeconomic History   Marital status: Married    Spouse name: Not on file   Number of children: 2   Years of education: Not  on file   Highest education level: Not on file  Occupational History   Occupation: retired  Tobacco Use   Smoking status: Former    Types: Cigarettes    Quit date: 07/08/1973    Years since quitting: 47.6   Smokeless tobacco: Never   Tobacco comments:    used to smoke 1 PPD  Substance and Sexual Activity   Alcohol use: No   Drug use: No   Sexual activity: Not Currently  Other Topics Concern   Not on file  Social History Narrative   Lives w/ husband . 2 children live near by   Social Determinants of Health   Financial Resource Strain: Low Risk    Difficulty of Paying Living Expenses: Not hard at all  Food Insecurity: No Food Insecurity   Worried About Charity fundraiser in the Last Year: Never true   Arboriculturist in the Last Year: Never  true  Transportation Needs: No Transportation Needs   Lack of Transportation (Medical): No   Lack of Transportation (Non-Medical): No  Physical Activity: Inactive   Days of Exercise per Week: 0 days   Minutes of Exercise per Session: 0 min  Stress: No Stress Concern Present   Feeling of Stress : Not at all  Social Connections: Moderately Isolated   Frequency of Communication with Friends and Family: More than three times a week   Frequency of Social Gatherings with Friends and Family: Once a week   Attends Religious Services: Never   Marine scientist or Organizations: No   Attends Music therapist: Never   Marital Status: Married    Tobacco Counseling Counseling given: Not Answered Tobacco comments: used to smoke 1 PPD   Clinical Intake:  Pre-visit preparation completed: Yes  Pain : 0-10 Pain Score: 3  Pain Location: Leg Pain Orientation: Left Pain Onset: In the past 7 days Pain Frequency: Intermittent     Nutritional Status: BMI 25 -29 Overweight Nutritional Risks: None Diabetes: No  How often do you need to have someone help you when you read instructions, pamphlets, or other written materials from your doctor or pharmacy?: 1 - Never  Diabetic?No  Interpreter Needed?: No  Information entered by :: Caroleen Hamman LPN   Activities of Daily Living In your present state of health, do you have any difficulty performing the following activities: 03/06/2021  Hearing? N  Vision? N  Difficulty concentrating or making decisions? N  Walking or climbing stairs? N  Dressing or bathing? N  Doing errands, shopping? N  Preparing Food and eating ? N  Using the Toilet? N  In the past six months, have you accidently leaked urine? N  Do you have problems with loss of bowel control? N  Managing your Medications? N  Managing your Finances? N  Housekeeping or managing your Housekeeping? N  Some recent data might be hidden     Patient Care Team: Colon Branch, MD as PCP - General Luberta Mutter, MD as Consulting Physician (Ophthalmology) Edythe Clarity, [facility] (Pharmacist)  Indicate any recent Medical Services you may have received from other than [facility] providers in the past year (date may be approximate).     Assessment:   This is a routine wellness examination for Doris Mcdaniel.  Hearing/Vision screen Hearing Screening - Comments:: No issues Vision Screening - Comments:: Last eye exam-06/2020-Dr. McCuwen  Dietary issues and exercise activities discussed: Current Exercise Habits: Home exercise routine;The patient does not participate in regular exercise  at present   Goals Addressed             This Visit's Progress    Maintain current health   On track    "Do what I want to do such as mow and clean"       Depression Screen PHQ 2/9 Scores 03/06/2021 03/04/2020 03/03/2019 02/17/2018 01/17/2017 07/09/2016 07/05/2015  PHQ - 2 Score 1 0 0 1 1 0 0  PHQ- 9 Score - - - - - - -    Fall Risk Fall Risk  03/06/2021 03/08/2020 03/04/2020 03/03/2019 02/17/2018  Falls in the past year? 0 0 0 0 No  Comment - Emmi Telephone Survey: data to providers prior to load - - -  Number falls in past yr: 0 - 0 - -  Injury with Fall? 0 - 0 - -  Follow up Falls prevention discussed - Education provided;Falls prevention discussed - -    FALL RISK PREVENTION PERTAINING TO THE HOME:  Any stairs in or around the home? Yes  If so, are there any without handrails? No  Home free of loose throw rugs in walkways, pet beds, electrical cords, etc? Yes  Adequate lighting in your home to reduce risk of falls? Yes   ASSISTIVE DEVICES UTILIZED TO PREVENT FALLS:  Life alert? No  Use of a cane, walker or w/c? No  Grab bars in the bathroom? Yes  Shower chair or bench in shower? Yes  Elevated toilet seat or a handicapped toilet? No   TIMED UP AND GO:  Was the test performed? Yes .  Length of time to ambulate 10 feet: 10 sec.   Gait steady and fast without use of  assistive device  Cognitive Function:Normal cognitive status assessed by direct observation by this Nurse Health Advisor. No abnormalities found.   MMSE - Mini Mental State Exam 02/17/2018 01/17/2017  Orientation to time 5 5  Orientation to Place 5 5  Registration 3 3  Attention/ Calculation 5 5  Recall 2 2  Language- name 2 objects 2 2  Language- repeat 1 1  Language- follow 3 step command 3 3  Language- read & follow direction 1 1  Write a sentence 1 1  Copy design 1 0  Total score 29 28        Immunizations Immunization History  Administered Date(s) Administered   Fluad Quad(high Dose 65+) 07/16/2019, 07/18/2020   Influenza Split 07/09/2011, 06/24/2012   Influenza Whole 05/05/2008, 05/06/2009, 07/12/2010   Influenza, High Dose Seasonal PF 06/30/2013, 07/05/2015, 07/09/2016, 07/11/2017, 07/14/2018   Influenza,inj,Quad PF,6+ Mos 07/02/2014   PFIZER(Purple Top)SARS-COV-2 Vaccination 09/17/2019, 10/07/2019, 06/25/2020   Pneumococcal Conjugate-13 07/02/2014   Pneumococcal Polysaccharide-23 07/13/2006, 07/14/2018   Td 05/21/2007, 07/11/2017    TDAP status: Up to date  Flu Vaccine status: Up to date  Pneumococcal vaccine status: Up to date  Covid-19 vaccine status: Information provided on how to obtain vaccines. Booster due  Qualifies for Shingles Vaccine? Yes   Zostavax completed No   Shingrix Completed?: No.    Education has been provided regarding the importance of this vaccine. Patient has been advised to call insurance company to determine out of pocket expense if they have not yet received this vaccine. Advised may also receive vaccine at local pharmacy or Health Dept. Verbalized acceptance and understanding.  Screening Tests Health Maintenance  Topic Date Due   Zoster Vaccines- Shingrix (1 of 2) Never done   COVID-19 Vaccine (4 - Booster for [employer] series) 09/25/2020   INFLUENZA  VACCINE  03/13/2021   TETANUS/TDAP  07/12/2027   DEXA SCAN  Completed   PNA vac Low  Risk Adult  Completed   HPV VACCINES  Aged Out    Health Maintenance  Health Maintenance Due  Topic Date Due   Zoster Vaccines- Shingrix (1 of 2) Never done   COVID-19 Vaccine (4 - Booster for [employer] series) 09/25/2020    Colorectal cancer screening: No longer required.   Mammogram status: Declined  Bone Density status: Declined  Lung Cancer Screening: (Low Dose CT Chest recommended if Age 8-80 years, 30 pack-year currently smoking OR have quit w/in 15years.) does not qualify.     Additional Screening:  Hepatitis C Screening: does not qualify  Vision Screening: Recommended annual ophthalmology exams for early detection of glaucoma and other disorders of the eye. Is the patient up to date with their annual eye exam?  Yes  Who is the provider or what is the name of the office in which the patient attends annual eye exams? Dr. Brayton El   Dental Screening: Recommended annual dental exams for proper oral hygiene  Community Resource Referral / Chronic Care Management: CRR required this visit?  No   CCM required this visit?  No      Plan:     I have personally reviewed and noted the following in the patient's chart:   Medical and social history Use of alcohol, tobacco or illicit drugs  Current medications and supplements including opioid prescriptions.  Functional ability and status Nutritional status Physical activity Advanced directives List of other physicians Hospitalizations, surgeries, and ER visits in previous 12 months Vitals Screenings to include cognitive, depression, and falls Referrals and appointments  In addition, I have reviewed and discussed with patient certain preventive protocols, quality metrics, and best practice recommendations. A written personalized care plan for preventive services as well as general preventive health recommendations were provided to patient.     Marta Antu, LPN   [id number]  Nurse Health Advisor  Nurse Notes:  None

## 2021-03-06 ENCOUNTER — Ambulatory Visit (INDEPENDENT_AMBULATORY_CARE_PROVIDER_SITE_OTHER): Payer: PPO

## 2021-03-06 ENCOUNTER — Ambulatory Visit: Payer: PPO | Admitting: *Deleted

## 2021-03-06 ENCOUNTER — Other Ambulatory Visit: Payer: Self-pay

## 2021-03-06 VITALS — BP 158/80 | HR 58 | Temp 97.8°F | Resp 16 | Ht 66.0 in | Wt 176.6 lb

## 2021-03-06 DIAGNOSIS — Z0000 Encounter for general adult medical examination without abnormal findings: Secondary | ICD-10-CM

## 2021-03-06 NOTE — Patient Instructions (Addendum)
Doris Mcdaniel , Thank you for taking time to come for your Medicare Wellness Visit. I appreciate your ongoing commitment to your health goals. Please review the following plan we discussed and let me know if I can assist you in the future.   Screening recommendations/referrals: Colonoscopy: No longer required Mammogram: Due-Declined today. Please call the office to schedule if you change your mind. Bone Density: Due-Declined today. Please call the office to schedule if you change your mind. Recommended yearly ophthalmology/optometry visit for glaucoma screening and checkup Recommended yearly dental visit for hygiene and checkup  Vaccinations: Influenza vaccine: Up to date Pneumococcal vaccine: Up to date Tdap vaccine: Up to date-Due 07/12/2027 Shingles vaccine: Discuss with pharmacy   Covid-19:Booster due-May obtain vaccine at your local pharmacy.  Advanced directives: Please bring a copy for your chart  Conditions/risks identified: See problem list  Next appointment: Follow up in one year for your annual wellness visit 03/08/2022 @ 10:20   Preventive Care 65 Years and Older, Female Preventive care refers to lifestyle choices and visits with your health care provider that can promote health and wellness. What does preventive care include? A yearly physical exam. This is also called an annual well check. Dental exams once or twice a year. Routine eye exams. Ask your health care provider how often you should have your eyes checked. Personal lifestyle choices, including: Daily care of your teeth and gums. Regular physical activity. Eating a healthy diet. Avoiding tobacco and drug use. Limiting alcohol use. Practicing safe sex. Taking low-dose aspirin every day. Taking vitamin and mineral supplements as recommended by your health care provider. What happens during an annual well check? The services and screenings done by your health care provider during your annual well check will  depend on your age, overall health, lifestyle risk factors, and family history of disease. Counseling  Your health care provider may ask you questions about your: Alcohol use. Tobacco use. Drug use. Emotional well-being. Home and relationship well-being. Sexual activity. Eating habits. History of falls. Memory and ability to understand (cognition). Work and work Statistician. Reproductive health. Screening  You may have the following tests or measurements: Height, weight, and BMI. Blood pressure. Lipid and cholesterol levels. These may be checked every 5 years, or more frequently if you are over 22 years old. Skin check. Lung cancer screening. You may have this screening every year starting at age 41 if you have a 30-pack-year history of smoking and currently smoke or have quit within the past 15 years. Fecal occult blood test (FOBT) of the stool. You may have this test every year starting at age 22. Flexible sigmoidoscopy or colonoscopy. You may have a sigmoidoscopy every 5 years or a colonoscopy every 10 years starting at age 33. Hepatitis C blood test. Hepatitis B blood test. Sexually transmitted disease (STD) testing. Diabetes screening. This is done by checking your blood sugar (glucose) after you have not eaten for a while (fasting). You may have this done every 1-3 years. Bone density scan. This is done to screen for osteoporosis. You may have this done starting at age 17. Mammogram. This may be done every 1-2 years. Talk to your health care provider about how often you should have regular mammograms. Talk with your health care provider about your test results, treatment options, and if necessary, the need for more tests. Vaccines  Your health care provider may recommend certain vaccines, such as: Influenza vaccine. This is recommended every year. Tetanus, diphtheria, and acellular pertussis (Tdap, Td) vaccine. You  may need a Td booster every 10 years. Zoster vaccine. You may  need this after age 4. Pneumococcal 13-valent conjugate (PCV13) vaccine. One dose is recommended after age 82. Pneumococcal polysaccharide (PPSV23) vaccine. One dose is recommended after age 19. Talk to your health care provider about which screenings and vaccines you need and how often you need them. This information is not intended to replace advice given to you by your health care provider. Make sure you discuss any questions you have with your health care provider. Document Released: 08/26/2015 Document Revised: 04/18/2016 Document Reviewed: 05/31/2015 Elsevier Interactive Patient Education  2017 [place] Prevention in the Home Falls can cause injuries. They can happen to people of all ages. There are many things you can do to make your home safe and to help prevent falls. What can I do on the outside of my home? Regularly fix the edges of walkways and driveways and fix any cracks. Remove anything that might make you trip as you walk through a door, such as a raised step or threshold. Trim any bushes or trees on the path to your home. Use bright outdoor lighting. Clear any walking paths of anything that might make someone trip, such as rocks or tools. Regularly check to see if handrails are loose or broken. Make sure that both sides of any steps have handrails. Any raised decks and porches should have guardrails on the edges. Have any leaves, snow, or ice cleared regularly. Use sand or salt on walking paths during winter. Clean up any spills in your garage right away. This includes oil or grease spills. What can I do in the bathroom? Use night lights. Install grab bars by the toilet and in the tub and shower. Do not use towel bars as grab bars. Use non-skid mats or decals in the tub or shower. If you need to sit down in the shower, use a plastic, non-slip stool. Keep the floor dry. Clean up any water that spills on the floor as soon as it happens. Remove soap buildup in  the tub or shower regularly. Attach bath mats securely with double-sided non-slip rug tape. Do not have throw rugs and other things on the floor that can make you trip. What can I do in the bedroom? Use night lights. Make sure that you have a light by your bed that is easy to reach. Do not use any sheets or blankets that are too big for your bed. They should not hang down onto the floor. Have a firm chair that has side arms. You can use this for support while you get dressed. Do not have throw rugs and other things on the floor that can make you trip. What can I do in the kitchen? Clean up any spills right away. Avoid walking on wet floors. Keep items that you use a lot in easy-to-reach places. If you need to reach something above you, use a strong step stool that has a grab bar. Keep electrical cords out of the way. Do not use floor polish or wax that makes floors slippery. If you must use wax, use non-skid floor wax. Do not have throw rugs and other things on the floor that can make you trip. What can I do with my stairs? Do not leave any items on the stairs. Make sure that there are handrails on both sides of the stairs and use them. Fix handrails that are broken or loose. Make sure that handrails are as long as the  stairways. Check any carpeting to make sure that it is firmly attached to the stairs. Fix any carpet that is loose or worn. Avoid having throw rugs at the top or bottom of the stairs. If you do have throw rugs, attach them to the floor with carpet tape. Make sure that you have a light switch at the top of the stairs and the bottom of the stairs. If you do not have them, ask someone to add them for you. What else can I do to help prevent falls? Wear shoes that: Do not have high heels. Have rubber bottoms. Are comfortable and fit you well. Are closed at the toe. Do not wear sandals. If you use a stepladder: Make sure that it is fully opened. Do not climb a closed  stepladder. Make sure that both sides of the stepladder are locked into place. Ask someone to hold it for you, if possible. Clearly mark and make sure that you can see: Any grab bars or handrails. First and last steps. Where the edge of each step is. Use tools that help you move around (mobility aids) if they are needed. These include: Canes. Walkers. Scooters. Crutches. Turn on the lights when you go into a dark area. Replace any light bulbs as soon as they burn out. Set up your furniture so you have a clear path. Avoid moving your furniture around. If any of your floors are uneven, fix them. If there are any pets around you, be aware of where they are. Review your medicines with your doctor. Some medicines can make you feel dizzy. This can increase your chance of falling. Ask your doctor what other things that you can do to help prevent falls. This information is not intended to replace advice given to you by your health care provider. Make sure you discuss any questions you have with your health care provider. Document Released: 05/26/2009 Document Revised: 01/05/2016 Document Reviewed: 09/03/2014 Elsevier Interactive Patient Education  2017 [employer].

## 2021-04-05 ENCOUNTER — Ambulatory Visit (INDEPENDENT_AMBULATORY_CARE_PROVIDER_SITE_OTHER): Payer: PPO | Admitting: Pharmacist

## 2021-04-05 DIAGNOSIS — I10 Essential (primary) hypertension: Secondary | ICD-10-CM

## 2021-04-05 DIAGNOSIS — E785 Hyperlipidemia, unspecified: Secondary | ICD-10-CM | POA: Diagnosis not present

## 2021-04-05 DIAGNOSIS — J3089 Other allergic rhinitis: Secondary | ICD-10-CM

## 2021-04-05 DIAGNOSIS — K219 Gastro-esophageal reflux disease without esophagitis: Secondary | ICD-10-CM

## 2021-04-05 NOTE — Chronic Care Management (AMB) (Signed)
Chronic Care Management Pharmacy Note  04/05/2021 Name:  Doris Mcdaniel MRN:  [id number] DOB:  1935/11/05  Subjective: Doris Mcdaniel is an 85 y.o. year old female who is a primary patient of Paz, Alda Berthold, MD.  The CCM team was consulted for assistance with disease management and care coordination needs.    Engaged with patient by telephone for follow up visit in response to provider referral for pharmacy case management and/or care coordination services.   Consent to Services:  The patient was given information about Chronic Care Management services, agreed to services, and gave verbal consent prior to initiation of services.  Please see initial visit note for detailed documentation.   Patient Care Team: Colon Branch, MD as PCP - General Luberta Mutter, MD as Consulting Physician (Ophthalmology) Cherre Robins, PharmD (Pharmacist)  Recent office visits: 07/18/20- PCP (Dr Larose Kells) annual PE. No med changes. Received annual flu vaccine.  Recent consult visits: No recent consults in last 6 months  Hospital visits: None in previous 6 months  Objective:  Lab Results  Component Value Date   CREATININE 1.32 (H) 07/18/2020   CREATININE 1.34 (H) 07/16/2019   CREATININE 1.27 (H) 07/14/2018    Lab Results  Component Value Date   HGBA1C 5.6 07/14/2018   Last diabetic Eye exam: No results found for: HMDIABEYEEXA  Last diabetic Foot exam: No results found for: HMDIABFOOTEX      Component Value Date/Time   CHOL 131 07/18/2020 1039   TRIG 125.0 07/18/2020 1039   HDL 47.30 07/18/2020 1039   CHOLHDL 3 07/18/2020 1039   VLDL 25.0 07/18/2020 1039   LDLCALC 59 07/18/2020 1039   LDLDIRECT 133.6 06/26/2012 0826    Hepatic Function Latest Ref Rng & Units 07/18/2020 09/22/2019 07/16/2019  Total Protein 6.0 - 8.3 g/dL 6.7 - 6.8  Albumin 3.5 - 5.2 g/dL 4.1 - 4.3  AST 0 - 37 U/L '14 14 13  ' ALT 0 - 35 U/L '9 10 9  ' Alk Phosphatase 39 - 117 U/L 60 - 58  Total Bilirubin 0.2 - 1.2  mg/dL 0.7 - 0.6  Bilirubin, Direct 0.0 - 0.3 mg/dL - - -    Lab Results  Component Value Date/Time   TSH 1.40 07/14/2018 11:05 AM   TSH 1.18 07/09/2016 10:51 AM    CBC Latest Ref Rng & Units 07/18/2020 07/16/2019 07/14/2018  WBC 4.0 - 10.5 K/uL 5.9 7.9 7.4  Hemoglobin 12.0 - 15.0 g/dL 12.1 12.5 12.6  Hematocrit 36.0 - 46.0 % 36.3 38.9 38.2  Platelets 150.0 - 400.0 K/uL 205.0 288.0 291.0    Lab Results  Component Value Date/Time   VD25OH 45 07/12/2010 08:19 PM    Clinical ASCVD: No  The ASCVD Risk score Mikey Bussing DC Jr., et al., 2013) failed to calculate for the following reasons:   The 2013 ASCVD risk score is only valid for ages 50 to 63    Other: Last DEXA 2018 - normal   Social History   Tobacco Use  Smoking Status Former   Types: Cigarettes   Quit date: 07/08/1973   Years since quitting: 47.7  Smokeless Tobacco Never  Tobacco Comments   used to smoke 1 PPD   BP Readings from Last 3 Encounters:  03/06/21 (!) 158/80  07/18/20 132/74  04/04/20 (!) 129/34   Pulse Readings from Last 3 Encounters:  03/06/21 (!) 58  07/18/20 62  04/04/20 (!) 57   Wt Readings from Last 3 Encounters:  03/06/21 176 lb 9.6 oz (  80.1 kg)  07/18/20 177 lb (80.3 kg)  04/04/20 178 lb 6.4 oz (80.9 kg)    Assessment: Review of patient past medical history, allergies, medications, health status, including review of consultants reports, laboratory and other test data, was performed as part of comprehensive evaluation and provision of chronic care management services.   SDOH:  (Social Determinants of Health) assessments and interventions performed:  SDOH Interventions    Flowsheet Row Most Recent Value  SDOH Interventions   Financial Strain Interventions Intervention Not Indicated       CCM Care Plan  No Known Allergies  Medications Reviewed Today     Reviewed by Cherre Robins, PharmD (Pharmacist) on 04/05/21 at 1014  Med List Status: <None>   Medication Order Taking? Sig  Documenting Provider Last Dose Status Informant  atorvastatin (LIPITOR) 20 MG tablet [id number] Yes Take 1 tablet (20 mg total) by mouth at bedtime. Colon Branch, MD Taking Active   hydrochlorothiazide (HYDRODIURIL) 25 MG tablet [id number] Yes Take 1 tablet (25 mg total) by mouth daily. Colon Branch, MD Taking Active            Med Note Antony Contras, [place] B   Wed Apr 05, 2021 10:11 AM) Taking at night - patient preference  lisinopril (ZESTRIL) 20 MG tablet [id number] Yes Take 1 tablet (20 mg total) by mouth daily. Colon Branch, MD Taking Active   loratadine (CLARITIN) 10 MG tablet [id number] Yes Take 1 tablet (10 mg total) by mouth daily. Colon Branch, MD Taking Active   Multiple Vitamin (MULTIVITAMIN) capsule [id number] Yes Take 1 capsule by mouth daily. [provider] Taking Active   omeprazole (PRILOSEC) 20 MG capsule [id number] No Take 1 capsule (20 mg total) by mouth daily as needed.  Patient not taking: Reported on 04/05/2021   Colon Branch, MD Not Taking Active   verapamil (CALAN-SR) 240 MG CR tablet [id number] Yes Take 1 tablet (240 mg total) by mouth at bedtime. Colon Branch, MD Taking Active             Patient Active Problem List   Diagnosis Date Noted   Gait disorder 07/19/2020   PCP NOTES >>>>> 07/05/2015   Depression 06/24/2012   History of breast cancer    Breast cancer, stage 0 03/08/2011   Annual physical exam 01/12/2011   DYSPNEA ON EXERTION 05/06/2009   Compression fracture of spine-- DEXA (-) 2014, declined meds  11/04/2008   Allergic rhinitis 11/03/2008   *GERD 09/23/2007   Joint pain 11/19/2006   Hyperlipidemia  09/17/2006   pcp notes---HTN 09/17/2006    Immunization History  Administered Date(s) Administered   Fluad Quad(high Dose 65+) 07/16/2019, 07/18/2020   Influenza Split 07/09/2011, 06/24/2012   Influenza Whole 05/05/2008, 05/06/2009, 07/12/2010   Influenza, High Dose Seasonal PF 06/30/2013, 07/05/2015, 07/09/2016, 07/11/2017, 07/14/2018    Influenza,inj,Quad PF,6+ Mos 07/02/2014   PFIZER(Purple Top)SARS-COV-2 Vaccination 09/17/2019, 10/07/2019, 06/25/2020   Pneumococcal Conjugate-13 07/02/2014   Pneumococcal Polysaccharide-23 07/13/2006, 07/14/2018   Td 05/21/2007, 07/11/2017    Conditions to be addressed/monitored: HTN, HLD, and GERD; h/o breast cancer; allergies  Care Plan : General Pharmacy (Adult)  Updates made by Cherre Robins, PHARMD since 04/05/2021 12:00 AM     Problem: HTN, GERD, HLD, Allergic Rhinitis, Depression, Joint Pain, Skin lesion on face   Priority: High  Onset Date: 10/05/2020     Long-Range Goal: Patient-Specific Goal   Start Date: 10/05/2020  Expected End Date: 04/04/2021  Recent Progress: On track  Priority: High  Note:   Current Barriers:  Education and support for chronic conditions and medications  Pharmacist Clinical Goal(s):  Over the next 180 days, patient will achieve adherence to monitoring guidelines and medication adherence to achieve therapeutic efficacy maintain control of blood pressure as evidenced by home monitoring  contact provider office for questions/concerns as evidenced notation of same in electronic health record through collaboration with PharmD and provider.   Interventions: 1:1 collaboration with Colon Branch, MD regarding development and update of comprehensive plan of care as evidenced by provider attestation and co-signature Inter-disciplinary care team collaboration (see longitudinal plan of care) Comprehensive medication review performed; medication list updated in electronic medical record  Hypertension (BP goal <140/90) Controlled Current treatment: hydrochlorothiazide 59m daily (patient takes at bedtime)  Lisinopril 212mdaily in the morning Verapamil 24050maily at bedtime Medications previously tried: none noted  Patient prefers to take HCTZ at night. States she would rather get up during night than have to run to bathroom during daytime.  Current home  readings: has not checked BP in the last month Current dietary habits:  Eats lots of vegetables Limits salt intake Beverages - mostly water or sweet tea; sometimes regular Pepsi Current exercise habits: very active with yardwork, gardening and house chores.  Used to walk in her neighborhood but has not done as much since the person she used to walk with passed away. Denies hypotensive/hypertensive symptoms Interventions:  Reviewed blood pressure goals and benefits of medications for prevention of heart attack, stroke and kidney damage; Recommended she check BP at home periodically, document, and provide log at future appointments Recommended to continue current medication  Recommend resume walking or [employer]  Hyperlipidemia: (LDL goal < 100) Controlled Current treatment: Atorvastatin 20m47mily Medications previously tried: none noted  Interventions:  Reviewed last lipid panel and reviewed cholesterol goals;  Reviewed importance of limiting foods high in cholesterol; Recommend continue current medications  Allergic rhinitis (Goal: minimize symptoms) Controlled Current treatment  Loratadine 10mg62mly Medications previously tried: none noted States she has been on this for years with good relief of allergy symptoms Interventions:  Recommended to continue current medication  GERD:  Controlled Current treatment:  Omeprazole 20mg 38my as needed Report she has not taken omeprazole in the last month Rarely has reflux symptoms Food triggers - greasy food (which she tries to avoid)  Intervention:  Continue to use omeprazole as needed   Patient Goals/Self-Care Activities Over the next 180 days, patient will:  take medications as prescribed check blood pressure periodically, document, and provide at future appointments target a minimum of 150 minutes of moderate intensity exercise weekly  Follow Up Plan: The care management team will reach out to the patient again over the next  180 days.        Medication Assistance: None required.  Patient affirms current coverage meets needs.  Patient's preferred pharmacy is:  [facility] [address] [address]: [phone] pill box? No - not needed Pt endorses 100% compliance  Follow Up:  Patient agrees to Care Plan and Follow-up.  Plan: Telephone follow up appointment with care management team member scheduled for:  6 months  Janiylah Hannis Cherre RobinsmD Clinical Pharmacist [facility]

## 2021-04-05 NOTE — Patient Instructions (Signed)
Mrs. Drozda,  It was a pleasure speaking with you today.  I have attached a summary of our visit today and information about your health goals.  If you have any questions or concerns, please feel free to contact me either at the phone number below or with a MyChart message.   Keep up the good work!  Cherre Robins, PharmD Clinical Pharmacist [facility] [phone] (direct line)  [phone] (main office number)  Visit Information  PATIENT GOALS:  Goals Addressed             This Visit's Progress    Chronic Care Management Pharmacy Care Plan   On track    [place] (see longitudinal plan of care for additional care plan information)  Current Barriers:  Chronic Disease Management support, education, and care coordination needs related to HTN, GERD, HLD, Allergic Rhinitis, Depression, Joint Pain   Hypertension BP Readings from Last 3 Encounters:  03/06/21 (!) 158/80  07/18/20 132/74  04/04/20 (!) 129/34  Pharmacist Clinical Goal(s): Over the next 180 days, patient will work with PharmD and providers to maintain BP goal <140/90 Current regimen:  Hydrochlorothiazide '25mg'$  daily (patient takes at bedtime)  Lisinopril '20mg'$  daily in the morning Verapamil '240mg'$  daily at bedtime Interventions:  Reviewed blood pressure goals and benefits of medications for prevention of heart attack, stroke and kidney damage Patient self care activities - Over the next 180 days, patient will: Maintain hypertension medication regimen.  Continue to stay active in garden and doing yardwork. Recommend resume walking or [employer] activities.  Hyperlipidemia Lab Results  Component Value Date/Time   LDLCALC 59 07/18/2020 10:39 AM   LDLDIRECT 133.6 06/26/2012 08:26 AM  Pharmacist Clinical Goal(s): Over the next 180 days, patient will work with PharmD and providers to maintain LDL goal < 100 Current regimen:  Atorvastatin '20mg'$  daily Interventions:  Reviewed  last lipid panel and reviewed cholesterol goals;  Reviewed importance of limiting foods high in cholesterol; Recommend continue current medications Patient self care activities - Over the next 180 days, patient will: Maintain current cholesterol medication regimen.   Acid Reflux:  Pharmacist Clinical Goal(s) Over the next 180 days, patient will work with PharmD and providers to maintain control of acid reflux / heartburn Current regimen:  Omeprazole '20mg'$  daily as needed Interventions: none Patient self care activities - Over the next 180 days, patient will:  Continue to use omeprazole as needed  Medication management Pharmacist Clinical Goal(s): Over the next 180 days, patient will work with PharmD and providers to maintain optimal medication adherence Current pharmacy: [facility] Interventions Comprehensive medication review performed. Continue current medication management strategy Patient self care activities - Over the next 180 days, patient will: Focus on medication adherence by filling and taking medications appropriately  Take medications as prescribed Report any questions or concerns to PharmD and/or provider(s)   Patient Goals/Self-Care Activities Over the next 180 days, patient will:  take medications as prescribed check blood pressure periodically, document, and provide at future appointments target a minimum of 150 minutes of moderate intensity exercise weekly    Please see past updates related to this goal by clicking on the "Past Updates" button in the selected goal         The patient verbalized understanding of instructions, educational materials, and care plan provided today and agreed to receive a mailed copy of patient instructions, educational materials, and care plan.   Telephone follow up appointment with care management team member scheduled for:  6 months

## 2021-06-14 ENCOUNTER — Telehealth: Payer: Self-pay | Admitting: Pharmacist

## 2021-06-14 NOTE — Chronic Care Management (AMB) (Signed)
    Chronic Care Management Pharmacy Assistant   Name: Doris Mcdaniel  MRN: [id number] DOB: 05/26/1936  Reason for Encounter: General  Recent office visits:  None noted  Recent consult visits:  None noted  Hospital visits:  None in previous 6 months  Medications: Outpatient Encounter Medications as of 06/14/2021  Medication Sig Note   atorvastatin (LIPITOR) 20 MG tablet Take 1 tablet (20 mg total) by mouth at bedtime.    hydrochlorothiazide (HYDRODIURIL) 25 MG tablet Take 1 tablet (25 mg total) by mouth daily. 04/05/2021: Taking at night - patient preference   lisinopril (ZESTRIL) 20 MG tablet Take 1 tablet (20 mg total) by mouth daily.    loratadine (CLARITIN) 10 MG tablet Take 1 tablet (10 mg total) by mouth daily.    Multiple Vitamin (MULTIVITAMIN) capsule Take 1 capsule by mouth daily.    omeprazole (PRILOSEC) 20 MG capsule Take 1 capsule (20 mg total) by mouth daily as needed. (Patient not taking: Reported on 04/05/2021)    verapamil (CALAN-SR) 240 MG CR tablet Take 1 tablet (240 mg total) by mouth at bedtime.    No facility-administered encounter medications on file as of 06/14/2021.   Have you had any problems recently with your health? Patient states she has been doing well and has no health concerns at the time.  Have you had any problems with your pharmacy? Patient states she has no problems with her pharmacy.  What issues or side effects are you having with your medications? Patient states she has no issues or side effects to her medications.  What would you like me to pass along to [place] for them to help you with?  Patient states there is nothing at this time.  What can we do to take care of you better? Patient states there is nothing at this time.  Star Rating Drugs: Atorvastatin 20 mg Last filled: Lisinopril 20 mg Last filled:  Corrie Mckusick, [place]

## 2021-06-23 DIAGNOSIS — H353131 Nonexudative age-related macular degeneration, bilateral, early dry stage: Secondary | ICD-10-CM | POA: Diagnosis not present

## 2021-06-23 DIAGNOSIS — D3131 Benign neoplasm of right choroid: Secondary | ICD-10-CM | POA: Diagnosis not present

## 2021-06-23 DIAGNOSIS — H52203 Unspecified astigmatism, bilateral: Secondary | ICD-10-CM | POA: Diagnosis not present

## 2021-06-23 DIAGNOSIS — H26492 Other secondary cataract, left eye: Secondary | ICD-10-CM | POA: Diagnosis not present

## 2021-07-13 NOTE — Telephone Encounter (Signed)
Reviewed adherence for atorvastatin and lisinopril - patient has filled on time for 2022. Next refill due around 07/15/2021  Atorvastatin Calcium  Dispensed Days Supply Quantity Provider Pharmacy  ATORVASTATIN 20MG    TAB 04/15/2021 90 90 each Colon Branch, MD [facility] [phone]...  ATORVASTATIN 20MG    TAB 01/20/2021 90 90 each Colon Branch, MD [facility] [phone]...  ATORVASTATIN 20MG    TAB 10/27/2020 90 90 each Colon Branch, MD [facility] [phone]...    Lisinopril  Dispensed Days Supply Quantity Provider Pharmacy  LISINOPRIL 20MG      TAB 04/19/2021 90 90 each Colon Branch, MD [facility] [phone]...  LISINOPRIL 20MG      TAB 01/16/2021 90 90 each Colon Branch, MD [facility] [phone]...  LISINOPRIL 20MG  TABLET 10/14/2020 90 90 tablet  [facility]

## 2021-07-20 ENCOUNTER — Encounter: Payer: Self-pay | Admitting: Internal Medicine

## 2021-07-20 ENCOUNTER — Ambulatory Visit: Payer: PPO | Attending: Internal Medicine

## 2021-07-20 ENCOUNTER — Ambulatory Visit (INDEPENDENT_AMBULATORY_CARE_PROVIDER_SITE_OTHER): Payer: PPO | Admitting: Internal Medicine

## 2021-07-20 VITALS — BP 138/68 | HR 68 | Temp 97.8°F | Resp 18 | Ht 66.0 in | Wt 177.1 lb

## 2021-07-20 DIAGNOSIS — I10 Essential (primary) hypertension: Secondary | ICD-10-CM | POA: Diagnosis not present

## 2021-07-20 DIAGNOSIS — Z0000 Encounter for general adult medical examination without abnormal findings: Secondary | ICD-10-CM | POA: Diagnosis not present

## 2021-07-20 DIAGNOSIS — Z23 Encounter for immunization: Secondary | ICD-10-CM

## 2021-07-20 DIAGNOSIS — R739 Hyperglycemia, unspecified: Secondary | ICD-10-CM | POA: Diagnosis not present

## 2021-07-20 DIAGNOSIS — E785 Hyperlipidemia, unspecified: Secondary | ICD-10-CM

## 2021-07-20 LAB — LIPID PANEL
Cholesterol: 121 mg/dL (ref 0–200)
HDL: 48 mg/dL (ref >39.00)
LDL Cholesterol: 47 mg/dL (ref 0–99)
NonHDL: 73.35
Total CHOL/HDL Ratio: 3
Triglycerides: 131 mg/dL (ref 0.0–149.0)
VLDL: 26.2 mg/dL (ref 0.0–40.0)

## 2021-07-20 LAB — COMPREHENSIVE METABOLIC PANEL
ALT: 10 U/L (ref 0–35)
AST: 14 U/L (ref 0–37)
Albumin: 3.9 g/dL (ref 3.5–5.2)
Alkaline Phosphatase: 61 U/L (ref 39–117)
BUN: 20 mg/dL (ref 6–23)
CO2: 28 mEq/L (ref 19–32)
Calcium: 8.6 mg/dL (ref 8.4–10.5)
Chloride: 104 mEq/L (ref 96–112)
Creatinine, Ser: 1.4 mg/dL — ABNORMAL HIGH (ref 0.40–1.20)
GFR: 34.93 mL/min — ABNORMAL LOW (ref >60.00)
Glucose, Bld: 101 mg/dL — ABNORMAL HIGH (ref 70–99)
Potassium: 3.9 mEq/L (ref 3.5–5.1)
Sodium: 140 mEq/L (ref 135–145)
Total Bilirubin: 0.7 mg/dL (ref 0.2–1.2)
Total Protein: 6.3 g/dL (ref 6.0–8.3)

## 2021-07-20 LAB — CBC WITH DIFFERENTIAL/PLATELET
Basophils Absolute: 0.1 10*3/uL (ref 0.0–0.1)
Basophils Relative: 0.9 % (ref 0.0–3.0)
Eosinophils Absolute: 0.3 10*3/uL (ref 0.0–0.7)
Eosinophils Relative: 3.8 % (ref 0.0–5.0)
HCT: 36.1 % (ref 36.0–46.0)
Hemoglobin: 11.9 g/dL — ABNORMAL LOW (ref 12.0–15.0)
Lymphocytes Relative: 18.5 % (ref 12.0–46.0)
Lymphs Abs: 1.3 10*3/uL (ref 0.7–4.0)
MCHC: 33 g/dL (ref 30.0–36.0)
MCV: 88.6 fl (ref 78.0–100.0)
Monocytes Absolute: 0.6 10*3/uL (ref 0.1–1.0)
Monocytes Relative: 7.9 % (ref 3.0–12.0)
Neutro Abs: 5 10*3/uL (ref 1.4–7.7)
Neutrophils Relative %: 68.9 % (ref 43.0–77.0)
Platelets: 211 10*3/uL (ref 150.0–400.0)
RBC: 4.08 Mil/uL (ref 3.87–5.11)
RDW: 13.5 % (ref 11.5–15.5)
WBC: 7.3 10*3/uL (ref 4.0–10.5)

## 2021-07-20 LAB — TSH: TSH: 1.09 u[IU]/mL (ref 0.35–5.50)

## 2021-07-20 LAB — HEMOGLOBIN A1C: Hgb A1c MFr Bld: 5.7 % (ref 4.6–6.5)

## 2021-07-20 NOTE — Progress Notes (Signed)
Subjective:    Patient ID: Leeroy Bock, female    DOB: [birth year], 85 y.o.   MRN: [id number]  DOS:  07/20/2021 Type of visit - description: CPX  since the last visit she is doing okay, has noted mobility is becoming an issue. No recent falls, still mows the yard.   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, age indertiminate    Allergic rhinitis    GERD (gastroesophageal reflux disease)    History of breast cancer 2012   left breast   Hyperlipidemia    Hypertension    Personal history of radiation therapy     Past Surgical History:  Procedure Laterality Date   ABDOMINAL HYSTERECTOMY     BREAST BIOPSY Left 01/24/2011   BREAST LUMPECTOMY Left 02/21/2011   BREAST SURGERY     lumpectomy (L)   CATARACT EXTRACTION     B   CHOLECYSTECTOMY  03/2008   OOPHORECTOMY     they left a part of the ovarly   Social History   Socioeconomic History   Marital status: Married    Spouse name: Not on file   Number of children: 2   Years of education: Not on file   Highest education level: Not on file  Occupational History   Occupation: retired  Tobacco Use   Smoking status: Former    Types: Cigarettes    Quit date: 07/08/1973    Years since quitting: 48.0   Smokeless tobacco: Never   Tobacco comments:    used to smoke 1 PPD  Substance and Sexual Activity   Alcohol use: No   Drug use: No   Sexual activity: Not Currently  Other Topics Concern   Not on file  Social History Narrative   Lives w/ husband . 2 children live near by   Social Determinants of Health   Financial Resource Strain: Low Risk    Difficulty of Paying Living Expenses: Not hard at all  Food Insecurity: No Food Insecurity   Worried About Charity fundraiser in the Last Year: Never true   Arboriculturist in the Last Year: Never true   Transportation Needs: No Transportation Needs   Lack of Transportation (Medical): No   Lack of Transportation (Non-Medical): No  Physical Activity: Inactive   Days of Exercise per Week: 0 days   Minutes of Exercise per Session: 0 min  Stress: No Stress Concern Present   Feeling of Stress : Not at all  Social Connections: Moderately Isolated   Frequency of Communication with Friends and Family: More than three times a week   Frequency of Social Gatherings with Friends and Family: Once a week   Attends Religious Services: Never   Marine scientist or Organizations: No   Attends Archivist Meetings: Never   Marital Status: Married  Human resources officer Violence: Not At Risk   Fear of Current or Ex-Partner: No   Emotionally Abused: No   Physically Abused: No   Sexually Abused: No    Allergies as of 07/20/2021   No Known Allergies      Medication List        Accurate as of July 20, 2021  5:35 PM. If you have any questions, ask your nurse or doctor.  STOP taking these medications    omeprazole 20 MG capsule Commonly known as: PRILOSEC Stopped by: Kathlene November, MD       TAKE these medications    atorvastatin 20 MG tablet Commonly known as: LIPITOR Take 1 tablet (20 mg total) by mouth at bedtime.   hydrochlorothiazide 25 MG tablet Commonly known as: HYDRODIURIL Take 1 tablet (25 mg total) by mouth daily.   lisinopril 20 MG tablet Commonly known as: ZESTRIL Take 1 tablet (20 mg total) by mouth daily.   loratadine 10 MG tablet Commonly known as: CLARITIN Take 1 tablet (10 mg total) by mouth daily.   multivitamin capsule Take 1 capsule by mouth daily.   verapamil 240 MG CR tablet Commonly known as: CALAN-SR Take 1 tablet (240 mg total) by mouth at bedtime.           Objective:   Physical Exam BP 138/68 (BP Location: Left Arm, Patient Position: Sitting, Cuff Size: Small)   Pulse 68   Temp 97.8 F (36.6 C) (Oral)   Resp 18   Ht  5\' 6"  (1.676 m)   Wt 177 lb 2 oz (80.3 kg)   SpO2 96%   BMI 28.59 kg/m  General: Well developed, NAD, BMI noted Neck: No  thyromegaly  HEENT:  Normocephalic . Face symmetric, atraumatic Lungs:  CTA B Normal respiratory effort, no intercostal retractions, no accessory muscle use. Heart: RRR,  no murmur.  Abdomen:  Not distended, soft, non-tender. No rebound or rigidity.   Lower extremities: no pretibial edema bilaterally  Skin: Exposed areas without rash. Not pale. Not jaundice Neurologic:  alert & oriented X3.  Speech normal, gait appropriate for age and unassisted but indeed her mobility is less than previous years.  She needed help transferring to the table. Strength symmetric and appropriate for age.  Psych: Cognition and judgment appear intact.  Cooperative with normal attention span and concentration.  Behavior appropriate. No anxious or depressed appearing.     Assessment    Assessment  HTN Hyperlipidemia GERD DJD  Breast cancer, 2012, left, lumpectomy, XRT. Does not see oncology regularly  MSK: --T8  mild compression per x-ray, declined Rx  --DEXA 2014 and  2018: Normal. Vitamin D normal when checked  PLAN  Here for CPX HTN continue lisinopril, HCTZ, verapamil, checking labs, monitor BPs. High cholesterol: Continue Lipitor, labs. DJD: Mobility is getting limited.  Encouraged and stay active.  Fall prevention discussed.  Encourage vitamin D supplements. RTC 1 year    This visit occurred during the SARS-CoV-2 public health emergency.  Safety protocols were in place, including screening questions prior to the visit, additional usage of staff PPE, and extensive cleaning of exam room while observing appropriate contact time as indicated for disinfecting solutions.

## 2021-07-20 NOTE — Patient Instructions (Addendum)
Check the  blood pressure monthly BP GOAL is between 110/65 and  135/85. If it is consistently higher or lower, let me know  Take vitamin D 2000 units daily  GO TO THE LAB : Get the blood work     Doris Mcdaniel, [name] back for   a physical exam in 1 year     Fall Prevention in the Home, Adult Falls can cause injuries and affect people of all ages. There are many simple things that you can do to make your home safe and to help prevent falls. Ask for help when making these changes, if needed. What actions can I take to prevent falls? General instructions Use good lighting in all rooms. Replace any light bulbs that burn out, turn on lights if it is dark, and use night-lights. Place frequently used items in easy-to-reach places. Lower the shelves around your home if necessary. Set up furniture so that there are clear paths around it. Avoid moving your furniture around. Remove throw rugs and other tripping hazards from the floor. Avoid walking on wet floors. Fix any uneven floor surfaces. Add color or contrast paint or tape to grab bars and handrails in your home. Place contrasting color strips on the first and last steps of staircases. When you use a stepladder, make sure that it is completely opened and that the sides and supports are firmly locked. Have someone hold the ladder while you are using it. Do not climb a closed stepladder. Know where your pets are when moving through your home. What can I do in the bathroom?   Keep the floor dry. Immediately clean up any water that is on the floor. Remove soap buildup in the tub or shower regularly. Use nonskid mats or decals on the floor of the tub or shower. Attach bath mats securely with double-sided, nonslip rug tape. If you need to sit down while you are in the shower, use a plastic, nonslip stool. Install grab bars by the toilet and in the tub and shower. Do not use towel bars as grab  bars. What can I do in the bedroom? Make sure that a bedside light is easy to reach. Do not use oversized bedding that reaches the floor. Have a firm chair that has side arms to use for getting dressed. What can I do in the kitchen? Clean up any spills right away. If you need to reach for something above you, use a sturdy step stool that has a grab bar. Keep electrical cables out of the way. Do not use floor polish or wax that makes floors slippery. If you must use wax, make sure that it is non-skid floor wax. What can I do with my stairs? Do not leave any items on the stairs. Make sure that you have a light switch at the top and the bottom of the stairs. Have them installed if you do not have them. Make sure that there are handrails on both sides of the stairs. Fix handrails that are broken or loose. Make sure that handrails are as long as the staircases. Install non-slip stair treads on all stairs in your home. Avoid having throw rugs at the top or bottom of stairs, or secure the rugs with carpet tape to prevent them from moving. Choose a carpet design that does not hide the edge of steps on the stairs. Check any carpeting to make sure that it is firmly attached to the stairs. Fix any carpet  that is loose or worn. What can I do on the outside of my home? Use bright outdoor lighting. Regularly repair the edges of walkways and driveways and fix any cracks. Remove high doorway thresholds. Trim any shrubbery on the main path into your home. Regularly check that handrails are securely fastened and in good repair. Both sides of all steps should have handrails. Install guardrails along the edges of any raised decks or porches. Clear walkways of debris and clutter, including tools and rocks. Have leaves, snow, and ice cleared regularly. Use sand or salt on walkways during winter months. In the garage, clean up any spills right away, including grease or oil spills. What other actions can I  take? Wear closed-toe shoes that fit well and support your feet. Wear shoes that have rubber soles or low heels. Use mobility aids as needed, such as canes, walkers, scooters, and crutches. Review your medicines with your health care provider. Some medicines can cause dizziness or changes in blood pressure, which increase your risk of falling. Talk with your health care provider about other ways that you can decrease your risk of falls. This may include working with a physical therapist or trainer to improve your strength, balance, and endurance. Where to find more information Centers for Disease Control and Prevention, STEADI: http://www.wolf.info/ National Institute on Aging: http://kim-miller.com/ Contact a health care provider if: You are afraid of falling at home. You feel weak, drowsy, or dizzy at home. You fall at home. Summary There are many simple things that you can do to make your home safe and to help prevent falls. Ways to make your home safe include removing tripping hazards and installing grab bars in the bathroom. Ask for help when making these changes in your home. This information is not intended to replace advice given to you by your health care provider. Make sure you discuss any questions you have with your health care provider. Document Revised: 03/02/2020 Document Reviewed: 03/02/2020 Elsevier Patient Education  Doris Mcdaniel.

## 2021-07-20 NOTE — Progress Notes (Signed)
   Covid-19 Vaccination Clinic  Name:  Doris Mcdaniel    MRN: [id number] DOB: 10-Dec-1935  07/20/2021  [name] was observed post Covid-19 immunization for 15 minutes without incident. She was provided with Vaccine Information Sheet and instruction to access the V-Safe system.   [name] was instructed to call 911 with any severe reactions post vaccine: Difficulty breathing  Swelling of face and throat  A fast heartbeat  A bad rash all over body  Dizziness and weakness   Immunizations Administered     Name Date Dose VIS Date Route   Pfizer Covid-19 Vaccine Bivalent Booster 07/20/2021 12:50 PM 0.3 mL 04/12/2021 Intramuscular   Manufacturer: [place]   Lot: [id number]   [place]: [phone]

## 2021-07-20 NOTE — Assessment & Plan Note (Signed)
-  Td [id number]  - pnm 23: 2007 and 07/2018; prevnar: 2015  - rec  shingrix : declines  - covid vax rec booster  - flu shot today -No furthert PAPs, see previous entries  -H/o breast ca, today she states that she is not doing self breast exams and  declines further MMG.  Patient is very aware that doing routine MMGs could lead to early diagnosis and treatment of breast cancer.  She still declines. -CCS: last colonoscopy 06/2009, negative.   No further screening. -Labs:  CMP, FLP, CBC, A1c, TSH -Diet and exercise discussed  -Fall prevention discussed.  Vitamin D recommended -Brought copies of her healthcare power of attorney

## 2021-07-20 NOTE — Assessment & Plan Note (Signed)
Here for CPX HTN continue lisinopril, HCTZ, verapamil, checking labs, monitor BPs. High cholesterol: Continue Lipitor, labs. DJD: Mobility is getting limited.  Encouraged and stay active.  Fall prevention discussed.  Encourage vitamin D supplements. RTC 1 year

## 2021-07-21 ENCOUNTER — Other Ambulatory Visit (HOSPITAL_BASED_OUTPATIENT_CLINIC_OR_DEPARTMENT_OTHER): Payer: Self-pay

## 2021-07-21 MED ORDER — PFIZER COVID-19 VAC BIVALENT 30 MCG/0.3ML IM SUSP
INTRAMUSCULAR | 0 refills | Status: DC
  Filled 2021-07-21: qty 0.3, 1d supply, fill #0

## 2021-07-22 ENCOUNTER — Other Ambulatory Visit: Payer: Self-pay | Admitting: Internal Medicine

## 2021-08-03 ENCOUNTER — Telehealth: Payer: Self-pay | Admitting: Pharmacist

## 2021-08-03 NOTE — Progress Notes (Signed)
Contacted patients pharmacy about Lisinopril last filled date 07/24/2021 90 DS and Atorvastatin last filled date 07/24/21 90 DS.  Corrie Mckusick, [place]

## 2021-10-10 ENCOUNTER — Ambulatory Visit (INDEPENDENT_AMBULATORY_CARE_PROVIDER_SITE_OTHER): Payer: PPO | Admitting: Pharmacist

## 2021-10-10 DIAGNOSIS — E785 Hyperlipidemia, unspecified: Secondary | ICD-10-CM | POA: Diagnosis not present

## 2021-10-10 DIAGNOSIS — I10 Essential (primary) hypertension: Secondary | ICD-10-CM

## 2021-10-10 DIAGNOSIS — K219 Gastro-esophageal reflux disease without esophagitis: Secondary | ICD-10-CM

## 2021-10-10 MED ORDER — ACETAMINOPHEN 500 MG PO TABS: 1000.0000 mg | ORAL_TABLET | Freq: Two times a day (BID) | ORAL | 0 refills | Status: AC | PRN

## 2021-10-10 NOTE — Patient Instructions (Addendum)
Doris Mcdaniel It was a pleasure speaking with you today.  I have attached a summary of our visit today and information about your health goals.  (See below)   You have met all your Chronic Care Management treatment goals. I have not scheduled a follow up but if you have questions about medications or would like to restart in the Chronic Care Management program again please feel free to contact me either at the phone number below.   Keep up the good work!  Doris Mcdaniel, PharmD Clinical Pharmacist [facility] [phone] (direct line)  [phone] (main office number)   Chronic Care Management CARE PLAN (updated 10/10/2021)  Hypertension BP Readings from Last 3 Encounters:  07/20/21 138/68  03/06/21 (!) 158/80  07/18/20 132/74   Pharmacist Clinical Goal(s): Over the next 180 days, patient will work with PharmD and providers to maintain BP goal <140/90 Current regimen:  Hydrochlorothiazide 50m daily (patient takes at bedtime)  Lisinopril 281mdaily in the morning Verapamil 24041maily at bedtime Interventions:  Reviewed blood pressure goals and benefits of medications for prevention of heart attack, stroke and kidney damage Patient self care activities - Over the next 180 days, patient will: Maintain hypertension medication regimen.  Continue to stay active in garden and doing yardwork. Recommend resume walking or [employer] activities.  Hyperlipidemia Lab Results  Component Value Date/Time   LDLCALC 47 07/20/2021 11:12 AM   LDLDIRECT 133.6 06/26/2012 08:26 AM   Pharmacist Clinical Goal(s): Over the next 180 days, patient will work with PharmD and providers to maintain LDL goal < 100 Current regimen:  Atorvastatin 51m71mily Interventions:  Reviewed last lipid panel and reviewed cholesterol goals;  Reviewed importance of limiting foods high in cholesterol; Recommend continue current medications Patient self care activities - Over the  next 180 days, patient will: Maintain current cholesterol medication regimen.   Acid Reflux:  Pharmacist Clinical Goal(s) Over the next 180 days, patient will work with PharmD and providers to maintain control of acid reflux / heartburn Current regimen:  Omeprazole 51mg64mly as needed Interventions: none Patient self care activities - Over the next 180 days, patient will:  Continue to use omeprazole as needed  Medication management Pharmacist Clinical Goal(s): Over the next 90 days, patient will work with PharmD and providers to maintain optimal medication adherence [facility] she occasionally has pain in her knee. Usually is worse in morning, then resolves during the day. Does not currently limit her activity. Interventions Comprehensive medication review performed. Reviewed refill history and assessed adherence Continue current medication management strategy Recommended take acetaminophen 1000mg 48mo twice a day if needed for joint / knee pain.  Patient self care activities - Over the next 90 days, patient will: Focus on medication adherence by filling medications appropriately  Take medications as prescribed Report any questions or concerns to PharmD and/or provider(s) Take acetaminophen 1000mg u98m twice a day if needed for joint / knee pain.  Follow up with Dr Paz if Doris Mcdaniel continues of worsens.    Patient Goals/Self-Care Activities Over the next 180 days, patient will:  take medications as prescribed check blood pressure once a week, document, and provide at future appointments target a minimum of 150 minutes of moderate intensity exercise weekly Take acetaminophen 1000mg up39mtwice a day if needed for joint / knee pain.  Follow up with Dr Paz if Doris Mcdaniel continues of worsens.   Follow Up Plan: No follow up needed. Patient  has met all goals and has filled prescriptions on time over the last year; Closing out but patient is aware she can  contact clinical pharmacist if needed in future.     The patient verbalized understanding of instructions, educational materials, and care plan provided today and agreed to receive a mailed copy of patient instructions, educational materials, and care plan.

## 2021-10-10 NOTE — Chronic Care Management (AMB) (Signed)
Chronic Care Management Pharmacy Note  10/10/2021 Name:  Doris Mcdaniel MRN:  [id number] DOB:  [birth year]  Subjective: Doris Mcdaniel is an 86 y.o. year old female who is a primary patient of Paz, Alda Berthold, MD.  The CCM team was consulted for assistance with disease management and care coordination needs.    Engaged with patient by telephone for follow up visit in response to provider referral for pharmacy case management and/or care coordination services.   Consent to Services:  The patient was given information about Chronic Care Management services, agreed to services, and gave verbal consent prior to initiation of services.  Please see initial visit note for detailed documentation.   Patient Care Team: Colon Branch, MD as PCP - Catha Brow, MD as Consulting Physician (Ophthalmology)  Recent office visits: 07/20/2021 - Int Med (Dr Larose Kells) Complete physical. Received flu vaccine. Discussed fall prevention and encouraged vitamin D supplementation. F/U 1 year. 07/18/20- PCP (Dr Larose Kells) annual PE. No med changes. Received annual flu vaccine.  Recent consult visits: 06/23/2021 - Ophthalmology (Dr Ellie Lunch) Seen for eye exam.  Hospital visits: None in previous 6 months  Objective:  Lab Results  Component Value Date   CREATININE 1.40 (H) 07/20/2021   CREATININE 1.32 (H) 07/18/2020   CREATININE 1.34 (H) 07/16/2019    Lab Results  Component Value Date   HGBA1C 5.7 07/20/2021   Last diabetic Eye exam: No results found for: HMDIABEYEEXA  Last diabetic Foot exam: No results found for: HMDIABFOOTEX      Component Value Date/Time   CHOL 121 07/20/2021 1112   TRIG 131.0 07/20/2021 1112   HDL 48.00 07/20/2021 1112   CHOLHDL 3 07/20/2021 1112   VLDL 26.2 07/20/2021 1112   LDLCALC 47 07/20/2021 1112   LDLDIRECT 133.6 06/26/2012 0826    Hepatic Function Latest Ref Rng & Units 07/20/2021 07/18/2020 09/22/2019  Total Protein 6.0 - 8.3 g/dL 6.3 6.7 -  Albumin 3.5 -  5.2 g/dL 3.9 4.1 -  AST 0 - 37 U/L _0 ALT 0 - 35 U/L _1 Alk Phosphatase 39 - 117 U/L 61 60 -  Total Bilirubin 0.2 - 1.2 mg/dL 0.7 0.7 -  Bilirubin, Direct 0.0 - 0.3 mg/dL - - -    Lab Results  Component Value Date/Time   TSH 1.09 07/20/2021 11:12 AM   TSH 1.40 07/14/2018 11:05 AM    CBC Latest Ref Rng & Units 07/20/2021 07/18/2020 07/16/2019  WBC 4.0 - 10.5 K/uL 7.3 5.9 7.9  Hemoglobin 12.0 - 15.0 g/dL 11.9(L) 12.1 12.5  Hematocrit 36.0 - 46.0 % 36.1 36.3 38.9  Platelets 150.0 - 400.0 K/uL 211.0 205.0 288.0    Lab Results  Component Value Date/Time   VD25OH 45 07/12/2010 08:19 PM    Clinical ASCVD: No  The ASCVD Risk score (Arnett DK, et al., 2019) failed to calculate for the following reasons:   The 2019 ASCVD risk score is only valid for ages 3 to 27    Other: Last DEXA 2018 - normal   Social History   Tobacco Use  Smoking Status Former   Types: Cigarettes   Quit date: 07/08/1973   Years since quitting: 48.2  Smokeless Tobacco Never  Tobacco Comments   used to smoke 1 PPD   BP Readings from Last 3 Encounters:  07/20/21 138/68  03/06/21 (!) 158/80  07/18/20 132/74   Pulse Readings from Last 3 Encounters:  07/20/21 68  03/06/21 (!) 58  07/18/20 62   Wt Readings from Last 3 Encounters:  07/20/21 177 lb 2 oz (80.3 kg)  03/06/21 176 lb 9.6 oz (80.1 kg)  07/18/20 177 lb (80.3 kg)    Assessment: Review of patient past medical history, allergies, medications, health status, including review of consultants reports, laboratory and other test data, was performed as part of comprehensive evaluation and provision of chronic care management services.   SDOH:  (Social Determinants of Health) assessments and interventions performed:  SDOH Interventions    Flowsheet Row Most Recent Value  SDOH Interventions   Financial Strain Interventions Intervention Not Indicated  Physical Activity Interventions [employer]  Transportation Interventions Intervention  Not Indicated       CCM Care Plan  No Known Allergies  Medications Reviewed Today     Reviewed by Cherre Robins, RPH-CPP (Pharmacist) on 10/10/21 at 86  Med List Status: <None>   Medication Order Taking? Sig Documenting Provider Last Dose Status Informant  atorvastatin (LIPITOR) 20 MG tablet [id number] Yes TAKE 1 TABLET BY MOUTH AT BEDTIME Colon Branch, MD Taking Active   hydrochlorothiazide (HYDRODIURIL) 25 MG tablet [id number] Yes Take 1 tablet by mouth once daily Colon Branch, MD Taking Active   lisinopril (ZESTRIL) 20 MG tablet [id number] Yes Take 1 tablet by mouth once daily Colon Branch, MD Taking Active   loratadine (CLARITIN) 10 MG tablet [id number] Yes Take 1 tablet (10 mg total) by mouth daily. Colon Branch, MD Taking Active   Multiple Vitamin (MULTIVITAMIN) capsule [id number] Yes Take 1 capsule by mouth daily. [provider] Taking Active   verapamil (CALAN-SR) 240 MG CR tablet [id number] Yes TAKE 1 TABLET BY MOUTH ONCE DAILY AT BEDTIME Colon Branch, MD Taking Active             Patient Active Problem List   Diagnosis Date Noted   Gait disorder 07/19/2020   PCP NOTES >>>>> 07/05/2015   Depression 06/24/2012   History of breast cancer    Breast cancer, stage 0 03/08/2011   Annual physical exam 01/12/2011   DYSPNEA ON EXERTION 05/06/2009   Compression fracture of spine-- DEXA (-) 2014, declined meds  11/04/2008   Allergic rhinitis 11/03/2008   *GERD 09/23/2007   Joint pain 11/19/2006   Hyperlipidemia  09/17/2006   pcp notes---HTN 09/17/2006    Immunization History  Administered Date(s) Administered   Fluad Quad(high Dose 65+) 07/16/2019, 07/18/2020, 07/20/2021   Influenza Split 07/09/2011, 06/24/2012   Influenza Whole 05/05/2008, 05/06/2009, 07/12/2010   Influenza, High Dose Seasonal PF 06/30/2013, 07/05/2015, 07/09/2016, 07/11/2017, 07/14/2018   Influenza,inj,Quad PF,6+ Mos 07/02/2014   PFIZER(Purple Top)SARS-COV-2 Vaccination 09/17/2019, 10/07/2019,  06/25/2020   Pfizer Covid-19 Vaccine Bivalent Booster 42yrs & up 07/20/2021   Pneumococcal Conjugate-13 07/02/2014   Pneumococcal Polysaccharide-23 07/13/2006, 07/14/2018   Td 05/21/2007, 07/11/2017    Conditions to be addressed/monitored: HTN, HLD, and GERD; h/o breast cancer; allergies  Care Plan : General Pharmacy (Adult)  Updates made by Cherre Robins, RPH-CPP since 10/10/2021 12:00 AM     Problem: HTN, GERD, HLD, Allergic Rhinitis, Depression, Joint Pain, Skin lesion on face   Priority: High  Onset Date: 10/05/2020     Long-Range Goal: Provide education, support and care coordination for medication therapy and chronic conditions   Start Date: 10/05/2020  Expected End Date: 04/04/2021  Recent Progress: On track  Priority: High  Note:   Current Barriers:  Education and support for chronic conditions and medications  Pharmacist Clinical Goal(s):  Over the next 180  days, patient will achieve adherence to monitoring guidelines and medication adherence to achieve therapeutic efficacy maintain control of blood pressure as evidenced by home monitoring  contact provider office for questions/concerns as evidenced notation of same in electronic health record through collaboration with PharmD and provider.   Interventions: 1:1 collaboration with Colon Branch, MD regarding development and update of comprehensive plan of care as evidenced by provider attestation and co-signature Inter-disciplinary care team collaboration (see longitudinal plan of care) Comprehensive medication review performed; medication list updated in electronic medical record  Hypertension (BP goal <140/90) Controlled Current treatment: Hydrochlorothiazide 25mg  daily  Lisinopril 20mg  daily in the morning Verapamil 240mg  daily at bedtime Medications previously tried: none noted  Patient prefers to take HCTZ at night. States she would rather get up during night than have to run to bathroom during daytime.  Current  home readings: 135/52 today Current dietary habits:  Eats lots of vegetables Limits salt intake Beverages - mostly water or sweet tea; sometimes regular Pepsi Current exercise habits: very active with yardwork, gardening and house chores.  Has restarted walking at [facility] track 2 times per week for about 1 to 1.5 hours.  Denies hypotensive/hypertensive symptoms Interventions:  Reviewed blood pressure goals and benefits of medications for prevention of heart attack, stroke and kidney damage; Recommended she check blood pressure at home once a week, document, and provide log at future appointments Recommended to continue current medication  Continue to exercise regularly at [facility].   Hyperlipidemia: (LDL goal < 100) Controlled Current treatment: Atorvastatin 20mg  daily Medications previously tried: none noted  Interventions:  Reviewed last lipid panel and reviewed cholesterol goals;  Reviewed importance of limiting foods high in cholesterol; Recommend continue current medications  Allergic rhinitis (Goal: minimize symptoms) Controlled Current treatment  Loratadine 10mg  daily Medications previously tried: none noted States she has been on this for years with good relief of allergy symptoms Interventions:  Recommended to continue current medication  GERD:  Controlled Current treatment:  Omeprazole 20mg  daily as needed Report she has not taken omeprazole in the last month Rarely has reflux symptoms Food triggers - greasy food (which she tries to avoid)  Intervention:  Continue to use omeprazole as needed  Medication management Pharmacist Clinical Goal(s): Over the next 90 days, patient will work with PharmD and providers to maintain optimal medication adherence Current pharmacy: [facility] Patient mentions she occasionally has pain in her knee. Usually is worse in morning, then resolves during the day. Does not currently limit her activity. Interventions Comprehensive medication  review performed. Reviewed refill history and assessed adherence Continue current medication management strategy Recommended take acetaminophen 1000mg  up to twice a day if needed for joint / knee pain.  Patient self care activities - Over the next 90 days, patient will: Focus on medication adherence by filling medications appropriately  Take medications as prescribed Report any questions or concerns to PharmD and/or provider(s) Take acetaminophen 1000mg  up to twice a day if needed for joint / knee pain.  Follow up with Dr Larose Kells if pain continues of worsens.    Patient Goals/Self-Care Activities Over the next 180 days, patient will:  take medications as prescribed check blood pressure once a week, document, and provide at future appointments target a minimum of 150 minutes of moderate intensity exercise weekly Take acetaminophen 1000mg  up to twice a day if needed for joint / knee pain.  Follow up with Dr Larose Kells if pain continues of worsens.   Follow Up Plan: No follow up needed.  Patient has met all goals and has filled prescriptions on time over the last year; Closing out but patient is aware she can contact clinical pharmacist if needed in future.        Medication Assistance: None required.  Patient affirms current coverage meets needs.  Patient's preferred pharmacy is:  [place], [place] [address] [address] Phone: [phone] Fax: [phone]   Uses pill box? No - not needed Pt endorses 100% compliance   Plan: No further follow up required: Patient is meeting all Chronic Care Management goals  Cherre Robins, PharmD Clinical Pharmacist [facility] [place] [facility]

## 2022-03-08 ENCOUNTER — Ambulatory Visit: Payer: PPO

## 2022-03-13 NOTE — Progress Notes (Unsigned)
Subjective:   Doris Mcdaniel is a 86 y.o. female who presents for Medicare Annual (Subsequent) preventive examination.  Review of Systems     Cardiac Risk Factors include: advanced age (>36mn, >>72women);hypertension;dyslipidemia     Objective:    Today's Vitals   03/14/22 1017  BP: 116/62  Pulse: (!) 56  Resp: 16  Temp: 98.2 F (36.8 C)  SpO2: 100%  Weight: 176 lb 12.8 oz (80.2 kg)  Height: '5\' 6"'$  (1.676 m)   Body mass index is 28.54 kg/m.     03/14/2022   10:11 AM 03/06/2021   10:23 AM 03/04/2020   11:09 AM 03/03/2019   11:07 AM 02/17/2018   11:07 AM 01/17/2017   10:47 AM  Advanced Directives  Does Patient Have a Medical Advance Directive? Yes Yes Yes Yes Yes Yes  Type of AParamedicof AD[place]Living will H[place]Living will HAdwolfLiving will HAspen SpringsLiving will HCatawbaLiving will HSt. JamesLiving will  Does patient want to make changes to medical advance directive? No - Patient declined  No - Patient declined No - Patient declined No - Patient declined   Copy of [place]in Chart? Yes - validated most recent copy scanned in chart (See row information) No - copy requested No - copy requested Yes - validated most recent copy scanned in chart (See row information) No - copy requested No - copy requested    Current Medications (verified) Outpatient Encounter Medications as of 03/14/2022  Medication Sig   acetaminophen (TYLENOL) 500 MG tablet Take 2 tablets (1,000 mg total) by mouth 2 (two) times daily as needed for mild pain or moderate pain.   atorvastatin (LIPITOR) 20 MG tablet TAKE 1 TABLET BY MOUTH AT BEDTIME   hydrochlorothiazide (HYDRODIURIL) 25 MG tablet Take 1 tablet by mouth once daily   lisinopril (ZESTRIL) 20 MG tablet Take 1 tablet by mouth once daily   loratadine (CLARITIN) 10 MG tablet Take 1 tablet (10 mg total)  by mouth daily.   Multiple Vitamin (MULTIVITAMIN) capsule Take 1 capsule by mouth daily.   verapamil (CALAN-SR) 240 MG CR tablet TAKE 1 TABLET BY MOUTH ONCE DAILY AT BEDTIME   No facility-administered encounter medications on file as of 03/14/2022.    Allergies (verified) Patient has no known allergies.   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, age indertiminate    Allergic rhinitis    GERD (gastroesophageal reflux disease)    History of breast cancer 2012   left breast   Hyperlipidemia    Hypertension    Personal history of radiation therapy    Past Surgical History:  Procedure Laterality Date   ABDOMINAL HYSTERECTOMY     BREAST BIOPSY Left 01/24/2011   BREAST LUMPECTOMY Left 02/21/2011   BREAST SURGERY     lumpectomy (L)   CATARACT EXTRACTION     B   CHOLECYSTECTOMY  03/2008   OOPHORECTOMY     they left a part of the ovarly   Family History  Problem Relation Age of Onset   Heart disease Brother        CABG age ~ 89  Colon cancer Neg Hx    Breast cancer Neg Hx    Stroke Neg Hx    Hypertension Neg Hx    Diabetes Neg Hx  Social History   Socioeconomic History   Marital status: Married    Spouse name: Not on file   Number of children: 2   Years of education: Not on file   Highest education level: Not on file  Occupational History   Occupation: retired  Tobacco Use   Smoking status: Former    Types: Cigarettes    Quit date: 07/08/1973    Years since quitting: 48.7   Smokeless tobacco: Never   Tobacco comments:    used to smoke 1 PPD  Substance and Sexual Activity   Alcohol use: No   Drug use: No   Sexual activity: Not Currently  Other Topics Concern   Not on file  Social History Narrative   Lives w/ husband . 2 children live near by   Social Determinants of Health   Financial Resource Strain: Low Risk  (10/10/2021)   Overall Financial Resource Strain  (CARDIA)    Difficulty of Paying Living Expenses: Not hard at all  Food Insecurity: No Food Insecurity (03/06/2021)   Hunger Vital Sign    Worried About Running Out of Food in the Last Year: Never true    Ran Out of Food in the Last Year: Never true  Transportation Needs: No Transportation Needs (10/10/2021)   PRAPARE - Hydrologist (Medical): No    Lack of Transportation (Non-Medical): No  Physical Activity: Insufficiently Active (10/10/2021)   Exercise Vital Sign    Days of Exercise per Week: 2 days    Minutes of Exercise per Session: 70 min  Stress: No Stress Concern Present (03/06/2021)   [place]    Feeling of Stress : Not at all  Social Connections: Moderately Integrated (10/10/2021)   Social Connection and Isolation Panel [NHANES]    Frequency of Communication with Friends and Family: More than three times a week    Frequency of Social Gatherings with Friends and Family: Once a week    Attends Religious Services: Never    Marine scientist or Organizations: Yes    Attends Archivist Meetings: Never    Marital Status: Married    Tobacco Counseling Counseling given: Not Answered Tobacco comments: used to smoke 1 PPD   Clinical Intake:  Pre-visit preparation completed: Yes  Pain : No/denies pain     BMI - recorded: 28.54 Nutritional Status: BMI 25 -29 Overweight Nutritional Risks: None Diabetes: No  How often do you need to have someone help you when you read instructions, pamphlets, or other written materials from your doctor or pharmacy?: 1 - Never  Diabetic?no  Interpreter Needed?: No  Information entered by :: Yeilin Zweber   Activities of Daily Living    03/14/2022   10:20 AM  In your present state of health, do you have any difficulty performing the following activities:  Hearing? 0  Vision? 0  Difficulty concentrating or making decisions? 0   Walking or climbing stairs? 0  Dressing or bathing? 0  Doing errands, shopping? 0  Preparing Food and eating ? N  Using the Toilet? N  In the past six months, have you accidently leaked urine? N  Do you have problems with loss of bowel control? N  Managing your Medications? N  Managing your Finances? N  Housekeeping or managing your Housekeeping? N    Patient Care Team: Colon Branch, MD as PCP - General Luberta Mutter, MD as Consulting Physician (Ophthalmology)  Indicate any  recent Medical Services you may have received from other than [facility] providers in the past year (date may be approximate).     Assessment:   This is a routine wellness examination for Aneyah.  Hearing/Vision screen No results found.  Dietary issues and exercise activities discussed: Current Exercise Habits: Home exercise routine, Type of exercise: walking, Time (Minutes): 60, Frequency (Times/Week): 1, Weekly Exercise (Minutes/Week): 60, Intensity: Mild, Exercise limited by: None identified   Goals Addressed   None    Depression Screen    03/14/2022   10:18 AM 03/06/2021   10:25 AM 03/04/2020   11:15 AM 03/03/2019   11:08 AM 02/17/2018   11:08 AM 01/17/2017   10:46 AM 07/09/2016   10:41 AM  PHQ 2/9 Scores  PHQ - 2 Score 1 1 0 0 1 1 0    Fall Risk    03/14/2022   10:18 AM 03/06/2021   10:25 AM 03/08/2020    1:49 PM 03/04/2020   11:15 AM 03/03/2019   11:08 AM  Fall Risk   Falls in the past year? 0 0 0 0 0  Comment   Emmi Telephone Survey: data to providers prior to load    Number falls in past yr: 0 0  0   Injury with Fall? 0 0  0   Risk for fall due to : No Fall Risks      Follow up Falls evaluation completed Falls prevention discussed  Education provided;Falls prevention discussed     FALL RISK PREVENTION PERTAINING TO THE HOME:  Any stairs in or around the home? No  If so, are there any without handrails?  N/a Home free of loose throw rugs in walkways, pet beds, electrical cords, etc? Yes   Adequate lighting in your home to reduce risk of falls? Yes   ASSISTIVE DEVICES UTILIZED TO PREVENT FALLS:  Life alert? Yes  Use of a cane, walker or w/c? No  Grab bars in the bathroom? Yes  Shower chair or bench in shower? Yes  Elevated toilet seat or a handicapped toilet? Yes   TIMED UP AND GO:  Was the test performed? Yes .  Length of time to ambulate 10 feet: 10 sec.   Gait steady and fast without use of assistive device  Cognitive Function:    02/17/2018   11:16 AM 01/17/2017   10:57 AM  MMSE - Mini Mental State Exam  Orientation to time 5 5  Orientation to Place 5 5  Registration 3 3  Attention/ Calculation 5 5  Recall 2 2  Language- name 2 objects 2 2  Language- repeat 1 1  Language- follow 3 step command 3 3  Language- read & follow direction 1 1  Write a sentence 1 1  Copy design 1 0  Total score 29 28        03/14/2022   10:25 AM  6CIT Screen  What Year? 0 points  What month? 0 points  What time? 0 points  Count back from 20 0 points  Months in reverse 0 points  Repeat phrase 0 points  Total Score 0 points    Immunizations Immunization History  Administered Date(s) Administered   Fluad Quad(high Dose 65+) 07/16/2019, 07/18/2020, 07/20/2021   Influenza Split 07/09/2011, 06/24/2012   Influenza Whole 05/05/2008, 05/06/2009, 07/12/2010   Influenza, High Dose Seasonal PF 06/30/2013, 07/05/2015, 07/09/2016, 07/11/2017, 07/14/2018   Influenza,inj,Quad PF,6+ Mos 07/02/2014   PFIZER(Purple Top)SARS-COV-2 Vaccination 09/17/2019, 10/07/2019, 06/25/2020   Pfizer Covid-19 Vaccine Bivalent Booster 66yr &  up 07/20/2021   Pneumococcal Conjugate-13 07/02/2014   Pneumococcal Polysaccharide-23 07/13/2006, 07/14/2018   Td 05/21/2007, 07/11/2017    TDAP status: Up to date  Flu Vaccine status: Up to date  Pneumococcal vaccine status: Up to date  Covid-19 vaccine status: Completed vaccines  Qualifies for Shingles Vaccine? Yes   Zostavax completed No    Shingrix Completed?: No.    Education has been provided regarding the importance of this vaccine. Patient has been advised to call insurance company to determine out of pocket expense if they have not yet received this vaccine. Advised may also receive vaccine at local pharmacy or Health Dept. Verbalized acceptance and understanding.  Screening Tests Health Maintenance  Topic Date Due   Zoster Vaccines- Shingrix (1 of 2) Never done   INFLUENZA VACCINE  03/13/2022   TETANUS/TDAP  07/12/2027   Pneumonia Vaccine 46+ Years old  Completed   DEXA SCAN  Completed   COVID-19 Vaccine  Completed   HPV VACCINES  Aged Out    Health Maintenance  Health Maintenance Due  Topic Date Due   Zoster Vaccines- Shingrix (1 of 2) Never done   INFLUENZA VACCINE  03/13/2022    Colorectal cancer screening: No longer required.   Mammogram status: Ordered declined. Pt provided with contact info and advised to call to schedule appt.   Bone Density status: Ordered declined. Pt provided with contact info and advised to call to schedule appt.  Lung Cancer Screening: (Low Dose CT Chest recommended if Age 43-80 years, 30 pack-year currently smoking OR have quit w/in 15years.) does not qualify.   Lung Cancer Screening Referral: n/a  Additional Screening:  Hepatitis C Screening: does not qualify; Completed aged out  Vision Screening: Recommended annual ophthalmology exams for early detection of glaucoma and other disorders of the eye. Is the patient up to date with their annual eye exam?  No  Who is the provider or what is the name of the office in which the patient attends annual eye exams? N/a If pt is not established with a provider, would they like to be referred to a provider to establish care? No .   Dental Screening: Recommended annual dental exams for proper oral hygiene  Community Resource Referral / Chronic Care Management: CRR required this visit?  No   CCM required this visit?  No       Plan:     I have personally reviewed and noted the following in the patient's chart:   Medical and social history Use of alcohol, tobacco or illicit drugs  Current medications and supplements including opioid prescriptions.  Functional ability and status Nutritional status Physical activity Advanced directives List of other physicians Hospitalizations, surgeries, and ER visits in previous 12 months Vitals Screenings to include cognitive, depression, and falls Referrals and appointments  In addition, I have reviewed and discussed with patient certain preventive protocols, quality metrics, and best practice recommendations. A written personalized care plan for preventive services as well as general preventive health recommendations were provided to patient.      Duard Brady Jaz Mallick, [place]   03/14/2022   Nurse Notes: none   I have reviewed and agree with [facility] documentation.  Kathlene November, MD

## 2022-03-14 ENCOUNTER — Ambulatory Visit (INDEPENDENT_AMBULATORY_CARE_PROVIDER_SITE_OTHER): Payer: PPO

## 2022-03-14 VITALS — BP 116/62 | HR 56 | Temp 98.2°F | Resp 16 | Ht 66.0 in | Wt 176.8 lb

## 2022-03-14 DIAGNOSIS — Z0000 Encounter for general adult medical examination without abnormal findings: Secondary | ICD-10-CM

## 2022-03-14 NOTE — Patient Instructions (Signed)
Doris Mcdaniel , Thank you for taking time to come for your Medicare Wellness Visit. I appreciate your ongoing commitment to your health goals. Please review the following plan we discussed and let me know if I can assist you in the future.   Screening recommendations/referrals: Colonoscopy: no longer needed Mammogram: declined Bone Density: declined Recommended yearly ophthalmology/optometry visit for glaucoma screening and checkup Recommended yearly dental visit for hygiene and checkup  Vaccinations: Influenza vaccine: up to date Pneumococcal vaccine: up to date Tdap vaccine: up to date Shingles vaccine: declined   Covid-19:completed  Advanced directives: yes, on file   Conditions/risks identified: see problem list   Next appointment: Follow up in one year for your annual wellness visit    Preventive Care 84 Years and Older, Female Preventive care refers to lifestyle choices and visits with your health care provider that can promote health and wellness. What does preventive care include? A yearly physical exam. This is also called an annual well check. Dental exams once or twice a year. Routine eye exams. Ask your health care provider how often you should have your eyes checked. Personal lifestyle choices, including: Daily care of your teeth and gums. Regular physical activity. Eating a healthy diet. Avoiding tobacco and drug use. Limiting alcohol use. Practicing safe sex. Taking low-dose aspirin every day. Taking vitamin and mineral supplements as recommended by your health care provider. What happens during an annual well check? The services and screenings done by your health care provider during your annual well check will depend on your age, overall health, lifestyle risk factors, and family history of disease. Counseling  Your health care provider may ask you questions about your: Alcohol use. Tobacco use. Drug use. Emotional well-being. Home and relationship  well-being. Sexual activity. Eating habits. History of falls. Memory and ability to understand (cognition). Work and work Statistician. Reproductive health. Screening  You may have the following tests or measurements: Height, weight, and BMI. Blood pressure. Lipid and cholesterol levels. These may be checked every 5 years, or more frequently if you are over 84 years old. Skin check. Lung cancer screening. You may have this screening every year starting at age 84 if you have a 30-pack-year history of smoking and currently smoke or have quit within the past 15 years. Fecal occult blood test (FOBT) of the stool. You may have this test every year starting at age 84. Flexible sigmoidoscopy or colonoscopy. You may have a sigmoidoscopy every 5 years or a colonoscopy every 10 years starting at age 84. Hepatitis C blood test. Hepatitis B blood test. Sexually transmitted disease (STD) testing. Diabetes screening. This is done by checking your blood sugar (glucose) after you have not eaten for a while (fasting). You may have this done every 1-3 years. Bone density scan. This is done to screen for osteoporosis. You may have this done starting at age 84. Mammogram. This may be done every 1-2 years. Talk to your health care provider about how often you should have regular mammograms. Talk with your health care provider about your test results, treatment options, and if necessary, the need for more tests. Vaccines  Your health care provider may recommend certain vaccines, such as: Influenza vaccine. This is recommended every year. Tetanus, diphtheria, and acellular pertussis (Tdap, Td) vaccine. You may need a Td booster every 10 years. Zoster vaccine. You may need this after age 84. Pneumococcal 13-valent conjugate (PCV13) vaccine. One dose is recommended after age 84. Pneumococcal polysaccharide (PPSV23) vaccine. One dose is recommended after  age 84. Talk to your health care provider about which  screenings and vaccines you need and how often you need them. This information is not intended to replace advice given to you by your health care provider. Make sure you discuss any questions you have with your health care provider. Document Released: 08/26/2015 Document Revised: 04/18/2016 Document Reviewed: 05/31/2015 Elsevier Interactive Patient Education  2017 [place] Prevention in the Home Falls can cause injuries. They can happen to people of all ages. There are many things you can do to make your home safe and to help prevent falls. What can I do on the outside of my home? Regularly fix the edges of walkways and driveways and fix any cracks. Remove anything that might make you trip as you walk through a door, such as a raised step or threshold. Trim any bushes or trees on the path to your home. Use bright outdoor lighting. Clear any walking paths of anything that might make someone trip, such as rocks or tools. Regularly check to see if handrails are loose or broken. Make sure that both sides of any steps have handrails. Any raised decks and porches should have guardrails on the edges. Have any leaves, snow, or ice cleared regularly. Use sand or salt on walking paths during winter. Clean up any spills in your garage right away. This includes oil or grease spills. What can I do in the bathroom? Use night lights. Install grab bars by the toilet and in the tub and shower. Do not use towel bars as grab bars. Use non-skid mats or decals in the tub or shower. If you need to sit down in the shower, use a plastic, non-slip stool. Keep the floor dry. Clean up any water that spills on the floor as soon as it happens. Remove soap buildup in the tub or shower regularly. Attach bath mats securely with double-sided non-slip rug tape. Do not have throw rugs and other things on the floor that can make you trip. What can I do in the bedroom? Use night lights. Make sure that you have a  light by your bed that is easy to reach. Do not use any sheets or blankets that are too big for your bed. They should not hang down onto the floor. Have a firm chair that has side arms. You can use this for support while you get dressed. Do not have throw rugs and other things on the floor that can make you trip. What can I do in the kitchen? Clean up any spills right away. Avoid walking on wet floors. Keep items that you use a lot in easy-to-reach places. If you need to reach something above you, use a strong step stool that has a grab bar. Keep electrical cords out of the way. Do not use floor polish or wax that makes floors slippery. If you must use wax, use non-skid floor wax. Do not have throw rugs and other things on the floor that can make you trip. What can I do with my stairs? Do not leave any items on the stairs. Make sure that there are handrails on both sides of the stairs and use them. Fix handrails that are broken or loose. Make sure that handrails are as long as the stairways. Check any carpeting to make sure that it is firmly attached to the stairs. Fix any carpet that is loose or worn. Avoid having throw rugs at the top or bottom of the stairs. If you do  have throw rugs, attach them to the floor with carpet tape. Make sure that you have a light switch at the top of the stairs and the bottom of the stairs. If you do not have them, ask someone to add them for you. What else can I do to help prevent falls? Wear shoes that: Do not have high heels. Have rubber bottoms. Are comfortable and fit you well. Are closed at the toe. Do not wear sandals. If you use a stepladder: Make sure that it is fully opened. Do not climb a closed stepladder. Make sure that both sides of the stepladder are locked into place. Ask someone to hold it for you, if possible. Clearly mark and make sure that you can see: Any grab bars or handrails. First and last steps. Where the edge of each step  is. Use tools that help you move around (mobility aids) if they are needed. These include: Canes. Walkers. Scooters. Crutches. Turn on the lights when you go into a dark area. Replace any light bulbs as soon as they burn out. Set up your furniture so you have a clear path. Avoid moving your furniture around. If any of your floors are uneven, fix them. If there are any pets around you, be aware of where they are. Review your medicines with your doctor. Some medicines can make you feel dizzy. This can increase your chance of falling. Ask your doctor what other things that you can do to help prevent falls. This information is not intended to replace advice given to you by your health care provider. Make sure you discuss any questions you have with your health care provider. Document Released: 05/26/2009 Document Revised: 01/05/2016 Document Reviewed: 09/03/2014 Elsevier Interactive Patient Education  2017 [employer].

## 2022-07-04 DIAGNOSIS — Z961 Presence of intraocular lens: Secondary | ICD-10-CM | POA: Diagnosis not present

## 2022-07-04 DIAGNOSIS — H52203 Unspecified astigmatism, bilateral: Secondary | ICD-10-CM | POA: Diagnosis not present

## 2022-07-04 DIAGNOSIS — H26492 Other secondary cataract, left eye: Secondary | ICD-10-CM | POA: Diagnosis not present

## 2022-07-04 DIAGNOSIS — H353211 Exudative age-related macular degeneration, right eye, with active choroidal neovascularization: Secondary | ICD-10-CM | POA: Diagnosis not present

## 2022-07-04 DIAGNOSIS — H353121 Nonexudative age-related macular degeneration, left eye, early dry stage: Secondary | ICD-10-CM | POA: Diagnosis not present

## 2022-07-10 DIAGNOSIS — H43813 Vitreous degeneration, bilateral: Secondary | ICD-10-CM | POA: Diagnosis not present

## 2022-07-10 DIAGNOSIS — H35033 Hypertensive retinopathy, bilateral: Secondary | ICD-10-CM | POA: Diagnosis not present

## 2022-07-10 DIAGNOSIS — H353211 Exudative age-related macular degeneration, right eye, with active choroidal neovascularization: Secondary | ICD-10-CM | POA: Diagnosis not present

## 2022-07-10 DIAGNOSIS — H353122 Nonexudative age-related macular degeneration, left eye, intermediate dry stage: Secondary | ICD-10-CM | POA: Diagnosis not present

## 2022-07-17 DIAGNOSIS — H353211 Exudative age-related macular degeneration, right eye, with active choroidal neovascularization: Secondary | ICD-10-CM | POA: Diagnosis not present

## 2022-07-23 ENCOUNTER — Ambulatory Visit (INDEPENDENT_AMBULATORY_CARE_PROVIDER_SITE_OTHER): Payer: PPO | Admitting: Internal Medicine

## 2022-07-23 ENCOUNTER — Encounter: Payer: Self-pay | Admitting: Internal Medicine

## 2022-07-23 ENCOUNTER — Telehealth: Payer: Self-pay | Admitting: Internal Medicine

## 2022-07-23 VITALS — BP 126/68 | HR 52 | Temp 97.9°F | Resp 18 | Ht 66.0 in | Wt 180.1 lb

## 2022-07-23 DIAGNOSIS — R739 Hyperglycemia, unspecified: Secondary | ICD-10-CM | POA: Diagnosis not present

## 2022-07-23 DIAGNOSIS — Z23 Encounter for immunization: Secondary | ICD-10-CM | POA: Diagnosis not present

## 2022-07-23 DIAGNOSIS — I10 Essential (primary) hypertension: Secondary | ICD-10-CM | POA: Diagnosis not present

## 2022-07-23 DIAGNOSIS — N189 Chronic kidney disease, unspecified: Secondary | ICD-10-CM | POA: Diagnosis not present

## 2022-07-23 DIAGNOSIS — Z0000 Encounter for general adult medical examination without abnormal findings: Secondary | ICD-10-CM

## 2022-07-23 DIAGNOSIS — E785 Hyperlipidemia, unspecified: Secondary | ICD-10-CM | POA: Diagnosis not present

## 2022-07-23 DIAGNOSIS — D649 Anemia, unspecified: Secondary | ICD-10-CM

## 2022-07-23 DIAGNOSIS — Z780 Asymptomatic menopausal state: Secondary | ICD-10-CM | POA: Diagnosis not present

## 2022-07-23 LAB — CBC WITH DIFFERENTIAL/PLATELET
Basophils Absolute: 0.1 10*3/uL (ref 0.0–0.1)
Basophils Relative: 1 % (ref 0.0–3.0)
Eosinophils Absolute: 0.2 10*3/uL (ref 0.0–0.7)
Eosinophils Relative: 2.9 % (ref 0.0–5.0)
HCT: 33.6 % — ABNORMAL LOW (ref 36.0–46.0)
Hemoglobin: 11.5 g/dL — ABNORMAL LOW (ref 12.0–15.0)
Lymphocytes Relative: 21.7 % (ref 12.0–46.0)
Lymphs Abs: 1.3 10*3/uL (ref 0.7–4.0)
MCHC: 34.3 g/dL (ref 30.0–36.0)
MCV: 88.9 fl (ref 78.0–100.0)
Monocytes Absolute: 0.6 10*3/uL (ref 0.1–1.0)
Monocytes Relative: 9.9 % (ref 3.0–12.0)
Neutro Abs: 4 10*3/uL (ref 1.4–7.7)
Neutrophils Relative %: 64.5 % (ref 43.0–77.0)
Platelets: 208 10*3/uL (ref 150.0–400.0)
RBC: 3.78 Mil/uL — ABNORMAL LOW (ref 3.87–5.11)
RDW: 13.6 % (ref 11.5–15.5)
WBC: 6.2 10*3/uL (ref 4.0–10.5)

## 2022-07-23 LAB — COMPREHENSIVE METABOLIC PANEL
ALT: 8 U/L (ref 0–35)
AST: 13 U/L (ref 0–37)
Albumin: 4 g/dL (ref 3.5–5.2)
Alkaline Phosphatase: 82 U/L (ref 39–117)
BUN: 26 mg/dL — ABNORMAL HIGH (ref 6–23)
CO2: 29 mEq/L (ref 19–32)
Calcium: 8.9 mg/dL (ref 8.4–10.5)
Chloride: 103 mEq/L (ref 96–112)
Creatinine, Ser: 1.55 mg/dL — ABNORMAL HIGH (ref 0.40–1.20)
GFR: 30.69 mL/min — ABNORMAL LOW (ref >60.00)
Glucose, Bld: 102 mg/dL — ABNORMAL HIGH (ref 70–99)
Potassium: 4.1 mEq/L (ref 3.5–5.1)
Sodium: 141 mEq/L (ref 135–145)
Total Bilirubin: 0.8 mg/dL (ref 0.2–1.2)
Total Protein: 6.5 g/dL (ref 6.0–8.3)

## 2022-07-23 LAB — LIPID PANEL
Cholesterol: 113 mg/dL (ref 0–200)
HDL: 49.8 mg/dL (ref >39.00)
LDL Cholesterol: 42 mg/dL (ref 0–99)
NonHDL: 63.29
Total CHOL/HDL Ratio: 2
Triglycerides: 107 mg/dL (ref 0.0–149.0)
VLDL: 21.4 mg/dL (ref 0.0–40.0)

## 2022-07-23 LAB — IRON: Iron: 94 ug/dL (ref 42–145)

## 2022-07-23 LAB — FERRITIN: Ferritin: 186.6 ng/mL (ref 10.0–291.0)

## 2022-07-23 MED ORDER — VERAPAMIL HCL ER 240 MG PO TBCR: 240.0000 mg | EXTENDED_RELEASE_TABLET | Freq: Every day | ORAL | 3 refills | Status: DC

## 2022-07-23 MED ORDER — ATORVASTATIN CALCIUM 20 MG PO TABS: 20.0000 mg | ORAL_TABLET | Freq: Every day | ORAL | 3 refills | Status: DC

## 2022-07-23 MED ORDER — LISINOPRIL 20 MG PO TABS: 20.0000 mg | ORAL_TABLET | Freq: Every day | ORAL | 3 refills | Status: DC

## 2022-07-23 MED ORDER — HYDROCHLOROTHIAZIDE 25 MG PO TABS: 25.0000 mg | ORAL_TABLET | Freq: Every day | ORAL | 3 refills | Status: DC

## 2022-07-23 NOTE — Telephone Encounter (Signed)
Rxs sent

## 2022-07-23 NOTE — Assessment & Plan Note (Signed)
Here for CPX HTN: BP today is okay, no ambulatory BPs, encouraged to check BPs at home.  Continue HCTZ, lisinopril, verapamil.  Check labs. 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  Encourage consistent use of a cane.  Fall prevention discussed. Mild anemia: Check iron and ferritin.  Denies GI symptoms Macular degeneration?  Recently diagnosed with an eye condition, getting eye injections. RTC 1 year

## 2022-07-23 NOTE — Patient Instructions (Addendum)
Check the  blood pressure regularly BP GOAL is between 110/65 and  135/85. If it is consistently higher or lower, let me know    GO TO THE LAB : Get the blood work     Collins, Moran back for a physical exam in 1 year    Fall Prevention in the Home, Adult  Falls can cause injuries and affect people of all ages. There are many simple things that you can do to make your home safe and to help prevent falls. Ask for help when making these changes, if needed. What actions can I take to prevent falls? General instructions Use good lighting in all rooms. Replace any light bulbs that burn out, turn on lights if it is dark, and use night-lights. Place frequently used items in easy-to-reach places. Lower the shelves around your home if necessary. Set up furniture so that there are clear paths around it. Avoid moving your furniture around. Remove throw rugs and other tripping hazards from the floor. Avoid walking on wet floors. Fix any uneven floor surfaces. Add color or contrast paint or tape to grab bars and handrails in your home. Place contrasting color strips on the first and last steps of staircases. When you use a stepladder, make sure that it is completely opened and that the sides and supports are firmly locked. Have someone hold the ladder while you are using it. Do not climb a closed stepladder. Know where your pets are when moving through your home. What can I do in the bathroom?     Keep the floor dry. Immediately clean up any water that is on the floor. Remove soap buildup in the tub or shower regularly. Use nonskid mats or decals on the floor of the tub or shower. Attach bath mats securely with double-sided, nonslip rug tape. If you need to sit down while you are in the shower, use a plastic, nonslip stool. Install grab bars by the toilet and in the tub and shower. Do not use towel bars as grab bars. What can I do in the  bedroom? Make sure that a bedside light is easy to reach. Do not use oversized bedding that reaches the floor. Have a firm chair that has side arms to use for getting dressed. What can I do in the kitchen? Clean up any spills right away. If you need to reach for something above you, use a sturdy step stool that has a grab bar. Keep electrical cables out of the way. Do not use floor polish or wax that makes floors slippery. If you must use wax, make sure that it is non-skid floor wax. What can I do with my stairs? Do not leave any items on the stairs. Make sure that you have a light switch at the top and the bottom of the stairs. Have them installed if you do not have them. Make sure that there are handrails on both sides of the stairs. Fix handrails that are broken or loose. Make sure that handrails are as long as the staircases. Install non-slip stair treads on all stairs in your home. Avoid having throw rugs at the top or bottom of stairs, or secure the rugs with carpet tape to prevent them from moving. Choose a carpet design that does not hide the edge of steps on the stairs. Check any carpeting to make sure that it is firmly attached to the stairs. Fix any carpet that is loose or worn.  What can I do on the outside of my home? Use bright outdoor lighting. Regularly repair the edges of walkways and driveways and fix any cracks. Remove high doorway thresholds. Trim any shrubbery on the main path into your home. Regularly check that handrails are securely fastened and in good repair. Both sides of all steps should have handrails. Install guardrails along the edges of any raised decks or porches. Clear walkways of debris and clutter, including tools and rocks. Have leaves, snow, and ice cleared regularly. Use sand or salt on walkways during winter months. In the garage, clean up any spills right away, including grease or oil spills. What other actions can I take? Wear closed-toe shoes  that fit well and support your feet. Wear shoes that have rubber soles or low heels. Use mobility aids as needed, such as canes, walkers, scooters, and crutches. Review your medicines with your health care provider. Some medicines can cause dizziness or changes in blood pressure, which increase your risk of falling. Talk with your health care provider about other ways that you can decrease your risk of falls. This may include working with a physical therapist or trainer to improve your strength, balance, and endurance. Where to find more information Centers for Disease Control and Prevention, [employer]: http://www.wolf.info/ National Institute on Aging: http://kim-miller.com/ Contact a health care provider if: You are afraid of falling at home. You feel weak, drowsy, or dizzy at home. You fall at home. Summary There are many simple things that you can do to make your home safe and to help prevent falls. Ways to make your home safe include removing tripping hazards and installing grab bars in the bathroom. Ask for help when making these changes in your home. This information is not intended to replace advice given to you by your health care provider. Make sure you discuss any questions you have with your health care provider. Document Revised: 05/01/2021 Document Reviewed: 03/02/2020 Elsevier Patient Education  [place].

## 2022-07-23 NOTE — Progress Notes (Signed)
Subjective:    Patient ID: Doris Mcdaniel, female    DOB: 1935/12/29, 86 y.o.   MRN: [id number]  DOS:  07/23/2022 Type of visit - description: CPX  Here for CPX In general feels well. Has noted her stamina is not as it used to be but denies chest pain or DOE. Transferring has become a little more difficult over the years. No falls.  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, age indertiminate    Allergic rhinitis    GERD (gastroesophageal reflux disease)    History of breast cancer 2012   left breast   Hyperlipidemia    Hypertension    Personal history of radiation therapy     Past Surgical History:  Procedure Laterality Date   ABDOMINAL HYSTERECTOMY     BREAST BIOPSY Left 01/24/2011   BREAST LUMPECTOMY Left 02/21/2011   BREAST SURGERY     lumpectomy (L)   CATARACT EXTRACTION     B   CHOLECYSTECTOMY  03/2008   OOPHORECTOMY     they left a part of the ovarly   Social History   Socioeconomic History   Marital status: Married    Spouse name: Not on file   Number of children: 2   Years of education: Not on file   Highest education level: Not on file  Occupational History   Occupation: retired  Tobacco Use   Smoking status: Former    Types: Cigarettes    Quit date: 07/08/1973    Years since quitting: 49.0   Smokeless tobacco: Never   Tobacco comments:    used to smoke 1 PPD  Substance and Sexual Activity   Alcohol use: No   Drug use: No   Sexual activity: Not Currently  Other Topics Concern   Not on file  Social History Narrative   Lives w/ husband . 2 children live near by   Social Determinants of Health   Financial Resource Strain: Low Risk  (10/10/2021)   Overall Financial Resource Strain (CARDIA)    Difficulty of Paying Living Expenses: Not hard at all  Food Insecurity: No  Food Insecurity (03/06/2021)   Hunger Vital Sign    Worried About Running Out of Food in the Last Year: Never true    Ran Out of Food in the Last Year: Never true  Transportation Needs: No Transportation Needs (10/10/2021)   PRAPARE - Hydrologist (Medical): No    Lack of Transportation (Non-Medical): No  Physical Activity: Insufficiently Active (10/10/2021)   Exercise Vital Sign    Days of Exercise per Week: 2 days    Minutes of Exercise per Session: 70 min  Stress: No Stress Concern Present (03/06/2021)   [place]    Feeling of Stress : Not at all  Social Connections: Moderately Integrated (10/10/2021)   Social Connection and Isolation Panel [NHANES]    Frequency of Communication with Friends and Family: More than three times a week    Frequency of Social Gatherings with Friends and Family: Once a week    Attends Religious Services: Never    Marine scientist or Organizations: Yes    Attends Archivist Meetings: Never    Marital Status: Married  Intimate Partner Violence: Not At Risk (03/06/2021)   Humiliation, Afraid, Rape, and Kick questionnaire    Fear of Current or Ex-Partner: No    Emotionally Abused: No    Physically Abused: No    Sexually Abused: No     Current Outpatient Medications  Medication Instructions   acetaminophen (TYLENOL) 1,000 mg, Oral, 2 times daily PRN   atorvastatin (LIPITOR) 20 mg, Oral, Daily at bedtime   carboxymethylcellulose (REFRESH PLUS) 0.5 % SOLN 1 drop, Both Eyes, Daily PRN   hydrochlorothiazide (HYDRODIURIL) 25 mg, Oral, Daily   lisinopril (ZESTRIL) 20 mg, Oral, Daily   loratadine (CLARITIN) 10 mg, Oral, Daily   Multiple Vitamin (MULTIVITAMIN) capsule 1 capsule, Oral, Daily,     verapamil (CALAN-SR) 240 mg, Oral, Daily at bedtime       Objective:   Physical Exam BP 126/68   Pulse (!) 52   Temp 97.9 F (36.6 C) (Oral)   Resp 18    Ht '5\' 6"'$  (1.676 m)   Wt 180 lb 2 oz (81.7 kg)   SpO2 97%   BMI 29.07 kg/m  General: Well developed, NAD, BMI noted Neck: No  thyromegaly  HEENT:  Normocephalic . Face symmetric, atraumatic Lungs:  CTA B Normal respiratory effort, no intercostal retractions, no accessory muscle use. Heart: RRR,  no murmur.  Abdomen:  Not distended, soft, non-tender. No rebound or rigidity.   Lower extremities: no pretibial edema bilaterally  Skin: Exposed areas without rash. Not pale. Not jaundice Neurologic:  alert & oriented X3.  Speech normal, gait appropriate for age and unassisted.  Transferring: Needs some help. Strength symmetric and appropriate for age.  Psych: Cognition and judgment appear intact.  Cooperative with normal attention span and concentration.  Behavior appropriate. No anxious or depressed appearing.     Assessment     Assessment  HTN Hyperlipidemia GERD DJD  Breast cancer, 2012, left, lumpectomy, XRT. Does not see oncology regularly  MSK: --T8  mild compression per x-ray, declined Rx  --DEXA 2014 and  2018: Normal. Vitamin D normal when checked  PLAN  Here for CPX HTN: BP today is okay, no ambulatory BPs, encouraged to check BPs at home.  Continue HCTZ, lisinopril, verapamil.  Check labs. 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  Encourage consistent use of a cane.  Fall prevention discussed. Mild anemia: Check iron and ferritin.  Denies GI symptoms Macular degeneration?  Recently diagnosed with an eye condition, getting eye injections. RTC 1 year

## 2022-07-23 NOTE — Telephone Encounter (Signed)
Pt thought she was getting her medications renewed today at her appt. Please advise.

## 2022-07-23 NOTE — Assessment & Plan Note (Addendum)
-  Td [id number]  - pnm 23: 2007 and 07/2018; prevnar: 2015.  PNM 20: 07/23/2022 - rec  shingrix : declines  - covid vax booster: Declines - RSV recommended - flu shot today -No furthert PAPs, see previous entries  -H/o breast ca, declines further screening.  See previous notes. -CCS: last colonoscopy 06/2009, negative.   No further screening. -Labs:  CMP FLP CBC iron ferritin - Bones: Previous DEXAs normal, history of mild T8 compression fraction.  Recheck DEXA -Diet and exercise discussed  -Fall prevention discussed.    -Healthcare POA: On file

## 2022-07-30 NOTE — Addendum Note (Signed)
Addended byDamita Dunnings D on: 07/30/2022 09:41 AM   Modules accepted: Orders

## 2022-08-14 ENCOUNTER — Ambulatory Visit (HOSPITAL_BASED_OUTPATIENT_CLINIC_OR_DEPARTMENT_OTHER)
Admission: RE | Admit: 2022-08-14 | Discharge: 2022-08-14 | Disposition: A | Discharge status: Home / Self Care | Payer: PPO | Source: Ambulatory Visit | Attending: Internal Medicine | Admitting: Internal Medicine

## 2022-08-14 DIAGNOSIS — N281 Cyst of kidney, acquired: Secondary | ICD-10-CM | POA: Diagnosis not present

## 2022-08-14 DIAGNOSIS — N189 Chronic kidney disease, unspecified: Secondary | ICD-10-CM | POA: Insufficient documentation

## 2022-08-17 DIAGNOSIS — H353211 Exudative age-related macular degeneration, right eye, with active choroidal neovascularization: Secondary | ICD-10-CM | POA: Diagnosis not present

## 2022-09-18 DIAGNOSIS — H353211 Exudative age-related macular degeneration, right eye, with active choroidal neovascularization: Secondary | ICD-10-CM | POA: Diagnosis not present

## 2022-10-19 DIAGNOSIS — H353122 Nonexudative age-related macular degeneration, left eye, intermediate dry stage: Secondary | ICD-10-CM | POA: Diagnosis not present

## 2022-10-19 DIAGNOSIS — H353211 Exudative age-related macular degeneration, right eye, with active choroidal neovascularization: Secondary | ICD-10-CM | POA: Diagnosis not present

## 2022-10-19 DIAGNOSIS — H43813 Vitreous degeneration, bilateral: Secondary | ICD-10-CM | POA: Diagnosis not present

## 2022-10-19 DIAGNOSIS — H35033 Hypertensive retinopathy, bilateral: Secondary | ICD-10-CM | POA: Diagnosis not present

## 2022-12-03 DIAGNOSIS — H353211 Exudative age-related macular degeneration, right eye, with active choroidal neovascularization: Secondary | ICD-10-CM | POA: Diagnosis not present

## 2023-01-21 DIAGNOSIS — H353211 Exudative age-related macular degeneration, right eye, with active choroidal neovascularization: Secondary | ICD-10-CM | POA: Diagnosis not present

## 2023-03-19 ENCOUNTER — Ambulatory Visit (INDEPENDENT_AMBULATORY_CARE_PROVIDER_SITE_OTHER): Payer: PPO | Admitting: *Deleted

## 2023-03-19 VITALS — BP 131/70 | HR 71 | Ht 66.0 in | Wt 183.4 lb

## 2023-03-19 DIAGNOSIS — Z0000 Encounter for general adult medical examination without abnormal findings: Secondary | ICD-10-CM

## 2023-03-19 NOTE — Progress Notes (Signed)
Subjective:   Doris Mcdaniel is a 87 y.o. female who presents for Medicare Annual (Subsequent) preventive examination.  Visit Complete: In person  Review of Systems     Cardiac Risk Factors include: advanced age (>53men, >101 women);dyslipidemia;hypertension     Objective:    Today's Vitals   03/19/23 1006  BP: 131/70  Pulse: 71  Weight: 183 lb 6.4 oz (83.2 kg)  Height: 5\' 6"  (1.676 m)   Body mass index is 29.6 kg/m.     03/19/2023   10:02 AM 03/14/2022   10:11 AM 03/06/2021   10:23 AM 03/04/2020   11:09 AM 03/03/2019   11:07 AM 02/17/2018   11:07 AM 01/17/2017   10:47 AM  Advanced Directives  Does Patient Have a Medical Advance Directive? Yes Yes Yes Yes Yes Yes Yes  Type of Estate agent of [place];Living will Healthcare Power of [place];Living will Healthcare Power of [place];Living will Healthcare Power of [place];Living will Healthcare Power of [place];Living will Healthcare Power of [place];Living will Healthcare Power of [place];Living will  Does patient want to make changes to medical advance directive? No - Patient declined No - Patient declined  No - Patient declined No - Patient declined No - Patient declined   Copy of Healthcare Power of Attorney in Chart? Yes - validated most recent copy scanned in chart (See row information) Yes - validated most recent copy scanned in chart (See row information) No - copy requested No - copy requested Yes - validated most recent copy scanned in chart (See row information) No - copy requested No - copy requested    Current Medications (verified) Outpatient Encounter Medications as of 03/19/2023  Medication Sig   acetaminophen (TYLENOL) 500 MG tablet Take 2 tablets (1,000 mg total) by mouth 2 (two) times daily as needed for mild pain or moderate pain.   atorvastatin (LIPITOR) 20 MG tablet Take 1 tablet (20 mg total) by mouth at bedtime.   CALCIUM-VITAMIN D PO Take by mouth.   carboxymethylcellulose  (REFRESH PLUS) 0.5 % SOLN Place 1 drop into both eyes daily as needed.   hydrochlorothiazide (HYDRODIURIL) 25 MG tablet Take 1 tablet (25 mg total) by mouth daily.   lisinopril (ZESTRIL) 20 MG tablet Take 1 tablet (20 mg total) by mouth daily.   loratadine (CLARITIN) 10 MG tablet Take 1 tablet (10 mg total) by mouth daily.   Multiple Vitamin (MULTIVITAMIN) capsule Take 1 capsule by mouth daily.   Multiple Vitamins-Minerals (PRESERVISION AREDS PO) Take by mouth.   verapamil (CALAN-SR) 240 MG CR tablet Take 1 tablet (240 mg total) by mouth at bedtime.   No facility-administered encounter medications on file as of 03/19/2023.    Allergies (verified) Patient has no known allergies.   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, age indertiminate    Allergic rhinitis    GERD (gastroesophageal reflux disease)    History of breast cancer 2012   left breast   Hyperlipidemia    Hypertension    Personal history of radiation therapy    Past Surgical History:  Procedure Laterality Date   ABDOMINAL HYSTERECTOMY     BREAST BIOPSY Left 01/24/2011   BREAST LUMPECTOMY Left 02/21/2011   BREAST SURGERY     lumpectomy (L)   CATARACT EXTRACTION     B   CHOLECYSTECTOMY  03/2008   OOPHORECTOMY     they left a part  of the ovarly   Family History  Problem Relation Age of Onset   Heart disease Brother        CABG age ~ 72   Colon cancer Neg Hx    Breast cancer Neg Hx    Stroke Neg Hx    Hypertension Neg Hx    Diabetes Neg Hx    Social History   Socioeconomic History   Marital status: Married    Spouse name: Not on file   Number of children: 2   Years of education: Not on file   Highest education level: Not on file  Occupational History   Occupation: retired  Tobacco Use   Smoking status: Former    Current packs/day: 0.00    Types: Cigarettes    Quit date: 07/08/1973    Years since quitting:  49.7   Smokeless tobacco: Never   Tobacco comments:    used to smoke 1 PPD  Substance and Sexual Activity   Alcohol use: No   Drug use: No   Sexual activity: Not Currently  Other Topics Concern   Not on file  Social History Narrative   Lives w/ husband . 2 children live near by   Social Determinants of Health   Financial Resource Strain: Low Risk  (03/19/2023)   Overall Financial Resource Strain (CARDIA)    Difficulty of Paying Living Expenses: Not hard at all  Food Insecurity: No Food Insecurity (03/19/2023)   Hunger Vital Sign    Worried About Running Out of Food in the Last Year: Never true    Ran Out of Food in the Last Year: Never true  Transportation Needs: No Transportation Needs (03/19/2023)   PRAPARE - Administrator, Civil Service (Medical): No    Lack of Transportation (Non-Medical): No  Physical Activity: Inactive (03/19/2023)   Exercise Vital Sign    Days of Exercise per Week: 0 days    Minutes of Exercise per Session: 0 min  Stress: No Stress Concern Present (03/19/2023)   [employer] of Occupational Health - Occupational Stress Questionnaire    Feeling of Stress : Not at all  Social Connections: Moderately Isolated (03/19/2023)   Social Connection and Isolation Panel [NHANES]    Frequency of Communication with Friends and Family: Three times a week    Frequency of Social Gatherings with Friends and Family: Never    Attends Religious Services: Never    Database administrator or Organizations: No    Attends Engineer, structural: Never    Marital Status: Married    Tobacco Counseling Counseling given: Not Answered Tobacco comments: used to smoke 1 PPD   Clinical Intake:  Pre-visit preparation completed: Yes  Pain : No/denies pain  BMI - recorded: 29.6 Nutritional Status: BMI 25 -29 Overweight Nutritional Risks: None Diabetes: No  How often do you need to have someone help you when you read instructions, pamphlets, or other written  materials from your doctor or pharmacy?: 1 - Never  Interpreter Needed?: No  Information entered by :: Donne Anon, CMA   Activities of Daily Living    03/19/2023   10:09 AM  In your present state of health, do you have any difficulty performing the following activities:  Hearing? 0  Vision? 0  Difficulty concentrating or making decisions? 0  Walking or climbing stairs? 1  Dressing or bathing? 0  Doing errands, shopping? 0  Preparing Food and eating ? N  Using the Toilet? N  In the past  six months, have you accidently leaked urine? Y  Do you have problems with loss of bowel control? N  Managing your Medications? N  Managing your Finances? N  Housekeeping or managing your Housekeeping? N    Patient Care Team: Wanda Plump, MD as PCP - General Maris Berger, MD as Consulting Physician (Ophthalmology)  Indicate any recent Medical Services you may have received from other than [facility] providers in the past year (date may be approximate).     Assessment:   This is a routine wellness examination for Aaishah.  Hearing/Vision screen No results found.  Dietary issues and exercise activities discussed:     Goals Addressed   None    Depression Screen    03/19/2023   10:20 AM 07/23/2022   10:04 AM 03/14/2022   10:18 AM 03/06/2021   10:25 AM 03/04/2020   11:15 AM 03/03/2019   11:08 AM 02/17/2018   11:08 AM  PHQ 2/9 Scores  PHQ - 2 Score 0 0 1 1 0 0 1  PHQ- 9 Score  0         Fall Risk    03/19/2023   10:20 AM 07/23/2022   10:03 AM 03/14/2022   10:18 AM 03/06/2021   10:25 AM 03/08/2020    1:49 PM  Fall Risk   Falls in the past year? 0 0 0 0 0  Comment     Emmi Telephone Survey: data to providers prior to load  Number falls in past yr: 0 0 0 0   Injury with Fall? 0 0 0 0   Risk for fall due to : No Fall Risks  No Fall Risks    Follow up Falls evaluation completed Falls evaluation completed Falls evaluation completed Falls prevention discussed     MEDICARE RISK AT HOME:   Medicare Risk at Home - 03/19/23 1014     Any stairs in or around the home? Yes    If so, are there any without handrails? No    Home free of loose throw rugs in walkways, pet beds, electrical cords, etc? Yes    Adequate lighting in your home to reduce risk of falls? Yes    Life alert? No    Use of a cane, walker or w/c? No    Grab bars in the bathroom? Yes    Shower chair or bench in shower? No    Elevated toilet seat or a handicapped toilet? Yes   comfort height            TIMED UP AND GO:  Was the test performed?  Yes  Length of time to ambulate 10 feet: 7 sec Gait steady and fast without use of assistive device    Cognitive Function:    02/17/2018   11:16 AM 01/17/2017   10:57 AM  MMSE - Mini Mental State Exam  Orientation to time 5 5  Orientation to Place 5 5  Registration 3 3  Attention/ Calculation 5 5  Recall 2 2  Language- name 2 objects 2 2  Language- repeat 1 1  Language- follow 3 step command 3 3  Language- read & follow direction 1 1  Write a sentence 1 1  Copy design 1 0  Total score 29 28        03/19/2023   10:23 AM 03/14/2022   10:25 AM  6CIT Screen  What Year? 0 points 0 points  What month? 0 points 0 points  What time? 0 points 0 points  Count back from 20 0 points 0 points  Months in reverse 0 points 0 points  Repeat phrase 6 points 0 points  Total Score 6 points 0 points    Immunizations Immunization History  Administered Date(s) Administered   Fluad Quad(high Dose 65+) 07/16/2019, 07/18/2020, 07/20/2021, 07/23/2022   Influenza Split 07/09/2011, 06/24/2012   Influenza Whole 05/05/2008, 05/06/2009, 07/12/2010   Influenza, High Dose Seasonal PF 06/30/2013, 07/05/2015, 07/09/2016, 07/11/2017, 07/14/2018   Influenza,inj,Quad PF,6+ Mos 07/02/2014   PFIZER(Purple Top)SARS-COV-2 Vaccination 09/17/2019, 10/07/2019, 06/25/2020   PNEUMOCOCCAL CONJUGATE-20 07/23/2022   Pfizer Covid-19 Vaccine Bivalent Booster 28yrs & up 07/20/2021   Pneumococcal  Conjugate-13 07/02/2014   Pneumococcal Polysaccharide-23 07/13/2006, 07/14/2018   Td 05/21/2007, 07/11/2017    TDAP status: Up to date  Flu Vaccine status: Due, Education has been provided regarding the importance of this vaccine. Advised may receive this vaccine at local pharmacy or Health Dept. Aware to provide a copy of the vaccination record if obtained from local pharmacy or Health Dept. Verbalized acceptance and understanding.  Pneumococcal vaccine status: Up to date  Covid-19 vaccine status: Information provided on how to obtain vaccines.   Qualifies for Shingles Vaccine? Yes   Zostavax completed No   Shingrix Completed?: No.    Education has been provided regarding the importance of this vaccine. Patient has been advised to call insurance company to determine out of pocket expense if they have not yet received this vaccine. Advised may also receive vaccine at local pharmacy or Health Dept. Verbalized acceptance and understanding.  Screening Tests Health Maintenance  Topic Date Due   Zoster Vaccines- Shingrix (1 of 2) Never done   COVID-19 Vaccine (5 - 2023-24 season) 04/13/2022   Medicare Annual Wellness (AWV)  03/15/2023   INFLUENZA VACCINE  03/14/2023   DTaP/Tdap/Td (3 - Tdap) 07/12/2027   Pneumonia Vaccine 20+ Years old  Completed   DEXA SCAN  Completed   HPV VACCINES  Aged Out    Health Maintenance  Health Maintenance Due  Topic Date Due   Zoster Vaccines- Shingrix (1 of 2) Never done   COVID-19 Vaccine (5 - 2023-24 season) 04/13/2022   Medicare Annual Wellness (AWV)  03/15/2023   INFLUENZA VACCINE  03/14/2023    Colorectal cancer screening: No longer required.   Mammogram status: pt declined  Bone Density status: pt declined  Lung Cancer Screening: (Low Dose CT Chest recommended if Age 89-80 years, 20 pack-year currently smoking OR have quit w/in 15years.) does not qualify.   Additional Screening:  Hepatitis C Screening: does not qualify  Vision  Screening: Recommended annual ophthalmology exams for early detection of glaucoma and other disorders of the eye. Is the patient up to date with their annual eye exam?  Yes  Who is the provider or what is the name of the office in which the patient attends annual eye exams? Dr. Charlotte Sanes If pt is not established with a provider, would they like to be referred to a provider to establish care? No .   Dental Screening: Recommended annual dental exams for proper oral hygiene  Diabetic Foot Exam: N/a  Community Resource Referral / Chronic Care Management: CRR required this visit?  No   CCM required this visit?  No     Plan:     I have personally reviewed and noted the following in the patient's chart:   Medical and social history Use of alcohol, tobacco or illicit drugs  Current medications and supplements including opioid prescriptions. Patient is not currently taking opioid  prescriptions. Functional ability and status Nutritional status Physical activity Advanced directives List of other physicians Hospitalizations, surgeries, and ER visits in previous 12 months Vitals Screenings to include cognitive, depression, and falls Referrals and appointments  In addition, I have reviewed and discussed with patient certain preventive protocols, quality metrics, and best practice recommendations. A written personalized care plan for preventive services as well as general preventive health recommendations were provided to patient.     Donne Anon, CMA   03/19/2023   After Visit Summary: pt declined  Nurse Notes: None

## 2023-03-19 NOTE — Patient Instructions (Signed)
Doris Mcdaniel , Thank you for taking time to come for your Medicare Wellness Visit. I appreciate your ongoing commitment to your health goals. Please review the following plan we discussed and let me know if I can assist you in the future.      This is a list of the screening recommended for you and due dates:  Health Maintenance  Topic Date Due   Zoster (Shingles) Vaccine (1 of 2) Never done   COVID-19 Vaccine (5 - 2023-24 season) 04/13/2022   Flu Shot  03/14/2023   Medicare Annual Wellness Visit  03/18/2024   DTaP/Tdap/Td vaccine (3 - Tdap) 07/12/2027   Pneumonia Vaccine  Completed   DEXA scan (bone density measurement)  Completed   HPV Vaccine  Aged Out    Next appointment: Follow up in one year for your annual wellness visit.   Preventive Care 24 Years and Older, Female Preventive care refers to lifestyle choices and visits with your health care provider that can promote health and wellness. What does preventive care include? A yearly physical exam. This is also called an annual well check. Dental exams once or twice a year. Routine eye exams. Ask your health care provider how often you should have your eyes checked. Personal lifestyle choices, including: Daily care of your teeth and gums. Regular physical activity. Eating a healthy diet. Avoiding tobacco and drug use. Limiting alcohol use. Practicing safe sex. Taking low-dose aspirin every day. Taking vitamin and mineral supplements as recommended by your health care provider. What happens during an annual well check? The services and screenings done by your health care provider during your annual well check will depend on your age, overall health, lifestyle risk factors, and family history of disease. Counseling  Your health care provider may ask you questions about your: Alcohol use. Tobacco use. Drug use. Emotional well-being. Home and relationship well-being. Sexual activity. Eating habits. History of  falls. Memory and ability to understand (cognition). Work and work Astronomer. Reproductive health. Screening  You may have the following tests or measurements: Height, weight, and BMI. Blood pressure. Lipid and cholesterol levels. These may be checked every 5 years, or more frequently if you are over 40 years old. Skin check. Lung cancer screening. You may have this screening every year starting at age 26 if you have a 30-pack-year history of smoking and currently smoke or have quit within the past 15 years. Fecal occult blood test (FOBT) of the stool. You may have this test every year starting at age 47. Flexible sigmoidoscopy or colonoscopy. You may have a sigmoidoscopy every 5 years or a colonoscopy every 10 years starting at age 59. Hepatitis C blood test. Hepatitis B blood test. Sexually transmitted disease (STD) testing. Diabetes screening. This is done by checking your blood sugar (glucose) after you have not eaten for a while (fasting). You may have this done every 1-3 years. Bone density scan. This is done to screen for osteoporosis. You may have this done starting at age 52. Mammogram. This may be done every 1-2 years. Talk to your health care provider about how often you should have regular mammograms. Talk with your health care provider about your test results, treatment options, and if necessary, the need for more tests. Vaccines  Your health care provider may recommend certain vaccines, such as: Influenza vaccine. This is recommended every year. Tetanus, diphtheria, and acellular pertussis (Tdap, Td) vaccine. You may need a Td booster every 10 years. Zoster vaccine. You may need this after age  60. Pneumococcal 13-valent conjugate (PCV13) vaccine. One dose is recommended after age 62. Pneumococcal polysaccharide (PPSV23) vaccine. One dose is recommended after age 60. Talk to your health care provider about which screenings and vaccines you need and how often you need  them. This information is not intended to replace advice given to you by your health care provider. Make sure you discuss any questions you have with your health care provider. Document Released: 08/26/2015 Document Revised: 04/18/2016 Document Reviewed: 05/31/2015 Elsevier Interactive Patient Education  2017 [employer].  Fall Prevention in the Home Falls can cause injuries. They can happen to people of all ages. There are many things you can do to make your home safe and to help prevent falls. What can I do on the outside of my home? Regularly fix the edges of walkways and driveways and fix any cracks. Remove anything that might make you trip as you walk through a door, such as a raised step or threshold. Trim any bushes or trees on the path to your home. Use bright outdoor lighting. Clear any walking paths of anything that might make someone trip, such as rocks or tools. Regularly check to see if handrails are loose or broken. Make sure that both sides of any steps have handrails. Any raised decks and porches should have guardrails on the edges. Have any leaves, snow, or ice cleared regularly. Use sand or salt on walking paths during winter. Clean up any spills in your garage right away. This includes oil or grease spills. What can I do in the bathroom? Use night lights. Install grab bars by the toilet and in the tub and shower. Do not use towel bars as grab bars. Use non-skid mats or decals in the tub or shower. If you need to sit down in the shower, use a plastic, non-slip stool. Keep the floor dry. Clean up any water that spills on the floor as soon as it happens. Remove soap buildup in the tub or shower regularly. Attach bath mats securely with double-sided non-slip rug tape. Do not have throw rugs and other things on the floor that can make you trip. What can I do in the bedroom? Use night lights. Make sure that you have a light by your bed that is easy to reach. Do not use  any sheets or blankets that are too big for your bed. They should not hang down onto the floor. Have a firm chair that has side arms. You can use this for support while you get dressed. Do not have throw rugs and other things on the floor that can make you trip. What can I do in the kitchen? Clean up any spills right away. Avoid walking on wet floors. Keep items that you use a lot in easy-to-reach places. If you need to reach something above you, use a strong step stool that has a grab bar. Keep electrical cords out of the way. Do not use floor polish or wax that makes floors slippery. If you must use wax, use non-skid floor wax. Do not have throw rugs and other things on the floor that can make you trip. What can I do with my stairs? Do not leave any items on the stairs. Make sure that there are handrails on both sides of the stairs and use them. Fix handrails that are broken or loose. Make sure that handrails are as long as the stairways. Check any carpeting to make sure that it is firmly attached to the stairs. Fix  any carpet that is loose or worn. Avoid having throw rugs at the top or bottom of the stairs. If you do have throw rugs, attach them to the floor with carpet tape. Make sure that you have a light switch at the top of the stairs and the bottom of the stairs. If you do not have them, ask someone to add them for you. What else can I do to help prevent falls? Wear shoes that: Do not have high heels. Have rubber bottoms. Are comfortable and fit you well. Are closed at the toe. Do not wear sandals. If you use a stepladder: Make sure that it is fully opened. Do not climb a closed stepladder. Make sure that both sides of the stepladder are locked into place. Ask someone to hold it for you, if possible. Clearly mark and make sure that you can see: Any grab bars or handrails. First and last steps. Where the edge of each step is. Use tools that help you move around (mobility aids)  if they are needed. These include: Canes. Walkers. Scooters. Crutches. Turn on the lights when you go into a dark area. Replace any light bulbs as soon as they burn out. Set up your furniture so you have a clear path. Avoid moving your furniture around. If any of your floors are uneven, fix them. If there are any pets around you, be aware of where they are. Review your medicines with your doctor. Some medicines can make you feel dizzy. This can increase your chance of falling. Ask your doctor what other things that you can do to help prevent falls. This information is not intended to replace advice given to you by your health care provider. Make sure you discuss any questions you have with your health care provider. Document Released: 05/26/2009 Document Revised: 01/05/2016 Document Reviewed: 09/03/2014 Elsevier Interactive Patient Education  2017 [employer].

## 2023-04-02 DIAGNOSIS — H353211 Exudative age-related macular degeneration, right eye, with active choroidal neovascularization: Secondary | ICD-10-CM | POA: Diagnosis not present

## 2023-04-02 DIAGNOSIS — Z961 Presence of intraocular lens: Secondary | ICD-10-CM | POA: Diagnosis not present

## 2023-04-02 DIAGNOSIS — H353122 Nonexudative age-related macular degeneration, left eye, intermediate dry stage: Secondary | ICD-10-CM | POA: Diagnosis not present

## 2023-04-02 DIAGNOSIS — H43813 Vitreous degeneration, bilateral: Secondary | ICD-10-CM | POA: Diagnosis not present

## 2023-04-02 DIAGNOSIS — H35033 Hypertensive retinopathy, bilateral: Secondary | ICD-10-CM | POA: Diagnosis not present

## 2023-06-25 DIAGNOSIS — H353211 Exudative age-related macular degeneration, right eye, with active choroidal neovascularization: Secondary | ICD-10-CM | POA: Diagnosis not present

## 2023-07-17 DIAGNOSIS — H5203 Hypermetropia, bilateral: Secondary | ICD-10-CM | POA: Diagnosis not present

## 2023-07-17 DIAGNOSIS — Z961 Presence of intraocular lens: Secondary | ICD-10-CM | POA: Diagnosis not present

## 2023-07-17 DIAGNOSIS — D3131 Benign neoplasm of right choroid: Secondary | ICD-10-CM | POA: Diagnosis not present

## 2023-07-26 ENCOUNTER — Ambulatory Visit (INDEPENDENT_AMBULATORY_CARE_PROVIDER_SITE_OTHER): Payer: PPO | Admitting: Internal Medicine

## 2023-07-26 ENCOUNTER — Telehealth: Payer: Self-pay | Admitting: Internal Medicine

## 2023-07-26 ENCOUNTER — Other Ambulatory Visit: Payer: Self-pay | Admitting: Internal Medicine

## 2023-07-26 ENCOUNTER — Encounter: Payer: Self-pay | Admitting: Internal Medicine

## 2023-07-26 VITALS — BP 134/84 | HR 70 | Temp 97.9°F | Resp 16 | Ht 66.0 in | Wt 184.0 lb

## 2023-07-26 DIAGNOSIS — E785 Hyperlipidemia, unspecified: Secondary | ICD-10-CM

## 2023-07-26 DIAGNOSIS — R739 Hyperglycemia, unspecified: Secondary | ICD-10-CM | POA: Diagnosis not present

## 2023-07-26 DIAGNOSIS — N1832 Chronic kidney disease, stage 3b: Secondary | ICD-10-CM | POA: Diagnosis not present

## 2023-07-26 DIAGNOSIS — I10 Essential (primary) hypertension: Secondary | ICD-10-CM | POA: Diagnosis not present

## 2023-07-26 DIAGNOSIS — D649 Anemia, unspecified: Secondary | ICD-10-CM | POA: Diagnosis not present

## 2023-07-26 DIAGNOSIS — Z23 Encounter for immunization: Secondary | ICD-10-CM

## 2023-07-26 DIAGNOSIS — Z0001 Encounter for general adult medical examination with abnormal findings: Secondary | ICD-10-CM

## 2023-07-26 DIAGNOSIS — Z0000 Encounter for general adult medical examination without abnormal findings: Secondary | ICD-10-CM

## 2023-07-26 DIAGNOSIS — Z780 Asymptomatic menopausal state: Secondary | ICD-10-CM

## 2023-07-26 LAB — CBC WITH DIFFERENTIAL/PLATELET
Basophils Absolute: 0 10*3/uL (ref 0.0–0.1)
Basophils Relative: 0.6 % (ref 0.0–3.0)
Eosinophils Absolute: 0.2 10*3/uL (ref 0.0–0.7)
Eosinophils Relative: 5.3 % — ABNORMAL HIGH (ref 0.0–5.0)
HCT: 29.9 % — ABNORMAL LOW (ref 36.0–46.0)
Hemoglobin: 10 g/dL — ABNORMAL LOW (ref 12.0–15.0)
Lymphocytes Relative: 31.1 % (ref 12.0–46.0)
Lymphs Abs: 1.1 10*3/uL (ref 0.7–4.0)
MCHC: 33.3 g/dL (ref 30.0–36.0)
MCV: 101.7 fL — ABNORMAL HIGH (ref 78.0–100.0)
Monocytes Absolute: 0.3 10*3/uL (ref 0.1–1.0)
Monocytes Relative: 8.7 % (ref 3.0–12.0)
Neutro Abs: 1.9 10*3/uL (ref 1.4–7.7)
Neutrophils Relative %: 54.3 % (ref 43.0–77.0)
Platelets: 133 10*3/uL — ABNORMAL LOW (ref 150.0–400.0)
RBC: 2.94 Mil/uL — ABNORMAL LOW (ref 3.87–5.11)
RDW: 15.1 % (ref 11.5–15.5)
WBC: 3.6 10*3/uL — ABNORMAL LOW (ref 4.0–10.5)

## 2023-07-26 LAB — COMPREHENSIVE METABOLIC PANEL
ALT: 11 U/L (ref 0–35)
AST: 16 U/L (ref 0–37)
Albumin: 4.1 g/dL (ref 3.5–5.2)
Alkaline Phosphatase: 76 U/L (ref 39–117)
BUN: 25 mg/dL — ABNORMAL HIGH (ref 6–23)
CO2: 27 meq/L (ref 19–32)
Calcium: 8.6 mg/dL (ref 8.4–10.5)
Chloride: 104 meq/L (ref 96–112)
Creatinine, Ser: 1.46 mg/dL — ABNORMAL HIGH (ref 0.40–1.20)
GFR: 32.75 mL/min — ABNORMAL LOW (ref >60.00)
Glucose, Bld: 111 mg/dL — ABNORMAL HIGH (ref 70–99)
Potassium: 3.9 meq/L (ref 3.5–5.1)
Sodium: 142 meq/L (ref 135–145)
Total Bilirubin: 0.6 mg/dL (ref 0.2–1.2)
Total Protein: 6.6 g/dL (ref 6.0–8.3)

## 2023-07-26 LAB — HEMOGLOBIN A1C: Hgb A1c MFr Bld: 5.7 % (ref 4.6–6.5)

## 2023-07-26 LAB — LIPID PANEL
Cholesterol: 131 mg/dL (ref 0–200)
HDL: 49.1 mg/dL (ref >39.00)
LDL Cholesterol: 59 mg/dL (ref 0–99)
NonHDL: 81.43
Total CHOL/HDL Ratio: 3
Triglycerides: 110 mg/dL (ref 0.0–149.0)
VLDL: 22 mg/dL (ref 0.0–40.0)

## 2023-07-26 LAB — IBC + FERRITIN
Ferritin: 226.9 ng/mL (ref 10.0–291.0)
Iron: 95 ug/dL (ref 42–145)
Saturation Ratios: 38.8 % (ref 20.0–50.0)
TIBC: 245 ug/dL — ABNORMAL LOW (ref 250.0–450.0)
Transferrin: 175 mg/dL — ABNORMAL LOW (ref 212.0–360.0)

## 2023-07-26 MED ORDER — ATORVASTATIN CALCIUM 20 MG PO TABS: 20.0000 mg | ORAL_TABLET | Freq: Every day | ORAL | 1 refills | Status: DC

## 2023-07-26 MED ORDER — HYDROCHLOROTHIAZIDE 25 MG PO TABS: 25.0000 mg | ORAL_TABLET | Freq: Every day | ORAL | 1 refills | Status: DC

## 2023-07-26 MED ORDER — LISINOPRIL 20 MG PO TABS: 20.0000 mg | ORAL_TABLET | Freq: Every day | ORAL | 1 refills | Status: DC

## 2023-07-26 MED ORDER — VERAPAMIL HCL ER 240 MG PO TBCR: 240.0000 mg | EXTENDED_RELEASE_TABLET | Freq: Every day | ORAL | 1 refills | Status: DC

## 2023-07-26 NOTE — Telephone Encounter (Signed)
Prescription Request  07/26/2023  Is this a "Controlled Substance" medicine? No  LOV: 07/26/2023  What is the name of the medication or equipment?   atorvastatin (LIPITOR) 20 MG tablet hydrochlorothiazide (HYDRODIURIL) 25 MG tablet lisinopril (ZESTRIL) 20 MG tablet verapamil (CALAN-SR) 240 MG CR tablet   Have you contacted your pharmacy to request a refill? Yes   Which pharmacy would you like this sent to?  [facility] [address] [place], [place] - [address] [place] [address] Phone: [phone] Fax: [phone]    Patient notified that their request is being sent to the clinical staff for review and that they should receive a response within 2 business days.   Please advise at [facility] [phone]

## 2023-07-26 NOTE — Telephone Encounter (Signed)
Pt was seen today and forgot to request refill on her meds for  atorvastatin (LIPITOR) 20 MG tablet, hydrochlorothiazide (HYDRODIURIL) 25 MG tablet ,  lisinopril (ZESTRIL) 20 MG tablet and  verapamil (CALAN-SR) 240 MG CR tablet :   sent to [facility] [address] [place] - [address] [place] [address] Phone: [phone]  Fax: [phone]. Please advise.

## 2023-07-26 NOTE — Telephone Encounter (Signed)
Pt has already asked for refills, see Jackie's telephone note- we are waiting for lab results.

## 2023-07-26 NOTE — Telephone Encounter (Signed)
Awaiting lab results

## 2023-07-26 NOTE — Patient Instructions (Addendum)
Vaccines I recommend at your pharmacy:  Covid booster Shingrix (shingles) RSV   Check the  blood pressure regularly Blood pressure goal:  between 110/65 and  135/85. If it is consistently higher or lower, let me know  Your kidney function has decreased. Be sure you drink plenty of water, avoid taking anti-inflammatories such as ibuprofen or naproxen over-the-counter.  GO TO THE LAB : Get the blood work     Next visit with me in 4 months. Please schedule it at the front desk    Stop by the first floor and arrange for a bone density test

## 2023-07-26 NOTE — Progress Notes (Unsigned)
Subjective:    Patient ID: Doris Mcdaniel, female    DOB: 16-Sep-1935, 87 y.o.   MRN: [id number]  DOS:  07/26/2023 Type of visit - description: cpx Here for CPX Chronic medical problems addressed. Reports she is doing very good. Has no major concerns. Vision is stable. She is still active, does yard work.   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, age indertiminate    Allergic rhinitis    GERD (gastroesophageal reflux disease)    History of breast cancer 2012   left breast   Hyperlipidemia    Hypertension    Personal history of radiation therapy     Past Surgical History:  Procedure Laterality Date   ABDOMINAL HYSTERECTOMY     BREAST BIOPSY Left 01/24/2011   BREAST LUMPECTOMY Left 02/21/2011   BREAST SURGERY     lumpectomy (L)   CATARACT EXTRACTION     B   CHOLECYSTECTOMY  03/2008   OOPHORECTOMY     they left a part of the ovarly   Social History   Socioeconomic History   Marital status: Married    Spouse name: Not on file   Number of children: 2   Years of education: Not on file   Highest education level: Not on file  Occupational History   Occupation: retired  Tobacco Use   Smoking status: Former    Current packs/day: 0.00    Types: Cigarettes    Quit date: 07/08/1973    Years since quitting: 50.0   Smokeless tobacco: Never   Tobacco comments:    used to smoke 1 PPD  Substance and Sexual Activity   Alcohol use: No   Drug use: No   Sexual activity: Not Currently  Other Topics Concern   Not on file  Social History Narrative   Lives w/ husband . 2 children live near by   Social Drivers of Health   Financial Resource Strain: Low Risk  (03/19/2023)   Overall Financial Resource Strain (CARDIA)    Difficulty of Paying Living Expenses: Not hard at all  Food Insecurity: No Food  Insecurity (03/19/2023)   Hunger Vital Sign    Worried About Running Out of Food in the Last Year: Never true    Ran Out of Food in the Last Year: Never true  Transportation Needs: No Transportation Needs (03/19/2023)   PRAPARE - Administrator, Civil Service (Medical): No    Lack of Transportation (Non-Medical): No  Physical Activity: Inactive (03/19/2023)   Exercise Vital Sign    Days of Exercise per Week: 0 days    Minutes of Exercise per Session: 0 min  Stress: No Stress Concern Present (03/19/2023)   [employer] of Occupational Health - Occupational Stress Questionnaire    Feeling of Stress : Not at all  Social Connections: Moderately Isolated (03/19/2023)   Social Connection and Isolation Panel [NHANES]    Frequency of Communication with Friends and Family: Three times a week    Frequency of Social Gatherings with Friends and Family: Never    Attends Religious Services: Never    Database administrator or Organizations: No    Attends Banker Meetings: Never    Marital Status: Married  Catering manager Violence: Not At Risk (03/19/2023)   Humiliation,  Afraid, Rape, and Kick questionnaire    Fear of Current or Ex-Partner: No    Emotionally Abused: No    Physically Abused: No    Sexually Abused: No    Current Outpatient Medications  Medication Instructions   acetaminophen (TYLENOL) 1,000 mg, Oral, 2 times daily PRN   atorvastatin (LIPITOR) 20 mg, Oral, Daily at bedtime   CALCIUM-VITAMIN D PO Take by mouth.   carboxymethylcellulose (REFRESH PLUS) 0.5 % SOLN 1 drop, Daily PRN   hydrochlorothiazide (HYDRODIURIL) 25 mg, Oral, Daily   lisinopril (ZESTRIL) 20 mg, Oral, Daily   loratadine (CLARITIN) 10 mg, Oral, Daily   Multiple Vitamin (MULTIVITAMIN) capsule 1 capsule, Daily   Multiple Vitamins-Minerals (PRESERVISION AREDS PO) Take by mouth.   verapamil (CALAN-SR) 240 mg, Oral, Daily at bedtime       Objective:   Physical Exam BP 134/84   Pulse 70    Temp 97.9 F (36.6 C) (Oral)   Resp 16   Ht 5\' 6"  (1.676 m)   Wt 184 lb (83.5 kg)   SpO2 97%   BMI 29.70 kg/m  General: Well developed, NAD, BMI noted Neck: No  thyromegaly  HEENT:  Normocephalic . Face symmetric, atraumatic Lungs:  CTA B Normal respiratory effort, no intercostal retractions, no accessory muscle use. Heart: RRR,  no murmur.  Abdomen:  Not distended, soft, non-tender. No rebound or rigidity.   Lower extremities: no pretibial edema bilaterally  Skin: Exposed areas without rash. Not pale. Not jaundice Neurologic:  alert & oriented X3.  Speech normal, gait appropriate for age and unassisted Strength symmetric and appropriate for age.  Psych: Cognition and judgment appear intact.  Cooperative with normal attention span and concentration.  Behavior appropriate. No anxious or depressed appearing.     Assessment    Assessment  HTN Hyperlipidemia CKD, GFR 30 (2023) GERD DJD  Breast cancer, 2012, left, lumpectomy, XRT. Does not see oncology regularly  MSK: --T8  mild compression per x-ray  --DEXA 2014 and  2018: Normal. Vitamin D normal when checked  PLAN  Here for CPX -Td 06-2017  - pnm 23: 2007 and 07/2018; prevnar: 2015.  PNM 20: 07/23/2022 -Flu shot today Vaccines I recommend: Shingrix, COVID booster, RSV. -No furthert cervical, breast or colon cancer screening.  See previous notes. -Labs:  CMP FLP CBC A1c  - Bones: Previous DEXAs normal, history of mild T8 compression fraction.   DEXA ordered last year failed, will try again -Lifestyle: She remains active.  Does yard work, fall prevention discussed. -Healthcare POA: On file HTN: Well-controlled today, recommend to check ambulatory BPs, continue HCTZ, lisinopril, verapamil.  Checking labs. Hyperlipidemia: On atorvastatin, check FLP. CKD: Creatinine gradually increased, last creatinine 1.5, GFR around 30, renal ultrasound 08-2022 with no obstruction.  Recommend good hydration, avoid NSAIDs, continue  monitoring BMPs. GERD: On no medications, denies symptoms. Mild anemia: No GI symptoms, iron and ferritin normal (December 2023), recheck a CBC and anemia panel. RTC 4 months

## 2023-07-26 NOTE — Telephone Encounter (Signed)
Rxs sent

## 2023-07-27 ENCOUNTER — Encounter: Payer: Self-pay | Admitting: Internal Medicine

## 2023-07-27 NOTE — Assessment & Plan Note (Signed)
Here for CPX HTN: Well-controlled today, recommend to check ambulatory BPs, continue HCTZ, lisinopril, verapamil.  Checking labs. Hyperlipidemia: On atorvastatin, check FLP. CKD: Creatinine gradually increased, last creatinine 1.5, GFR around 30, renal ultrasound 08-2022 with no obstruction.  Recommend good hydration, avoid NSAIDs, continue monitoring BMPs. GERD: On no medications, denies symptoms. Mild anemia: No GI symptoms, iron and ferritin normal (December 2023), recheck a CBC and anemia panel. RTC 4 months

## 2023-07-27 NOTE — Assessment & Plan Note (Signed)
Here for CPX -Td 06-2017  - pnm 23: 2007 and 07/2018; prevnar: 2015.  PNM 20: 07/23/2022 -Flu shot today Vaccines I recommend: Shingrix, COVID booster, RSV. -No furthert cervical, breast or colon cancer screening.  See previous notes. -Labs:  CMP FLP CBC A1c  - Bones: Previous DEXAs normal, history of mild T8 compression fraction.   DEXA ordered last year failed, will try again -Lifestyle: She remains active.  Does yard work, fall prevention discussed. -Healthcare POA: On file

## 2023-08-19 ENCOUNTER — Ambulatory Visit (HOSPITAL_BASED_OUTPATIENT_CLINIC_OR_DEPARTMENT_OTHER)
Admission: RE | Admit: 2023-08-19 | Discharge: 2023-08-19 | Disposition: A | Discharge status: Home / Self Care | Payer: PPO | Source: Ambulatory Visit | Attending: Internal Medicine | Admitting: Internal Medicine

## 2023-08-19 DIAGNOSIS — M85851 Other specified disorders of bone density and structure, right thigh: Secondary | ICD-10-CM | POA: Diagnosis not present

## 2023-08-19 DIAGNOSIS — Z780 Asymptomatic menopausal state: Secondary | ICD-10-CM | POA: Diagnosis not present

## 2023-08-23 ENCOUNTER — Telehealth: Payer: Self-pay

## 2023-08-23 NOTE — Telephone Encounter (Signed)
 Copied from CRM [id number]. Topic: General - Other >> Aug 23, 2023  2:01 PM Susanna ORN wrote: Reason for CRM: Patient called to speak with Dr. Amon about some tests. Advised pt that the clinic closed early today due to weather. Please give pt a call on next business day. CB # [phone].

## 2023-08-26 NOTE — Telephone Encounter (Signed)
 Spoke w/ Pt-she wanted to discuss bone density results. I gave her the results and recommendations, she wants to speak directly with Dr. Drue Novel about the Fosamax

## 2023-08-27 MED ORDER — ALENDRONATE SODIUM 70 MG PO TABS: 70.0000 mg | ORAL_TABLET | ORAL | 3 refills | Status: DC

## 2023-08-27 NOTE — Telephone Encounter (Signed)
 Rx sent

## 2023-08-27 NOTE — Telephone Encounter (Signed)
 T-score -1.4. Risk of hip fracture 4.3%.  Risk of major osteoporotic fracture 18.7%. Treatment is appropriate  This was discussed with the patient, Fosamax  vs injectables. Elected Fosamax , how to take it discussed with the patient.  Please send the prescription: 70 mg weekly x 1 year

## 2023-10-08 DIAGNOSIS — H35033 Hypertensive retinopathy, bilateral: Secondary | ICD-10-CM | POA: Diagnosis not present

## 2023-10-08 DIAGNOSIS — Z961 Presence of intraocular lens: Secondary | ICD-10-CM | POA: Diagnosis not present

## 2023-10-08 DIAGNOSIS — H353211 Exudative age-related macular degeneration, right eye, with active choroidal neovascularization: Secondary | ICD-10-CM | POA: Diagnosis not present

## 2023-10-08 DIAGNOSIS — H353122 Nonexudative age-related macular degeneration, left eye, intermediate dry stage: Secondary | ICD-10-CM | POA: Diagnosis not present

## 2023-10-08 DIAGNOSIS — H43813 Vitreous degeneration, bilateral: Secondary | ICD-10-CM | POA: Diagnosis not present

## 2023-11-25 ENCOUNTER — Ambulatory Visit: Payer: PPO | Admitting: Internal Medicine

## 2023-11-29 ENCOUNTER — Ambulatory Visit: Payer: PPO | Admitting: Internal Medicine

## 2023-12-02 ENCOUNTER — Encounter: Payer: Self-pay | Admitting: Internal Medicine

## 2023-12-02 ENCOUNTER — Ambulatory Visit (INDEPENDENT_AMBULATORY_CARE_PROVIDER_SITE_OTHER): Payer: PPO | Admitting: Internal Medicine

## 2023-12-02 VITALS — BP 136/64 | HR 67 | Temp 98.2°F | Resp 16 | Ht 66.0 in | Wt 178.1 lb

## 2023-12-02 DIAGNOSIS — D649 Anemia, unspecified: Secondary | ICD-10-CM | POA: Diagnosis not present

## 2023-12-02 DIAGNOSIS — I10 Essential (primary) hypertension: Secondary | ICD-10-CM | POA: Diagnosis not present

## 2023-12-02 DIAGNOSIS — N1832 Chronic kidney disease, stage 3b: Secondary | ICD-10-CM | POA: Diagnosis not present

## 2023-12-02 DIAGNOSIS — F32A Depression, unspecified: Secondary | ICD-10-CM | POA: Diagnosis not present

## 2023-12-02 DIAGNOSIS — R5383 Other fatigue: Secondary | ICD-10-CM

## 2023-12-02 DIAGNOSIS — N189 Chronic kidney disease, unspecified: Secondary | ICD-10-CM

## 2023-12-02 DIAGNOSIS — N183 Chronic kidney disease, stage 3 unspecified: Secondary | ICD-10-CM | POA: Insufficient documentation

## 2023-12-02 DIAGNOSIS — E785 Hyperlipidemia, unspecified: Secondary | ICD-10-CM

## 2023-12-02 LAB — BASIC METABOLIC PANEL WITH GFR
BUN: 21 mg/dL (ref 6–23)
CO2: 26 meq/L (ref 19–32)
Calcium: 8.3 mg/dL — ABNORMAL LOW (ref 8.4–10.5)
Chloride: 103 meq/L (ref 96–112)
Creatinine, Ser: 1.52 mg/dL — ABNORMAL HIGH (ref 0.40–1.20)
GFR: 31.12 mL/min — ABNORMAL LOW (ref >60.00)
Glucose, Bld: 109 mg/dL — ABNORMAL HIGH (ref 70–99)
Potassium: 3.6 meq/L (ref 3.5–5.1)
Sodium: 139 meq/L (ref 135–145)

## 2023-12-02 LAB — TSH: TSH: 1.55 u[IU]/mL (ref 0.35–5.50)

## 2023-12-02 LAB — CBC WITH DIFFERENTIAL/PLATELET
Basophils Absolute: 0 10*3/uL (ref 0.0–0.1)
Basophils Relative: 0.5 % (ref 0.0–3.0)
Eosinophils Absolute: 0.1 10*3/uL (ref 0.0–0.7)
Eosinophils Relative: 3.2 % (ref 0.0–5.0)
HCT: 25.9 % — ABNORMAL LOW (ref 36.0–46.0)
Hemoglobin: 8.7 g/dL — ABNORMAL LOW (ref 12.0–15.0)
Lymphocytes Relative: 35.9 % (ref 12.0–46.0)
Lymphs Abs: 1.2 10*3/uL (ref 0.7–4.0)
MCHC: 33.6 g/dL (ref 30.0–36.0)
MCV: 108.7 fl — ABNORMAL HIGH (ref 78.0–100.0)
Monocytes Absolute: 0.3 10*3/uL (ref 0.1–1.0)
Monocytes Relative: 9.3 % (ref 3.0–12.0)
Neutro Abs: 1.7 10*3/uL (ref 1.4–7.7)
Neutrophils Relative %: 51.1 % (ref 43.0–77.0)
Platelets: 87 10*3/uL — ABNORMAL LOW (ref 150.0–400.0)
RBC: 2.38 Mil/uL — ABNORMAL LOW (ref 3.87–5.11)
RDW: 15.7 % — ABNORMAL HIGH (ref 11.5–15.5)
WBC: 3.4 10*3/uL — ABNORMAL LOW (ref 4.0–10.5)

## 2023-12-02 NOTE — Assessment & Plan Note (Signed)
 HTN: Currently on HCTZ, lisinopril , verapamil .  Recommend to monitor BPs at home. CKD: Check a BMP today. Osteopenia: T-score January 2025 was -1.0, history of compression fracture, started Fosamax .  Good compliance with instructions. Lack of energy: Without cardiopulmonary symptoms, recommend observation, she does all her ADLs without problems.  Check TSH Depression: Admits to some depression without anxiety and no suicidal ideas.  Mostly related to family issues, she does not see her family frequently enough   Listening therapy provided, she felt relieved after we talk.  Encouraged to see a Veterinary surgeon.  Information provided. RTC 4 months.

## 2023-12-02 NOTE — Progress Notes (Signed)
 Subjective:    Patient ID: Doris Mcdaniel, female    DOB: Apr 17, 1936, 88 y.o.   MRN: [id number]  DOS:  12/02/2023 Type of visit - description: Routine follow-up  Here for a checkup.  Chronic medical problems addressed. She also reports the following: Feeling tired, stamina is not the same as last year. Denies chest pain or difficulty breathing.  No palpitation. Minimal lower extremity edema on and off. She is able to do all her ADLs without major problems including going shopping.  Also likes to talk about depression.  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, age indertiminate    Allergic rhinitis    GERD (gastroesophageal reflux disease)    History of breast cancer 2012   left breast   Hyperlipidemia    Hypertension    Personal history of radiation therapy     Past Surgical History:  Procedure Laterality Date   ABDOMINAL HYSTERECTOMY     BREAST BIOPSY Left 01/24/2011   BREAST LUMPECTOMY Left 02/21/2011   BREAST SURGERY     lumpectomy (L)   CATARACT EXTRACTION     B   CHOLECYSTECTOMY  03/2008   OOPHORECTOMY     they left a part of the ovarly    Current Outpatient Medications  Medication Instructions   acetaminophen  (TYLENOL ) 1,000 mg, Oral, 2 times daily PRN   alendronate  (FOSAMAX ) 70 mg, Oral, Every 7 days, Take with a full glass of water on an empty stomach.   atorvastatin  (LIPITOR) 20 mg, Oral, Daily at bedtime   CALCIUM -VITAMIN D PO Take by mouth.   carboxymethylcellulose (REFRESH PLUS) 0.5 % SOLN 1 drop, Daily PRN   hydrochlorothiazide  (HYDRODIURIL ) 25 mg, Oral, Daily   lisinopril  (ZESTRIL ) 20 mg, Oral, Daily   loratadine  (CLARITIN ) 10 mg, Oral, Daily   Multiple Vitamin (MULTIVITAMIN) capsule 1 capsule, Daily   Multiple Vitamins-Minerals (PRESERVISION AREDS PO) Take by mouth.   verapamil  (CALAN -SR) 240 mg, Oral, Daily at bedtime        Objective:   Physical Exam BP 136/64   Pulse 67   Temp 98.2 F (36.8 C) (Oral)   Resp 16   Ht 5\' 6"  (1.676 m)   Wt 178 lb 2 oz (80.8 kg)   SpO2 97%   BMI 28.75 kg/m  General:   Well developed, NAD, BMI noted. HEENT:  Normocephalic . Face symmetric, atraumatic Lungs:  CTA B Normal respiratory effort, no intercostal retractions, no accessory muscle use. Heart: RRR,  no murmur.  Lower extremities: no pretibial edema bilaterally.  Trace edema at the peri ankle area Skin: Not pale. Not jaundice Neurologic:  alert & oriented X3.  Speech normal, gait appropriate for age and unassisted Psych--  Cognition and judgment appear intact.  Cooperative with normal attention span and concentration.  Behavior appropriate. No anxious or depressed appearing.      Assessment     Assessment  HTN Hyperlipidemia CKD, GFR 30 (2023) GERD DJD  Breast cancer, 2012, left, lumpectomy, XRT. Does not see oncology regularly  MSK: --T8  mild compression per x-ray  --DEXA 2014 and  2018: Normal. Vitamin D normal when checked -- T score 08/2023 - 1.0: , started fosamax  08/2023  PLAN  HTN: Currently on HCTZ, lisinopril , verapamil .  Recommend to monitor BPs at home. CKD: Check a BMP today. Osteopenia: T-score January 2025 was -1.0, history of compression fracture,  started Fosamax .  Good compliance with instructions. Lack of energy: Without cardiopulmonary symptoms, recommend observation, she does all her ADLs without problems.  Check TSH Depression: Admits to some depression without anxiety and no suicidal ideas.  Mostly related to family issues, she does not see her family frequently enough   Listening therapy provided, she felt relieved after we talk.  Encouraged to see a Veterinary surgeon.  Information provided. RTC 4 months.

## 2023-12-02 NOTE — Addendum Note (Signed)
 Addended by: Payslie Mccaig D on: 12/02/2023 04:45 PM   Modules accepted: Orders

## 2023-12-02 NOTE — Patient Instructions (Addendum)
 INSTRUCTIONS  FOR TODAY   Check the  blood pressure regularly Blood pressure goal:  between 110/65 and  135/85. If it is consistently higher or lower, let me know     GO TO THE LAB : Get the blood work     Next office visit for a  Please make an appointment before you leave today Depending on your blood or XRs results it might be necessary to come back sooner    STOP BY THE FIRST FLOOR:  get the XR    If you have MyChart, please check frequently in the next few days for your results on seems well-controlled   Next office visit for a checkup in 4 months Please make an appointment before you leave today

## 2023-12-03 ENCOUNTER — Ambulatory Visit (INDEPENDENT_AMBULATORY_CARE_PROVIDER_SITE_OTHER)

## 2023-12-03 DIAGNOSIS — D649 Anemia, unspecified: Secondary | ICD-10-CM | POA: Diagnosis not present

## 2023-12-03 DIAGNOSIS — H353211 Exudative age-related macular degeneration, right eye, with active choroidal neovascularization: Secondary | ICD-10-CM | POA: Diagnosis not present

## 2023-12-03 LAB — IBC + FERRITIN
Ferritin: 279.9 ng/mL (ref 10.0–291.0)
Iron: 104 ug/dL (ref 42–145)
Saturation Ratios: 43.7 % (ref 20.0–50.0)
TIBC: 238 ug/dL — ABNORMAL LOW (ref 250.0–450.0)
Transferrin: 170 mg/dL — ABNORMAL LOW (ref 212.0–360.0)

## 2023-12-04 NOTE — Addendum Note (Signed)
 Addended by: Zong Mcquarrie D on: 12/04/2023 09:59 AM   Modules accepted: Orders

## 2023-12-10 ENCOUNTER — Encounter: Payer: Self-pay | Admitting: Medical Oncology

## 2023-12-10 ENCOUNTER — Inpatient Hospital Stay: Attending: Medical Oncology

## 2023-12-10 ENCOUNTER — Other Ambulatory Visit: Payer: Self-pay | Admitting: *Deleted

## 2023-12-10 ENCOUNTER — Inpatient Hospital Stay (HOSPITAL_BASED_OUTPATIENT_CLINIC_OR_DEPARTMENT_OTHER): Admitting: Medical Oncology

## 2023-12-10 VITALS — BP 129/35 | HR 74 | Temp 97.7°F | Resp 19 | Ht 66.0 in | Wt 177.4 lb

## 2023-12-10 DIAGNOSIS — Z8249 Family history of ischemic heart disease and other diseases of the circulatory system: Secondary | ICD-10-CM | POA: Insufficient documentation

## 2023-12-10 DIAGNOSIS — Z90721 Acquired absence of ovaries, unilateral: Secondary | ICD-10-CM | POA: Diagnosis not present

## 2023-12-10 DIAGNOSIS — D709 Neutropenia, unspecified: Secondary | ICD-10-CM | POA: Insufficient documentation

## 2023-12-10 DIAGNOSIS — Z9049 Acquired absence of other specified parts of digestive tract: Secondary | ICD-10-CM | POA: Diagnosis not present

## 2023-12-10 DIAGNOSIS — D72829 Elevated white blood cell count, unspecified: Secondary | ICD-10-CM | POA: Diagnosis not present

## 2023-12-10 DIAGNOSIS — R779 Abnormality of plasma protein, unspecified: Secondary | ICD-10-CM | POA: Insufficient documentation

## 2023-12-10 DIAGNOSIS — D75839 Thrombocytosis, unspecified: Secondary | ICD-10-CM

## 2023-12-10 DIAGNOSIS — Z853 Personal history of malignant neoplasm of breast: Secondary | ICD-10-CM | POA: Diagnosis not present

## 2023-12-10 DIAGNOSIS — D696 Thrombocytopenia, unspecified: Secondary | ICD-10-CM

## 2023-12-10 DIAGNOSIS — I129 Hypertensive chronic kidney disease with stage 1 through stage 4 chronic kidney disease, or unspecified chronic kidney disease: Secondary | ICD-10-CM | POA: Insufficient documentation

## 2023-12-10 DIAGNOSIS — R5383 Other fatigue: Secondary | ICD-10-CM | POA: Insufficient documentation

## 2023-12-10 DIAGNOSIS — R718 Other abnormality of red blood cells: Secondary | ICD-10-CM | POA: Insufficient documentation

## 2023-12-10 DIAGNOSIS — N1832 Chronic kidney disease, stage 3b: Secondary | ICD-10-CM | POA: Diagnosis not present

## 2023-12-10 DIAGNOSIS — R0602 Shortness of breath: Secondary | ICD-10-CM | POA: Insufficient documentation

## 2023-12-10 DIAGNOSIS — D539 Nutritional anemia, unspecified: Secondary | ICD-10-CM

## 2023-12-10 DIAGNOSIS — E785 Hyperlipidemia, unspecified: Secondary | ICD-10-CM | POA: Insufficient documentation

## 2023-12-10 DIAGNOSIS — D649 Anemia, unspecified: Secondary | ICD-10-CM | POA: Insufficient documentation

## 2023-12-10 DIAGNOSIS — Z90710 Acquired absence of both cervix and uterus: Secondary | ICD-10-CM | POA: Diagnosis not present

## 2023-12-10 DIAGNOSIS — Z87891 Personal history of nicotine dependence: Secondary | ICD-10-CM

## 2023-12-10 DIAGNOSIS — Z79899 Other long term (current) drug therapy: Secondary | ICD-10-CM | POA: Diagnosis not present

## 2023-12-10 LAB — CMP (CANCER CENTER ONLY)
ALT: 8 U/L (ref 0–44)
AST: 12 U/L — ABNORMAL LOW (ref 15–41)
Albumin: 3 g/dL — ABNORMAL LOW (ref 3.5–5.0)
Alkaline Phosphatase: 46 U/L (ref 38–126)
Anion gap: 11 (ref 5–15)
BUN: 17 mg/dL (ref 8–23)
CO2: 25 mmol/L (ref 22–32)
Calcium: 8.3 mg/dL — ABNORMAL LOW (ref 8.9–10.3)
Chloride: 107 mmol/L (ref 98–111)
Creatinine: 1.46 mg/dL — ABNORMAL HIGH (ref 0.44–1.00)
GFR, Estimated: 35 mL/min — ABNORMAL LOW (ref >60)
Glucose, Bld: 73 mg/dL (ref 70–99)
Potassium: 4.3 mmol/L (ref 3.5–5.1)
Sodium: 143 mmol/L (ref 135–145)
Total Bilirubin: 0.5 mg/dL (ref 0.0–1.2)
Total Protein: 6 g/dL — ABNORMAL LOW (ref 6.5–8.1)

## 2023-12-10 LAB — LACTATE DEHYDROGENASE: LDH: 77 U/L — ABNORMAL LOW (ref 98–192)

## 2023-12-10 LAB — RETIC PANEL
Immature Retic Fract: 23.4 % — ABNORMAL HIGH (ref 2.3–15.9)
RBC.: 2.3 MIL/uL — ABNORMAL LOW (ref 3.87–5.11)
Retic Count, Absolute: 64.4 10*3/uL (ref 19.0–186.0)
Retic Ct Pct: 2.8 % (ref 0.4–3.1)
Reticulocyte Hemoglobin: 37.6 pg (ref >27.9)

## 2023-12-10 LAB — CBC WITH DIFFERENTIAL (CANCER CENTER ONLY)
Abs Immature Granulocytes: 0.02 10*3/uL (ref 0.00–0.07)
Basophils Absolute: 0 10*3/uL (ref 0.0–0.1)
Basophils Relative: 0 %
Eosinophils Absolute: 0.1 10*3/uL (ref 0.0–0.5)
Eosinophils Relative: 3 %
HCT: 25.2 % — ABNORMAL LOW (ref 36.0–46.0)
Hemoglobin: 8.2 g/dL — ABNORMAL LOW (ref 12.0–15.0)
Immature Granulocytes: 1 %
Lymphocytes Relative: 38 %
Lymphs Abs: 1.3 10*3/uL (ref 0.7–4.0)
MCH: 35.8 pg — ABNORMAL HIGH (ref 26.0–34.0)
MCHC: 32.5 g/dL (ref 30.0–36.0)
MCV: 110 fL — ABNORMAL HIGH (ref 80.0–100.0)
Monocytes Absolute: 0.3 10*3/uL (ref 0.1–1.0)
Monocytes Relative: 9 %
Neutro Abs: 1.7 10*3/uL (ref 1.7–7.7)
Neutrophils Relative %: 49 %
Platelet Count: 77 10*3/uL — ABNORMAL LOW (ref 150–400)
RBC: 2.29 MIL/uL — ABNORMAL LOW (ref 3.87–5.11)
RDW: 14.4 % (ref 11.5–15.5)
WBC Count: 3.4 10*3/uL — ABNORMAL LOW (ref 4.0–10.5)
nRBC: 0 % (ref 0.0–0.2)

## 2023-12-10 LAB — SAVE SMEAR(SSMR), FOR PROVIDER SLIDE REVIEW

## 2023-12-10 LAB — FOLATE: Folate: >40 ng/mL (ref >5.9)

## 2023-12-10 LAB — VITAMIN B12: Vitamin B-12: 198 pg/mL (ref 180–914)

## 2023-12-10 NOTE — Progress Notes (Signed)
 [facility] [phone]   [phone]  INITIAL CONSULT NOTE  Patient Care Team: Ezell Hollow, MD as PCP - General Dema Filler, MD as Consulting Physician (Ophthalmology)  CHIEF COMPLAINTS/PURPOSE OF CONSULTATION:  Anemia  HISTORY OF PRESENTING ILLNESS:  Doris Mcdaniel 88 y.o. female is referred to our office by their PCP Dr. Neomi Banks for Anemia thought to be related to CKD:   Anemia started about 2 years ago with a value of 11.9 hgb. She then started to have some neutropenia as well which was noticed 4 months ago. Her last set of blood work 8 days ago showed a WBC of 3.4, ANC of 1.7, Hgb of 8.7, MCV of 108.7, platelets of 87. Creatinine 1.52, calcium  of 8.3,   She does have a history of breast cancer many years ago. She is unsure how many years ago. She reports XRT to one of her breasts. She is unsure. No chemotherapy. She is not UTD on her mammograms.   In terms of symptoms she reports some fatigue with strenuous exercise but no other symptoms.   Blood in stools:No Change of bowel movement/constipation: Chronic constipation for years.  Family history of GI cancers: No Last Colonoscopy: Many years ago- unsure History of GERD or GI bleed: No UC/Crohn's: No PPI use: No Hiatal Hernia: No NSAID use: No Blood in urine: No Difficulty swallowing: No Pica: No SOB: Yes with activity Fatigue: Yes with activity  Prior blood transfusion: No Prior history of blood loss: No Liver disease: No Kidney Disease: Yes Bariatric/Intestinal surgery: No Frequent Blood Donation: No Vaginal bleeding: No Prior evaluation with hematology: No Prior bone marrow biopsy: No Oral iron: Not currently Prior IV iron infusions: No Family history Anemia: No known sickle cell or thalassemia  No Personal or family history of bleeding or clotting disorders.  Special diets: No No use of GLP-1: No Iron rich foods in diet: Some Eating habits: 1 meal per day   There  has been no bleeding to her knowledge: denies epistaxis, gingivitis, hemoptysis, hematemesis, hematuria, melena, excessive bruising, blood donation.   She did not serve in [place], no known exposure to agent orange.  No significant toxin exposures  No unintentional weight loss, night sweats, bone pains   She is not sure how she feels about potential blood products at this time  She does state that she feels as if her body is beginning to "shut down" due to age. She reports that she is ok with this and wants to avoid any significant efforts such as procedures, chemotherapy/ She may be agreeable to pills if suggested depending on potential side effects. She also denies any further referrals as she finds that doing additional imaging, testing, referrals.   Wt Readings from Last 3 Encounters:  12/10/23 177 lb 6.4 oz (80.5 kg)  12/02/23 178 lb 2 oz (80.8 kg)  07/26/23 184 lb (83.5 kg)    MEDICAL HISTORY:  Past Medical History:  Diagnosis Date   Abnormal x-ray of spine    Mild Compression deformity of the the T8 vertebral body, age indertiminate    Allergic rhinitis    GERD (gastroesophageal reflux disease)    History of breast cancer 2012   left breast   Hyperlipidemia    Hypertension    Personal history of radiation therapy     SURGICAL HISTORY: Past Surgical History:  Procedure Laterality Date   ABDOMINAL HYSTERECTOMY     BREAST BIOPSY Left 01/24/2011   BREAST LUMPECTOMY Left 02/21/2011  BREAST SURGERY     lumpectomy (L)   CATARACT EXTRACTION     B   CHOLECYSTECTOMY  03/2008   OOPHORECTOMY     they left a part of the ovarly    SOCIAL HISTORY: Social History   Socioeconomic History   Marital status: Married    Spouse name: Not on file   Number of children: 2   Years of education: Not on file   Highest education level: Not on file  Occupational History   Occupation: retired  Tobacco Use   Smoking status: Former    Current packs/day: 0.00    Types: Cigarettes     Quit date: 07/08/1973    Years since quitting: 50.4   Smokeless tobacco: Never   Tobacco comments:    used to smoke 1 PPD  Vaping Use   Vaping status: Never Used  Substance and Sexual Activity   Alcohol use: No   Drug use: No   Sexual activity: Not Currently  Other Topics Concern   Not on file  Social History Narrative   Lives w/ husband . 2 children live near by   Social Drivers of Health   Financial Resource Strain: Low Risk  (03/19/2023)   Overall Financial Resource Strain (CARDIA)    Difficulty of Paying Living Expenses: Not hard at all  Food Insecurity: No Food Insecurity (12/10/2023)   Hunger Vital Sign    Worried About Running Out of Food in the Last Year: Never true    Ran Out of Food in the Last Year: Never true  Transportation Needs: No Transportation Needs (12/10/2023)   PRAPARE - Administrator, Civil Service (Medical): No    Lack of Transportation (Non-Medical): No  Physical Activity: Inactive (03/19/2023)   Exercise Vital Sign    Days of Exercise per Week: 0 days    Minutes of Exercise per Session: 0 min  Stress: No Stress Concern Present (03/19/2023)   [employer] of Occupational Health - Occupational Stress Questionnaire    Feeling of Stress : Not at all  Social Connections: Moderately Isolated (03/19/2023)   Social Connection and Isolation Panel [NHANES]    Frequency of Communication with Friends and Family: Three times a week    Frequency of Social Gatherings with Friends and Family: Never    Attends Religious Services: Never    Database administrator or Organizations: No    Attends Banker Meetings: Never    Marital Status: Married  Catering manager Violence: Not At Risk (12/10/2023)   Humiliation, Afraid, Rape, and Kick questionnaire    Fear of Current or Ex-Partner: No    Emotionally Abused: No    Physically Abused: No    Sexually Abused: No    FAMILY HISTORY: Family History  Problem Relation Age of Onset   Heart disease  Brother        CABG age ~ 60   Colon cancer Neg Hx    Breast cancer Neg Hx    Stroke Neg Hx    Hypertension Neg Hx    Diabetes Neg Hx     ALLERGIES:  has no known allergies.  MEDICATIONS:  Current Outpatient Medications  Medication Sig Dispense Refill   acetaminophen  (TYLENOL ) 500 MG tablet Take 2 tablets (1,000 mg total) by mouth 2 (two) times daily as needed for mild pain or moderate pain. 30 tablet 0   alendronate  (FOSAMAX ) 70 MG tablet Take 1 tablet (70 mg total) by mouth every 7 (seven) days. Take  with a full glass of water on an empty stomach. 12 tablet 3   atorvastatin  (LIPITOR) 20 MG tablet Take 1 tablet (20 mg total) by mouth at bedtime. 90 tablet 1   CALCIUM -VITAMIN D PO Take by mouth.     carboxymethylcellulose (REFRESH PLUS) 0.5 % SOLN Place 1 drop into both eyes daily as needed.     hydrochlorothiazide  (HYDRODIURIL ) 25 MG tablet Take 1 tablet (25 mg total) by mouth daily. 90 tablet 1   lisinopril  (ZESTRIL ) 20 MG tablet Take 1 tablet (20 mg total) by mouth daily. 90 tablet 1   loratadine  (CLARITIN ) 10 MG tablet Take 1 tablet (10 mg total) by mouth daily. 90 tablet 3   Multiple Vitamin (MULTIVITAMIN) capsule Take 1 capsule by mouth daily.     Multiple Vitamins-Minerals (PRESERVISION AREDS PO) Take by mouth.     verapamil  (CALAN -SR) 240 MG CR tablet Take 1 tablet (240 mg total) by mouth at bedtime. 90 tablet 1   No current facility-administered medications for this visit.    REVIEW OF SYSTEMS:   Constitutional: ( - ) fevers, ( - )  chills , ( - ) night sweats Eyes: ( - ) blurriness of vision, ( - ) double vision, ( - ) watery eyes Ears, nose, mouth, throat, and face: ( - ) mucositis, ( - ) sore throat Respiratory: ( - ) cough, ( - ) dyspnea, ( - ) wheezes Cardiovascular: ( - ) palpitation, ( - ) chest discomfort, ( - ) lower extremity swelling Gastrointestinal:  ( - ) nausea, ( - ) heartburn, ( - ) change in bowel habits Skin: ( - ) abnormal skin rashes Lymphatics: ( -  ) new lymphadenopathy, ( - ) easy bruising Neurological: ( - ) numbness, ( - ) tingling, ( - ) new weaknesses Behavioral/Psych: ( - ) mood change, ( - ) new changes  All other systems were reviewed with the patient and are negative.  PHYSICAL EXAMINATION: ECOG PERFORMANCE STATUS: 1 - Symptomatic but completely ambulatory  Vitals:   12/10/23 1258  BP: (!) 129/35  Pulse: 74  Resp: 19  Temp: 97.7 F (36.5 C)  SpO2: 98%   Filed Weights   12/10/23 1258  Weight: 177 lb 6.4 oz (80.5 kg)    GENERAL: well appearing female in NAD  SKIN: skin color, texture, turgor are normal, no rashes or significant lesions EYES: conjunctiva are light pink and non-injected, sclera clear OROPHARYNX: no exudate, no erythema; lips, buccal mucosa, and tongue normal  NECK: supple, non-tender LYMPH:  no palpable lymphadenopathy in the cervical, axillary or supraclavicular lymph nodes.  LUNGS: clear to auscultation and percussion with normal breathing effort HEART: regular rate & rhythm and no murmurs and no lower extremity edema ABDOMEN: soft, non-tender, non-distended, normal bowel sounds Musculoskeletal: no cyanosis of digits and no clubbing  PSYCH: alert & oriented x 3, fluent speech NEURO: no focal motor/sensory deficits  LABORATORY DATA:  Pending   ASSESSMENT & PLAN Doris Mcdaniel is a 88 y.o. caucasian female who was referred to us  for anemia:   We discussed her history in detail as well as my plans for testing. We are going to do conservative labs for now.   The differentials for neutropenia include benign ethnic neutropenia, infectious etiology, nutritional etiology, inflammatory etiology, or medication.  The patient is not currently taking any medications known to cause neutropenia. We will check for nutritional anemias with B12 and folate levels.  She declines HIV/Hep testing at this time.  I  reviewed potential etiologies for thrombocytopenia including liver disease, splenomegaly,  infectious processes, nutritional anemias, immune mediated and bone marrow disorders. Patient is not taking any medications or recent infections. Patient will proceed with labs today to check CBC, CMP, Vitamin B12, MMA, folate, LDH, and save smear. She would like to hold off on Hepatitis B and C serologies, HIV serology, platelet by citrate at this time.   We also discussed blood products- at this time she is unsure if she would accept a blood transfusion if needed but is agreeable to consider this in the future if needed.    #Thrombocytopenia, etiology unknown: --Labs today to check CBC w/diff, CMP, B12 level --Evaluate for platelet abnormality with save smear. --Abdominal US  declined at this time  The differentials for neutropenia include benign ethnic neutropenia, infectious etiology, nutritional etiology, inflammatory etiology, or medication.  The patient is not currently taking any medications known to cause neutropenia.  We will order viral studies with hep B, hep C, and HIV as well.  In addition, we will check for nutritional anemias with B12 and folate levels.       # Leukopenia/Neutropenia  --Nutritional evaluation with  vitamin B12, MMA and folate.  --Holding off on ESR and CRP at this visit due to patient preference.   --Repeat CBC and CMP.  Additionally, we will order peripheral blood film.   #Anemia --CBC w/, CMP, B12, folate -- Reviewed recent Iron studies which we within target range -- Given concurrent CKD, age will obtain MM studies which she is agreeable with along with a BCR- ABL.   RTC 2 weeks Day 1 APP, labs (CBC w/, CMP, LDH +_ any nutritional labs that were found to be abnormal) Day 2 1 unit blood   Sunnie England PA-C Department of Hematology/Oncology [facility] at [facility]

## 2023-12-11 ENCOUNTER — Telehealth: Payer: Self-pay | Admitting: Medical Oncology

## 2023-12-11 LAB — KAPPA/LAMBDA LIGHT CHAINS
Kappa free light chain: 38 mg/L — ABNORMAL HIGH (ref 3.3–19.4)
Kappa, lambda light chain ratio: 1.77 — ABNORMAL HIGH (ref 0.26–1.65)
Lambda free light chains: 21.5 mg/L (ref 5.7–26.3)

## 2023-12-11 LAB — ERYTHROPOIETIN: Erythropoietin: 125.1 m[IU]/mL — ABNORMAL HIGH (ref 2.6–18.5)

## 2023-12-11 NOTE — Telephone Encounter (Signed)
 Attmepted to call patient several times for her follow up appt. Doris Mcdaniel wants to see her back in 2 weeks no dial tone with number and she is not signed up for mychart. Unable to contact for follow up. Will mail out appointment reminder.

## 2023-12-11 NOTE — Telephone Encounter (Signed)
 Called to schedule f/u visit per LOS -- busy tone. Unable to LVM to return call for scheduling.

## 2023-12-12 LAB — METHYLMALONIC ACID, SERUM: Methylmalonic Acid, Quantitative: 366 nmol/L (ref 0–378)

## 2023-12-13 LAB — MULTIPLE MYELOMA PANEL, SERUM
Albumin SerPl Elph-Mcnc: 3.5 g/dL (ref 2.9–4.4)
Albumin/Glob SerPl: 1.3 (ref 0.7–1.7)
Alpha 1: 0.2 g/dL (ref 0.0–0.4)
Alpha2 Glob SerPl Elph-Mcnc: 0.7 g/dL (ref 0.4–1.0)
B-Globulin SerPl Elph-Mcnc: 0.8 g/dL (ref 0.7–1.3)
Gamma Glob SerPl Elph-Mcnc: 1 g/dL (ref 0.4–1.8)
Globulin, Total: 2.7 g/dL (ref 2.2–3.9)
IgA: 163 mg/dL (ref 64–422)
IgG (Immunoglobin G), Serum: 1042 mg/dL (ref 586–1602)
IgM (Immunoglobulin M), Srm: 42 mg/dL (ref 26–217)
Total Protein ELP: 6.2 g/dL (ref 6.0–8.5)

## 2023-12-14 LAB — BCR-ABL1, CML/ALL, PCR, QUANT
E1A2 Transcript: <0.0032 %
Interpretation (BCRAL):: NEGATIVE
b2a2 transcript: <0.0032 %
b3a2 transcript: <0.0032 %

## 2023-12-16 ENCOUNTER — Telehealth: Payer: Self-pay

## 2023-12-16 NOTE — Telephone Encounter (Signed)
 Copied from CRM [id number]. Topic: Clinical - Lab/Test Results >> Dec 16, 2023 12:45 PM Doris Mcdaniel wrote: Reason for CRM: Patient would like to know about her blood work from a week ago before having more blood work completed, due to not hearing anything. Please call [phone]  if no answer may leave detail message on phone

## 2023-12-16 NOTE — Telephone Encounter (Signed)
 Pt called our office regarding results from your office. Please contact Pt. Thank you.

## 2023-12-17 ENCOUNTER — Other Ambulatory Visit: Payer: Self-pay | Admitting: Medical Oncology

## 2023-12-17 DIAGNOSIS — R768 Other specified abnormal immunological findings in serum: Secondary | ICD-10-CM

## 2023-12-24 ENCOUNTER — Inpatient Hospital Stay (HOSPITAL_BASED_OUTPATIENT_CLINIC_OR_DEPARTMENT_OTHER): Admitting: Medical Oncology

## 2023-12-24 ENCOUNTER — Inpatient Hospital Stay: Attending: Medical Oncology

## 2023-12-24 ENCOUNTER — Encounter: Payer: Self-pay | Admitting: Medical Oncology

## 2023-12-24 VITALS — BP 142/35 | HR 63 | Temp 98.2°F | Resp 19 | Ht 66.0 in | Wt 178.0 lb

## 2023-12-24 DIAGNOSIS — D649 Anemia, unspecified: Secondary | ICD-10-CM | POA: Diagnosis not present

## 2023-12-24 DIAGNOSIS — D539 Nutritional anemia, unspecified: Secondary | ICD-10-CM | POA: Insufficient documentation

## 2023-12-24 DIAGNOSIS — D696 Thrombocytopenia, unspecified: Secondary | ICD-10-CM | POA: Diagnosis not present

## 2023-12-24 DIAGNOSIS — D72829 Elevated white blood cell count, unspecified: Secondary | ICD-10-CM

## 2023-12-24 DIAGNOSIS — Z853 Personal history of malignant neoplasm of breast: Secondary | ICD-10-CM | POA: Diagnosis not present

## 2023-12-24 DIAGNOSIS — R5383 Other fatigue: Secondary | ICD-10-CM | POA: Diagnosis not present

## 2023-12-24 DIAGNOSIS — Z923 Personal history of irradiation: Secondary | ICD-10-CM | POA: Insufficient documentation

## 2023-12-24 DIAGNOSIS — N189 Chronic kidney disease, unspecified: Secondary | ICD-10-CM | POA: Insufficient documentation

## 2023-12-24 DIAGNOSIS — R768 Other specified abnormal immunological findings in serum: Secondary | ICD-10-CM | POA: Diagnosis not present

## 2023-12-24 DIAGNOSIS — Z79899 Other long term (current) drug therapy: Secondary | ICD-10-CM | POA: Diagnosis not present

## 2023-12-24 DIAGNOSIS — I129 Hypertensive chronic kidney disease with stage 1 through stage 4 chronic kidney disease, or unspecified chronic kidney disease: Secondary | ICD-10-CM | POA: Insufficient documentation

## 2023-12-24 DIAGNOSIS — E785 Hyperlipidemia, unspecified: Secondary | ICD-10-CM | POA: Insufficient documentation

## 2023-12-24 DIAGNOSIS — D75839 Thrombocytosis, unspecified: Secondary | ICD-10-CM

## 2023-12-24 LAB — CBC WITH DIFFERENTIAL (CANCER CENTER ONLY)
Abs Immature Granulocytes: 0.03 10*3/uL (ref 0.00–0.07)
Basophils Absolute: 0 10*3/uL (ref 0.0–0.1)
Basophils Relative: 0 %
Eosinophils Absolute: 0.1 10*3/uL (ref 0.0–0.5)
Eosinophils Relative: 3 %
HCT: 23.8 % — ABNORMAL LOW (ref 36.0–46.0)
Hemoglobin: 8 g/dL — ABNORMAL LOW (ref 12.0–15.0)
Immature Granulocytes: 1 %
Lymphocytes Relative: 36 %
Lymphs Abs: 1.2 10*3/uL (ref 0.7–4.0)
MCH: 37 pg — ABNORMAL HIGH (ref 26.0–34.0)
MCHC: 33.6 g/dL (ref 30.0–36.0)
MCV: 110.2 fL — ABNORMAL HIGH (ref 80.0–100.0)
Monocytes Absolute: 0.3 10*3/uL (ref 0.1–1.0)
Monocytes Relative: 8 %
Neutro Abs: 1.7 10*3/uL (ref 1.7–7.7)
Neutrophils Relative %: 52 %
Platelet Count: 74 10*3/uL — ABNORMAL LOW (ref 150–400)
RBC: 2.16 MIL/uL — ABNORMAL LOW (ref 3.87–5.11)
RDW: 14.3 % (ref 11.5–15.5)
Smear Review: NORMAL
WBC Count: 3.2 10*3/uL — ABNORMAL LOW (ref 4.0–10.5)
nRBC: 0 % (ref 0.0–0.2)

## 2023-12-24 LAB — CMP (CANCER CENTER ONLY)
ALT: 8 U/L (ref 0–44)
AST: 13 U/L — ABNORMAL LOW (ref 15–41)
Albumin: 4.2 g/dL (ref 3.5–5.0)
Alkaline Phosphatase: 58 U/L (ref 38–126)
Anion gap: 8 (ref 5–15)
BUN: 23 mg/dL (ref 8–23)
CO2: 28 mmol/L (ref 22–32)
Calcium: 8.8 mg/dL — ABNORMAL LOW (ref 8.9–10.3)
Chloride: 106 mmol/L (ref 98–111)
Creatinine: 1.59 mg/dL — ABNORMAL HIGH (ref 0.44–1.00)
GFR, Estimated: 32 mL/min — ABNORMAL LOW (ref >60)
Glucose, Bld: 129 mg/dL — ABNORMAL HIGH (ref 70–99)
Potassium: 3.6 mmol/L (ref 3.5–5.1)
Sodium: 142 mmol/L (ref 135–145)
Total Bilirubin: 0.5 mg/dL (ref 0.0–1.2)
Total Protein: 6.5 g/dL (ref 6.5–8.1)

## 2023-12-24 LAB — SAMPLE TO BLOOD BANK

## 2023-12-24 LAB — VITAMIN B12: Vitamin B-12: 178 pg/mL — ABNORMAL LOW (ref 180–914)

## 2023-12-24 LAB — LACTATE DEHYDROGENASE: LDH: 90 U/L — ABNORMAL LOW (ref 98–192)

## 2023-12-24 NOTE — Progress Notes (Signed)
 Hematology and Oncology Follow Up Visit  Doris Mcdaniel [id number] [birth year] 88 y.o. 12/24/2023  Past Medical History:  Diagnosis Date   Abnormal x-ray of spine    Mild Compression deformity of the the T8 vertebral body, age indertiminate    Allergic rhinitis    GERD (gastroesophageal reflux disease)    History of breast cancer 2012   left breast   Hyperlipidemia    Hypertension    Personal history of radiation therapy     Principle Diagnosis:  - Anemia CKD - Thrombocytopenia   - Breast Cancer  - Left Breast- Treated with XRT- Pt unable to give additional information  Current Therapy:   Work up phase B12 1,000 mcg once daily      Interim History:  Doris Mcdaniel is back for follow-up and her second visit to our office.   Anemia started about 2 years ago with a value of 11.9 hgb. She then started to have some neutropenia as well which was noticed 4 months ago. Her last set of blood work 8 days ago showed a WBC of 3.4, ANC of 1.7, Hgb of 8.7, MCV of 108.7, platelets of 87. Creatinine 1.52, calcium  of 8.3     She discussed with me at her initial visit that she would be agreeable to lab work but wanted to hold off on any procedures such as a bone marrow biopsy. I have confirmed this with her today and she again does not want any procedures completed for work up.   Today she reports that she continues to feel fatigued. She has not started the B12 as we were unable to contact her successfully regarding her labs.   There has been no bleeding to her knowledge: denies epistaxis, gingivitis, hemoptysis, hematemesis, hematuria, melena, excessive bruising, blood donation.    She denies SOB, chest pain.  She is up one pound since starting an Ensure once daily.  Wt Readings from Last 3 Encounters:  12/24/23 178 lb (80.7 kg)  12/10/23 177 lb 6.4 oz (80.5 kg)  12/02/23 178 lb 2 oz (80.8 kg)     Medications:   Current Outpatient Medications:    acetaminophen  (TYLENOL ) 500 MG  tablet, Take 2 tablets (1,000 mg total) by mouth 2 (two) times daily as needed for mild pain or moderate pain., Disp: 30 tablet, Rfl: 0   alendronate  (FOSAMAX ) 70 MG tablet, Take 1 tablet (70 mg total) by mouth every 7 (seven) days. Take with a full glass of water on an empty stomach., Disp: 12 tablet, Rfl: 3   atorvastatin  (LIPITOR) 20 MG tablet, Take 1 tablet (20 mg total) by mouth at bedtime., Disp: 90 tablet, Rfl: 1   CALCIUM -VITAMIN D PO, Take by mouth., Disp: , Rfl:    carboxymethylcellulose (REFRESH PLUS) 0.5 % SOLN, Place 1 drop into both eyes daily as needed., Disp: , Rfl:    hydrochlorothiazide  (HYDRODIURIL ) 25 MG tablet, Take 1 tablet (25 mg total) by mouth daily., Disp: 90 tablet, Rfl: 1   lisinopril  (ZESTRIL ) 20 MG tablet, Take 1 tablet (20 mg total) by mouth daily., Disp: 90 tablet, Rfl: 1   loratadine  (CLARITIN ) 10 MG tablet, Take 1 tablet (10 mg total) by mouth daily., Disp: 90 tablet, Rfl: 3   Multiple Vitamin (MULTIVITAMIN) capsule, Take 1 capsule by mouth daily., Disp: , Rfl:    Multiple Vitamins-Minerals (PRESERVISION AREDS PO), Take by mouth., Disp: , Rfl:    verapamil  (CALAN -SR) 240 MG CR tablet, Take 1 tablet (240 mg total) by mouth at  bedtime., Disp: 90 tablet, Rfl: 1  Allergies: No Known Allergies  Past Medical History, Surgical history, Social history, and Family History were reviewed and updated.  Review of Systems: Review of Systems  Constitutional: Negative.   HENT:  Negative.    Eyes: Negative.   Respiratory: Negative.    Cardiovascular: Negative.   Gastrointestinal: Negative.   Endocrine: Negative.   Genitourinary: Negative.    Musculoskeletal: Negative.   Skin: Negative.   Neurological: Negative.   Hematological: Negative.   Psychiatric/Behavioral: Negative.     Physical Exam:  height is 5\' 6"  (1.676 m) and weight is 178 lb (80.7 kg). Her oral temperature is 98.2 F (36.8 C). Her blood pressure is 142/35 (abnormal) and her pulse is 63. Her respiration  is 19 and oxygen saturation is 99%.   Physical Exam General: NAD Cardiovascular: regular rate and rhythm Pulmonary: clear ant fields Abdomen: soft, nontender, + bowel sounds GU: no suprapubic tenderness Extremities: no edema, no joint deformities Skin: no rashes Neurological: Weakness but otherwise nonfocal   Lab Results  Component Value Date   WBC 3.2 (L) 12/24/2023   HGB 8.0 (L) 12/24/2023   HCT 23.8 (L) 12/24/2023   MCV 110.2 (H) 12/24/2023   PLT 74 (L) 12/24/2023     Chemistry      Component Value Date/Time   NA 142 12/24/2023 1037   K 3.6 12/24/2023 1037   CL 106 12/24/2023 1037   CO2 28 12/24/2023 1037   BUN 23 12/24/2023 1037   CREATININE 1.59 (H) 12/24/2023 1037      Component Value Date/Time   CALCIUM  8.8 (L) 12/24/2023 1037   ALKPHOS 58 12/24/2023 1037   AST 13 (L) 12/24/2023 1037   ALT 8 12/24/2023 1037   BILITOT 0.5 12/24/2023 1037     Encounter Diagnoses  Name Primary?   Thrombocytopenia (HCC) Yes   Macrocytic anemia    Symptomatic anemia    Leukocytosis, unspecified type    Elevated serum immunoglobulin free light chains     Assessment and Plan- Patient is a 88 y.o. female who is followed by our office for thrombocytopenia, neutropenia, macrocytic anemia and elevated free light chains.   She will start B12 today- 1,000-2,000 once daily over the counter No blood needed today/tomorrow however she will be seen for follow up in 1-2 weeks for recheck and consideration of blood products. With B12 supplementation and no known current bleeding, it is expected that her Hgb will improve over the next two weeks-this was discussed with patient. Red flags reviewed which would indicate that she would need a sooner appointment.  Light chains likely elevated due to CKD however we will monitor and order UPEP as well.   Goal will be to maximize her Hgb levels and continue work up. At this time she continues to elect to avoid any procedures     Disposition: Start  B12 1,000 mcg- 2,000 mcg once daily No blood tomorrow RTC 2 weeks APP, labs Day 1 RTC 1 unit of blood Day 2   Sunnie England PA-C 5/13/20253:44 PM

## 2023-12-25 ENCOUNTER — Inpatient Hospital Stay

## 2023-12-25 ENCOUNTER — Ambulatory Visit: Payer: Self-pay | Admitting: Medical Oncology

## 2023-12-25 LAB — KAPPA/LAMBDA LIGHT CHAINS
Kappa free light chain: 42.6 mg/L — ABNORMAL HIGH (ref 3.3–19.4)
Kappa, lambda light chain ratio: 1.95 — ABNORMAL HIGH (ref 0.26–1.65)
Lambda free light chains: 21.9 mg/L (ref 5.7–26.3)

## 2023-12-25 NOTE — Telephone Encounter (Signed)
 Per Sunnie England, PA pt informed of the following: "B12 is low as we discussed at her visit- please ensure she has started her B12 supplement.  Continue the Ensure 1-2 times per day along with meals and regular hydration We will continue to follow her labs to ensure B12 improves them"  Pt verbalized understanding and had no further questions. Pt aware of next appointment date and time.

## 2023-12-25 NOTE — Telephone Encounter (Signed)
-----   Message from Sharla Davis sent at 12/25/2023  4:02 PM EDT ----- Please call home phone and cell phone with results.  B12 is low as we discussed at her visit- please ensure she has started her B12 supplement.  Continue the Ensure 1-2 times per day along with meals and regular hydration We will continue to follow her labs to ensure B12 improves them

## 2024-01-07 ENCOUNTER — Inpatient Hospital Stay

## 2024-01-07 ENCOUNTER — Other Ambulatory Visit: Payer: Self-pay | Admitting: *Deleted

## 2024-01-07 ENCOUNTER — Encounter: Payer: Self-pay | Admitting: Medical Oncology

## 2024-01-07 ENCOUNTER — Inpatient Hospital Stay (HOSPITAL_BASED_OUTPATIENT_CLINIC_OR_DEPARTMENT_OTHER): Admitting: Medical Oncology

## 2024-01-07 VITALS — BP 132/39 | HR 59 | Temp 98.1°F | Resp 18 | Ht 66.0 in | Wt 178.8 lb

## 2024-01-07 DIAGNOSIS — D696 Thrombocytopenia, unspecified: Secondary | ICD-10-CM

## 2024-01-07 DIAGNOSIS — D72829 Elevated white blood cell count, unspecified: Secondary | ICD-10-CM

## 2024-01-07 DIAGNOSIS — D75839 Thrombocytosis, unspecified: Secondary | ICD-10-CM

## 2024-01-07 DIAGNOSIS — D539 Nutritional anemia, unspecified: Secondary | ICD-10-CM

## 2024-01-07 DIAGNOSIS — R768 Other specified abnormal immunological findings in serum: Secondary | ICD-10-CM

## 2024-01-07 DIAGNOSIS — D649 Anemia, unspecified: Secondary | ICD-10-CM

## 2024-01-07 DIAGNOSIS — R7689 Other specified abnormal immunological findings in serum: Secondary | ICD-10-CM

## 2024-01-07 LAB — CBC WITH DIFFERENTIAL (CANCER CENTER ONLY)
Abs Immature Granulocytes: 0.02 10*3/uL (ref 0.00–0.07)
Basophils Absolute: 0 10*3/uL (ref 0.0–0.1)
Basophils Relative: 0 %
Eosinophils Absolute: 0.1 10*3/uL (ref 0.0–0.5)
Eosinophils Relative: 5 %
HCT: 23.6 % — ABNORMAL LOW (ref 36.0–46.0)
Hemoglobin: 7.9 g/dL — ABNORMAL LOW (ref 12.0–15.0)
Immature Granulocytes: 1 %
Lymphocytes Relative: 37 %
Lymphs Abs: 1.1 10*3/uL (ref 0.7–4.0)
MCH: 36.7 pg — ABNORMAL HIGH (ref 26.0–34.0)
MCHC: 33.5 g/dL (ref 30.0–36.0)
MCV: 109.8 fL — ABNORMAL HIGH (ref 80.0–100.0)
Monocytes Absolute: 0.3 10*3/uL (ref 0.1–1.0)
Monocytes Relative: 10 %
Neutro Abs: 1.4 10*3/uL — ABNORMAL LOW (ref 1.7–7.7)
Neutrophils Relative %: 47 %
Platelet Count: 71 10*3/uL — ABNORMAL LOW (ref 150–400)
RBC: 2.15 MIL/uL — ABNORMAL LOW (ref 3.87–5.11)
RDW: 14.7 % (ref 11.5–15.5)
WBC Count: 2.8 10*3/uL — ABNORMAL LOW (ref 4.0–10.5)
nRBC: 0 % (ref 0.0–0.2)

## 2024-01-07 LAB — CMP (CANCER CENTER ONLY)
ALT: 9 U/L (ref 0–44)
AST: 15 U/L (ref 15–41)
Albumin: 4.4 g/dL (ref 3.5–5.0)
Alkaline Phosphatase: 58 U/L (ref 38–126)
Anion gap: 9 (ref 5–15)
BUN: 30 mg/dL — ABNORMAL HIGH (ref 8–23)
CO2: 26 mmol/L (ref 22–32)
Calcium: 8.9 mg/dL (ref 8.9–10.3)
Chloride: 105 mmol/L (ref 98–111)
Creatinine: 1.48 mg/dL — ABNORMAL HIGH (ref 0.44–1.00)
GFR, Estimated: 34 mL/min — ABNORMAL LOW (ref >60)
Glucose, Bld: 116 mg/dL — ABNORMAL HIGH (ref 70–99)
Potassium: 4.2 mmol/L (ref 3.5–5.1)
Sodium: 140 mmol/L (ref 135–145)
Total Bilirubin: 0.7 mg/dL (ref 0.0–1.2)
Total Protein: 6.4 g/dL — ABNORMAL LOW (ref 6.5–8.1)

## 2024-01-07 LAB — SAMPLE TO BLOOD BANK

## 2024-01-07 LAB — VITAMIN B12: Vitamin B-12: 872 pg/mL (ref 180–914)

## 2024-01-07 NOTE — Progress Notes (Signed)
 Hematology and Oncology Follow Up Visit  Doris Mcdaniel [id number] [birth year] 88 y.o. 01/07/2024  Past Medical History:  Diagnosis Date   Abnormal x-ray of spine    Mild Compression deformity of the the T8 vertebral body, age indertiminate    Allergic rhinitis    GERD (gastroesophageal reflux disease)    History of breast cancer 2012   left breast   Hyperlipidemia    Hypertension    Personal history of radiation therapy     Principle Diagnosis:  - Anemia CKD - Thrombocytopenia   - Breast Cancer  - Left Breast- Treated with XRT- Pt unable to give additional information  Current Therapy:   Work up phase B12 1,000 mcg twice daily      Interim History:  Doris Mcdaniel is back for follow-up and her second visit to our office.   Anemia started about 2 years ago with a value of 11.9 hgb. She then started to have some neutropenia as well which was noticed 4 months ago. Her last set of blood work 8 days ago showed a WBC of 3.4, ANC of 1.7, Hgb of 8.7, MCV of 108.7, platelets of 87. Creatinine 1.52, calcium  of 8.3     She discussed with me at her initial visit that she would be agreeable to lab work but wanted to hold off on any procedures such as a bone marrow biopsy.   Today she reports that she continues to feel fatigued. Her fatigue has not worsened since her last visit.   She reports that she is taking her oral B12 1,000 twice daily. She has been tolerating it well. She has not noticed any improvements in symptoms.   There has been no bleeding to her knowledge: denies epistaxis, gingivitis, hemoptysis, hematemesis, hematuria, melena, excessive bruising, blood donation.    She denies SOB, chest pain.  No new bone pains, significantly worsened fatigue Weight is stable. She is using one Ensure once daily.   Wt Readings from Last 3 Encounters:  01/07/24 178 lb 12.8 oz (81.1 kg)  12/24/23 178 lb (80.7 kg)  12/10/23 177 lb 6.4 oz (80.5 kg)     Medications:   Current  Outpatient Medications:    acetaminophen  (TYLENOL ) 500 MG tablet, Take 2 tablets (1,000 mg total) by mouth 2 (two) times daily as needed for mild pain or moderate pain., Disp: 30 tablet, Rfl: 0   alendronate  (FOSAMAX ) 70 MG tablet, Take 1 tablet (70 mg total) by mouth every 7 (seven) days. Take with a full glass of water on an empty stomach., Disp: 12 tablet, Rfl: 3   atorvastatin  (LIPITOR) 20 MG tablet, Take 1 tablet (20 mg total) by mouth at bedtime., Disp: 90 tablet, Rfl: 1   CALCIUM -VITAMIN D PO, Take by mouth., Disp: , Rfl:    carboxymethylcellulose (REFRESH PLUS) 0.5 % SOLN, Place 1 drop into both eyes daily as needed., Disp: , Rfl:    hydrochlorothiazide  (HYDRODIURIL ) 25 MG tablet, Take 1 tablet (25 mg total) by mouth daily., Disp: 90 tablet, Rfl: 1   lisinopril  (ZESTRIL ) 20 MG tablet, Take 1 tablet (20 mg total) by mouth daily., Disp: 90 tablet, Rfl: 1   loratadine  (CLARITIN ) 10 MG tablet, Take 1 tablet (10 mg total) by mouth daily., Disp: 90 tablet, Rfl: 3   Multiple Vitamin (MULTIVITAMIN) capsule, Take 1 capsule by mouth daily., Disp: , Rfl:    Multiple Vitamins-Minerals (PRESERVISION AREDS PO), Take by mouth., Disp: , Rfl:    verapamil  (CALAN -SR) 240 MG CR tablet, Take  1 tablet (240 mg total) by mouth at bedtime., Disp: 90 tablet, Rfl: 1  Allergies: No Known Allergies  Past Medical History, Surgical history, Social history, and Family History were reviewed and updated.  Review of Systems: Review of Systems  Constitutional: Negative.   HENT:  Negative.    Eyes: Negative.   Respiratory: Negative.    Cardiovascular: Negative.   Gastrointestinal: Negative.   Endocrine: Negative.   Genitourinary: Negative.    Musculoskeletal: Negative.   Skin: Negative.   Neurological: Negative.   Hematological: Negative.   Psychiatric/Behavioral: Negative.     Physical Exam:  height is 5\' 6"  (1.676 m) and weight is 178 lb 12.8 oz (81.1 kg). Her oral temperature is 98.1 F (36.7 C). Her blood  pressure is 132/39 (abnormal) and her pulse is 59 (abnormal). Her respiration is 18 and oxygen saturation is 100%.   Physical Exam General: NAD Cardiovascular: regular rate and rhythm Pulmonary: clear ant fields Abdomen: soft, nontender, + bowel sounds GU: no suprapubic tenderness Extremities: no edema, no joint deformities Skin: no rashes Neurological: Weakness but otherwise nonfocal   Lab Results  Component Value Date   WBC 2.8 (L) 01/07/2024   HGB 7.9 (L) 01/07/2024   HCT 23.6 (L) 01/07/2024   MCV 109.8 (H) 01/07/2024   PLT 71 (L) 01/07/2024     Chemistry      Component Value Date/Time   NA 140 01/07/2024 1055   K 4.2 01/07/2024 1055   CL 105 01/07/2024 1055   CO2 26 01/07/2024 1055   BUN 30 (H) 01/07/2024 1055   CREATININE 1.48 (H) 01/07/2024 1055      Component Value Date/Time   CALCIUM  8.9 01/07/2024 1055   ALKPHOS 58 01/07/2024 1055   AST 15 01/07/2024 1055   ALT 9 01/07/2024 1055   BILITOT 0.7 01/07/2024 1055     No diagnosis found.   Assessment and Plan- Patient is a 88 y.o. female who is followed by our office for thrombocytopenia, neutropenia, macrocytic anemia and elevated free light chains.   I have reviewed her CBC and CMP with her today. We have discussed her low Hgb level and complications this can cause including death.  I have offered her a blood transfusion however she declines.  She will continue her B12 supplementation- if B12 has not improved above 200 since starting the OTC supplementation I have suggested IM injections which she reports that she will consider but likely will not wish to have.  UPEP pending Again she declines a bone marrow biopsy for further evaluation. I respect her autonomy to decline any procedures. We will assess an MDS panel at next visit.   Red flags reviewed which would indicate that she would need a sooner appointment. She is going out of town and will be back in 3 weeks.   Disposition: No blood tomorrow RTC 3 weeks  APP, labs Day 1 RTC 1 unit of blood Day 2   Sunnie England PA-C 5/27/202511:50 AM

## 2024-01-08 ENCOUNTER — Encounter

## 2024-01-08 ENCOUNTER — Ambulatory Visit: Payer: Self-pay | Admitting: Medical Oncology

## 2024-01-08 NOTE — Telephone Encounter (Signed)
-----   Message from Sharla Davis sent at 01/08/2024  2:38 PM EDT ----- Please call to let her know that her B12 is looking much better. Keep taking the B12 supplement.

## 2024-01-08 NOTE — Telephone Encounter (Signed)
 LMOM advising pt of lab results.

## 2024-01-09 LAB — UPEP/UIFE/LIGHT CHAINS/TP, 24-HR UR
% BETA, Urine: 0 %
ALPHA 1 URINE: 0 %
Albumin, U: 100 %
Alpha 2, Urine: 0 %
Free Kappa Lt Chains,Ur: 9.58 mg/L (ref 1.17–86.46)
Free Kappa/Lambda Ratio: 7.73 (ref 1.83–14.26)
Free Lambda Lt Chains,Ur: 1.24 mg/L (ref 0.27–15.21)
GAMMA GLOBULIN URINE: 0 %
Total Protein, Urine-Ur/day: 59 mg/(24.h) (ref 30–150)
Total Protein, Urine: 4.2 mg/dL
Total Volume: 1400

## 2024-01-24 ENCOUNTER — Other Ambulatory Visit: Payer: Self-pay | Admitting: Internal Medicine

## 2024-01-27 DIAGNOSIS — H353122 Nonexudative age-related macular degeneration, left eye, intermediate dry stage: Secondary | ICD-10-CM | POA: Diagnosis not present

## 2024-01-27 DIAGNOSIS — H353211 Exudative age-related macular degeneration, right eye, with active choroidal neovascularization: Secondary | ICD-10-CM | POA: Diagnosis not present

## 2024-01-27 DIAGNOSIS — H35033 Hypertensive retinopathy, bilateral: Secondary | ICD-10-CM | POA: Diagnosis not present

## 2024-01-27 DIAGNOSIS — Z961 Presence of intraocular lens: Secondary | ICD-10-CM | POA: Diagnosis not present

## 2024-01-27 DIAGNOSIS — H43813 Vitreous degeneration, bilateral: Secondary | ICD-10-CM | POA: Diagnosis not present

## 2024-01-28 ENCOUNTER — Inpatient Hospital Stay (HOSPITAL_BASED_OUTPATIENT_CLINIC_OR_DEPARTMENT_OTHER): Admitting: Medical Oncology

## 2024-01-28 ENCOUNTER — Encounter: Payer: Self-pay | Admitting: Medical Oncology

## 2024-01-28 ENCOUNTER — Other Ambulatory Visit: Payer: Self-pay | Admitting: Medical Oncology

## 2024-01-28 ENCOUNTER — Inpatient Hospital Stay: Attending: Medical Oncology

## 2024-01-28 VITALS — BP 137/40 | HR 59 | Temp 97.8°F | Resp 19 | Ht 66.0 in | Wt 177.0 lb

## 2024-01-28 DIAGNOSIS — E8581 Light chain (AL) amyloidosis: Secondary | ICD-10-CM | POA: Diagnosis not present

## 2024-01-28 DIAGNOSIS — D75839 Thrombocytosis, unspecified: Secondary | ICD-10-CM

## 2024-01-28 DIAGNOSIS — D539 Nutritional anemia, unspecified: Secondary | ICD-10-CM

## 2024-01-28 DIAGNOSIS — Z923 Personal history of irradiation: Secondary | ICD-10-CM | POA: Diagnosis not present

## 2024-01-28 DIAGNOSIS — D631 Anemia in chronic kidney disease: Secondary | ICD-10-CM | POA: Diagnosis not present

## 2024-01-28 DIAGNOSIS — D72829 Elevated white blood cell count, unspecified: Secondary | ICD-10-CM

## 2024-01-28 DIAGNOSIS — N189 Chronic kidney disease, unspecified: Secondary | ICD-10-CM | POA: Insufficient documentation

## 2024-01-28 DIAGNOSIS — E785 Hyperlipidemia, unspecified: Secondary | ICD-10-CM | POA: Diagnosis not present

## 2024-01-28 DIAGNOSIS — D696 Thrombocytopenia, unspecified: Secondary | ICD-10-CM | POA: Insufficient documentation

## 2024-01-28 DIAGNOSIS — I129 Hypertensive chronic kidney disease with stage 1 through stage 4 chronic kidney disease, or unspecified chronic kidney disease: Secondary | ICD-10-CM | POA: Insufficient documentation

## 2024-01-28 DIAGNOSIS — D649 Anemia, unspecified: Secondary | ICD-10-CM

## 2024-01-28 DIAGNOSIS — R768 Other specified abnormal immunological findings in serum: Secondary | ICD-10-CM

## 2024-01-28 DIAGNOSIS — D709 Neutropenia, unspecified: Secondary | ICD-10-CM | POA: Diagnosis not present

## 2024-01-28 DIAGNOSIS — Z79899 Other long term (current) drug therapy: Secondary | ICD-10-CM | POA: Insufficient documentation

## 2024-01-28 LAB — CMP (CANCER CENTER ONLY)
ALT: 16 U/L (ref 0–44)
AST: 12 U/L — ABNORMAL LOW (ref 15–41)
Albumin: 4.2 g/dL (ref 3.5–5.0)
Alkaline Phosphatase: 61 U/L (ref 38–126)
Anion gap: 9 (ref 5–15)
BUN: 24 mg/dL — ABNORMAL HIGH (ref 8–23)
CO2: 27 mmol/L (ref 22–32)
Calcium: 9.4 mg/dL (ref 8.9–10.3)
Chloride: 105 mmol/L (ref 98–111)
Creatinine: 1.44 mg/dL — ABNORMAL HIGH (ref 0.44–1.00)
GFR, Estimated: 36 mL/min — ABNORMAL LOW (ref >60)
Glucose, Bld: 104 mg/dL — ABNORMAL HIGH (ref 70–99)
Potassium: 3.7 mmol/L (ref 3.5–5.1)
Sodium: 141 mmol/L (ref 135–145)
Total Bilirubin: 0.6 mg/dL (ref 0.0–1.2)
Total Protein: 6.6 g/dL (ref 6.5–8.1)

## 2024-01-28 LAB — LACTATE DEHYDROGENASE: LDH: 85 U/L — ABNORMAL LOW (ref 98–192)

## 2024-01-28 LAB — FERRITIN: Ferritin: 420 ng/mL — ABNORMAL HIGH (ref 11–307)

## 2024-01-28 LAB — CBC WITH DIFFERENTIAL (CANCER CENTER ONLY)
Abs Immature Granulocytes: 0.01 10*3/uL (ref 0.00–0.07)
Basophils Absolute: 0 10*3/uL (ref 0.0–0.1)
Basophils Relative: 0 %
Eosinophils Absolute: 0.1 10*3/uL (ref 0.0–0.5)
Eosinophils Relative: 5 %
HCT: 23.4 % — ABNORMAL LOW (ref 36.0–46.0)
Hemoglobin: 7.6 g/dL — ABNORMAL LOW (ref 12.0–15.0)
Immature Granulocytes: 0 %
Lymphocytes Relative: 47 %
Lymphs Abs: 1.2 10*3/uL (ref 0.7–4.0)
MCH: 36.9 pg — ABNORMAL HIGH (ref 26.0–34.0)
MCHC: 32.5 g/dL (ref 30.0–36.0)
MCV: 113.6 fL — ABNORMAL HIGH (ref 80.0–100.0)
Monocytes Absolute: 0.2 10*3/uL (ref 0.1–1.0)
Monocytes Relative: 9 %
Neutro Abs: 1 10*3/uL — ABNORMAL LOW (ref 1.7–7.7)
Neutrophils Relative %: 39 %
Platelet Count: 70 10*3/uL — ABNORMAL LOW (ref 150–400)
RBC: 2.06 MIL/uL — ABNORMAL LOW (ref 3.87–5.11)
RDW: 14.9 % (ref 11.5–15.5)
Smear Review: NORMAL
WBC Count: 2.6 10*3/uL — ABNORMAL LOW (ref 4.0–10.5)
nRBC: 0 % (ref 0.0–0.2)

## 2024-01-28 LAB — IRON AND IRON BINDING CAPACITY (CC-WL,HP ONLY)
Iron: 123 ug/dL (ref 28–170)
Saturation Ratios: 49 % — ABNORMAL HIGH (ref 10.4–31.8)
TIBC: 249 ug/dL — ABNORMAL LOW (ref 250–450)
UIBC: 126 ug/dL — ABNORMAL LOW (ref 148–442)

## 2024-01-28 LAB — RETIC PANEL
Immature Retic Fract: 27.7 % — ABNORMAL HIGH (ref 2.3–15.9)
RBC.: 2.06 MIL/uL — ABNORMAL LOW (ref 3.87–5.11)
Retic Count, Absolute: 62.8 10*3/uL (ref 19.0–186.0)
Retic Ct Pct: 3.1 % (ref 0.4–3.1)
Reticulocyte Hemoglobin: 37.5 pg (ref >27.9)

## 2024-01-28 LAB — SAMPLE TO BLOOD BANK

## 2024-01-28 LAB — VITAMIN B12: Vitamin B-12: 1106 pg/mL — ABNORMAL HIGH (ref 180–914)

## 2024-01-28 NOTE — Progress Notes (Signed)
 Hematology and Oncology Follow Up Visit  Doris Mcdaniel [id number] [birth year] 88 y.o. 01/28/2024  Past Medical History:  Diagnosis Date   Abnormal x-ray of spine    Mild Compression deformity of the the T8 vertebral body, age indertiminate    Allergic rhinitis    GERD (gastroesophageal reflux disease)    History of breast cancer 2012   left breast   Hyperlipidemia    Hypertension    Personal history of radiation therapy     Principle Diagnosis:  - Anemia CKD - Thrombocytopenia   - Breast Cancer  - Left Breast- Treated with XRT- Pt unable to give additional information  Current Therapy:   Work up phase B12 1,000 mcg twice daily      Interim History:  Doris Mcdaniel is back for follow-up. She is here with her husband.    Anemia started about 2 years ago with a value of 11.9 hgb. She then started to have some neutropenia as well which was noticed 4 months ago. Her last set of blood work 8 days ago showed a WBC of 3.4, ANC of 1.7, Hgb of 8.7, MCV of 108.7, platelets of 87. Creatinine 1.52, calcium  of 8.3     She discussed with me at her initial visit that she would be agreeable to lab work but wanted to hold off on any procedures such as a bone marrow biopsy. She also would like to hold off on blood transfusions or vitamin injections/infusions.   Today she reports that she has been taking her oral B12 and iron supplement. She has tolerated these well. She reports feeling a bit better than at her last visit. She thinks that she has a bit more stamina.   There has been no bleeding to her knowledge: denies epistaxis, gingivitis, hemoptysis, hematemesis, hematuria, melena, excessive bruising, blood donation.   No SOB, chest pain, peripheral edema, infections.   She not drinking ensure as previously suggested.   She denies SOB, chest pain.  No new bone pains, significantly worsened fatigue Weight is down one pound  Wt Readings from Last 3 Encounters:  01/28/24 177 lb 0.6  oz (80.3 kg)  01/07/24 178 lb 12.8 oz (81.1 kg)  12/24/23 178 lb (80.7 kg)     Medications:   Current Outpatient Medications:    acetaminophen  (TYLENOL ) 500 MG tablet, Take 2 tablets (1,000 mg total) by mouth 2 (two) times daily as needed for mild pain or moderate pain., Disp: 30 tablet, Rfl: 0   alendronate  (FOSAMAX ) 70 MG tablet, Take 1 tablet (70 mg total) by mouth every 7 (seven) days. Take with a full glass of water on an empty stomach., Disp: 12 tablet, Rfl: 3   atorvastatin  (LIPITOR) 20 MG tablet, Take 1 tablet (20 mg total) by mouth at bedtime., Disp: 90 tablet, Rfl: 1   CALCIUM -VITAMIN D PO, Take by mouth., Disp: , Rfl:    carboxymethylcellulose (REFRESH PLUS) 0.5 % SOLN, Place 1 drop into both eyes daily as needed., Disp: , Rfl:    cyanocobalamin  1000 MCG tablet, Take 1,000 mcg by mouth 2 (two) times daily., Disp: , Rfl:    ferrous sulfate 325 (65 FE) MG EC tablet, Take 325 mg by mouth 3 (three) times daily with meals., Disp: , Rfl:    hydrochlorothiazide  (HYDRODIURIL ) 25 MG tablet, Take 1 tablet (25 mg total) by mouth daily., Disp: 90 tablet, Rfl: 1   lisinopril  (ZESTRIL ) 20 MG tablet, Take 1 tablet (20 mg total) by mouth daily., Disp: 90 tablet, Rfl:  1   loratadine  (CLARITIN ) 10 MG tablet, Take 1 tablet (10 mg total) by mouth daily., Disp: 90 tablet, Rfl: 3   Multiple Vitamin (MULTIVITAMIN) capsule, Take 1 capsule by mouth daily., Disp: , Rfl:    Multiple Vitamins-Minerals (PRESERVISION AREDS PO), Take by mouth., Disp: , Rfl:    verapamil  (CALAN -SR) 240 MG CR tablet, Take 1 tablet (240 mg total) by mouth at bedtime., Disp: 90 tablet, Rfl: 1  Allergies: No Known Allergies  Past Medical History, Surgical history, Social history, and Family History were reviewed and updated.  Review of Systems: Review of Systems  Constitutional: Negative.   HENT:  Negative.    Eyes: Negative.   Respiratory: Negative.    Cardiovascular: Negative.   Gastrointestinal: Negative.   Endocrine:  Negative.   Genitourinary: Negative.    Musculoskeletal: Negative.   Skin: Negative.   Neurological: Negative.   Hematological: Negative.   Psychiatric/Behavioral: Negative.     Physical Exam:  height is 5' 6 (1.676 m) and weight is 177 lb 0.6 oz (80.3 kg). Her oral temperature is 97.8 F (36.6 C). Her blood pressure is 137/40 (abnormal) and her pulse is 59 (abnormal). Her respiration is 19 and oxygen saturation is 100%.   Physical Exam General: NAD. Alert and oriented to person, place, time and situation  HEENT: Conjunctiva with mild pallor Cardiovascular: regular rate and rhythm Pulmonary: clear ant fields Abdomen: soft, nontender Extremities: no edema, no joint deformities Skin: no rashes, no bruising noted  Neurological: Weakness but otherwise nonfocal   Lab Results  Component Value Date   WBC 2.8 (L) 01/07/2024   HGB 7.9 (L) 01/07/2024   HCT 23.6 (L) 01/07/2024   MCV 109.8 (H) 01/07/2024   PLT 71 (L) 01/07/2024     Chemistry      Component Value Date/Time   NA 140 01/07/2024 1055   K 4.2 01/07/2024 1055   CL 105 01/07/2024 1055   CO2 26 01/07/2024 1055   BUN 30 (H) 01/07/2024 1055   CREATININE 1.48 (H) 01/07/2024 1055      Component Value Date/Time   CALCIUM  8.9 01/07/2024 1055   ALKPHOS 58 01/07/2024 1055   AST 15 01/07/2024 1055   ALT 9 01/07/2024 1055   BILITOT 0.7 01/07/2024 1055     No diagnosis found.   Assessment and Plan- Patient is a 88 y.o. female who is followed by our office for thrombocytopenia, neutropenia, macrocytic anemia and elevated free light chains. She again makes it very clear to myself and her husband that she does not wish to have anything done that could cause pain or harm. She states that she feels as if her body is naturally shutting down and that she is ok with that. She confirms her treatment and evaluation options but declines.   She appears to have fully autonomy and I respect her wishes. I discussed with her and her  husband that I wanted to make sure that they were well informed that if they did not elect to treat her lab abnormalities, they would likely continue to worsen, leading eventually to death. At this time they are agreeable to MDS panel work up, and they will consider IV iron, B12 given risks/benefits.   I have reviewed her CBC and CMP with her today. Again we discussed that her low Hgb level could cause complications including death.  I have offered her a blood transfusion however she declines.  B12/iron labs pending. At this time she declined IM injections or IV iron. She  declines GI work up despite Music therapist.    Red flags reviewed which would indicate that she would need a sooner appointment.   She is going out of town and will be back in 4 weeks.    I spent 30 minutes dedicated to the care of this patient (face to face and non-face to face) on the date of this encounter to include the discussions above.    Disposition: No blood tomorrow RTC 3 weeks APP, labs Day 1 RTC 1 unit of blood Day 2   Stevi Hollinshead PA-C 6/17/202511:45 AM

## 2024-01-29 ENCOUNTER — Inpatient Hospital Stay

## 2024-02-03 ENCOUNTER — Ambulatory Visit: Payer: Self-pay | Admitting: Medical Oncology

## 2024-02-03 NOTE — Telephone Encounter (Signed)
-----   Message from Lauraine CHRISTELLA Dais sent at 02/03/2024 11:03 AM EDT ----- Please call and leave a message (mobile phone) to let them know that her iron and B12 levels have improved. Hopefully this may improve some of her CBC changes.  Her MDS (Myelodysplastic)panel is still pending  ----- Message ----- From: Interface, Lab In [place] Sent: 01/28/2024  11:21 AM EDT To: Lauraine CHRISTELLA Dais, PA-C

## 2024-02-03 NOTE — Telephone Encounter (Signed)
 Attempt to reach pt.  Called pt and left a message (mobile phone) to let them know that her iron and B12 levels have improved.  Hopefully this may improve some of her CBC changes.  Her MDS (Myelodysplastic)panel is still pending. Pt encouraged to call office with concerns.

## 2024-02-05 LAB — MYELODYSPLASTIC SYNDROME (MDS), FISH PANEL

## 2024-02-06 NOTE — Telephone Encounter (Signed)
-----   Message from Lauraine CHRISTELLA Dais sent at 02/06/2024  9:42 AM EDT ----- Please let them know that her MDS panel was normal.  ----- Message ----- From: Interface, Lab In [place] Sent: 01/28/2024  11:21 AM EDT To: Lauraine CHRISTELLA Dais, PA-C

## 2024-02-06 NOTE — Telephone Encounter (Signed)
 As noted below be Doris Mcdaniel, I informed her that Myelodysplastic Panel (MDS) was normal. Also, reviewed her CBC and iron studies. Reviewed her next appointments and time. She verbalized understanding.

## 2024-03-03 DIAGNOSIS — H353211 Exudative age-related macular degeneration, right eye, with active choroidal neovascularization: Secondary | ICD-10-CM | POA: Diagnosis not present

## 2024-03-10 ENCOUNTER — Inpatient Hospital Stay: Attending: Medical Oncology

## 2024-03-10 ENCOUNTER — Encounter: Payer: Self-pay | Admitting: Medical Oncology

## 2024-03-10 ENCOUNTER — Inpatient Hospital Stay: Admitting: Medical Oncology

## 2024-03-10 VITALS — BP 129/45 | HR 67 | Temp 98.1°F | Resp 20 | Ht 66.0 in | Wt 179.0 lb

## 2024-03-10 DIAGNOSIS — Z853 Personal history of malignant neoplasm of breast: Secondary | ICD-10-CM | POA: Diagnosis not present

## 2024-03-10 DIAGNOSIS — Z79899 Other long term (current) drug therapy: Secondary | ICD-10-CM | POA: Diagnosis not present

## 2024-03-10 DIAGNOSIS — I129 Hypertensive chronic kidney disease with stage 1 through stage 4 chronic kidney disease, or unspecified chronic kidney disease: Secondary | ICD-10-CM | POA: Insufficient documentation

## 2024-03-10 DIAGNOSIS — D649 Anemia, unspecified: Secondary | ICD-10-CM

## 2024-03-10 DIAGNOSIS — D72829 Elevated white blood cell count, unspecified: Secondary | ICD-10-CM | POA: Diagnosis not present

## 2024-03-10 DIAGNOSIS — N183 Chronic kidney disease, stage 3 unspecified: Secondary | ICD-10-CM | POA: Insufficient documentation

## 2024-03-10 DIAGNOSIS — R0602 Shortness of breath: Secondary | ICD-10-CM | POA: Insufficient documentation

## 2024-03-10 DIAGNOSIS — E785 Hyperlipidemia, unspecified: Secondary | ICD-10-CM | POA: Insufficient documentation

## 2024-03-10 DIAGNOSIS — D539 Nutritional anemia, unspecified: Secondary | ICD-10-CM | POA: Diagnosis not present

## 2024-03-10 DIAGNOSIS — R768 Other specified abnormal immunological findings in serum: Secondary | ICD-10-CM

## 2024-03-10 DIAGNOSIS — Z923 Personal history of irradiation: Secondary | ICD-10-CM | POA: Insufficient documentation

## 2024-03-10 DIAGNOSIS — E538 Deficiency of other specified B group vitamins: Secondary | ICD-10-CM | POA: Insufficient documentation

## 2024-03-10 DIAGNOSIS — D696 Thrombocytopenia, unspecified: Secondary | ICD-10-CM

## 2024-03-10 DIAGNOSIS — D75839 Thrombocytosis, unspecified: Secondary | ICD-10-CM

## 2024-03-10 LAB — CBC WITH DIFFERENTIAL (CANCER CENTER ONLY)
Abs Immature Granulocytes: 0 K/uL (ref 0.00–0.07)
Basophils Absolute: 0 K/uL (ref 0.0–0.1)
Basophils Relative: 0 %
Eosinophils Absolute: 0.1 K/uL (ref 0.0–0.5)
Eosinophils Relative: 3 %
HCT: 22.5 % — ABNORMAL LOW (ref 36.0–46.0)
Hemoglobin: 7.4 g/dL — ABNORMAL LOW (ref 12.0–15.0)
Immature Granulocytes: 0 %
Lymphocytes Relative: 42 %
Lymphs Abs: 1.2 K/uL (ref 0.7–4.0)
MCH: 37.8 pg — ABNORMAL HIGH (ref 26.0–34.0)
MCHC: 32.9 g/dL (ref 30.0–36.0)
MCV: 114.8 fL — ABNORMAL HIGH (ref 80.0–100.0)
Monocytes Absolute: 0.2 K/uL (ref 0.1–1.0)
Monocytes Relative: 7 %
Neutro Abs: 1.3 K/uL — ABNORMAL LOW (ref 1.7–7.7)
Neutrophils Relative %: 48 %
Platelet Count: 53 K/uL — ABNORMAL LOW (ref 150–400)
RBC: 1.96 MIL/uL — ABNORMAL LOW (ref 3.87–5.11)
RDW: 14.8 % (ref 11.5–15.5)
Smear Review: NORMAL
WBC Count: 2.8 K/uL — ABNORMAL LOW (ref 4.0–10.5)
nRBC: 0 % (ref 0.0–0.2)

## 2024-03-10 LAB — VITAMIN B12: Vitamin B-12: 2018 pg/mL — ABNORMAL HIGH (ref 180–914)

## 2024-03-10 LAB — CMP (CANCER CENTER ONLY)
ALT: 12 U/L (ref 0–44)
AST: 18 U/L (ref 15–41)
Albumin: 4.3 g/dL (ref 3.5–5.0)
Alkaline Phosphatase: 70 U/L (ref 38–126)
Anion gap: 13 (ref 5–15)
BUN: 24 mg/dL — ABNORMAL HIGH (ref 8–23)
CO2: 25 mmol/L (ref 22–32)
Calcium: 8.8 mg/dL — ABNORMAL LOW (ref 8.9–10.3)
Chloride: 105 mmol/L (ref 98–111)
Creatinine: 1.62 mg/dL — ABNORMAL HIGH (ref 0.44–1.00)
GFR, Estimated: 31 mL/min — ABNORMAL LOW (ref >60)
Glucose, Bld: 123 mg/dL — ABNORMAL HIGH (ref 70–99)
Potassium: 4 mmol/L (ref 3.5–5.1)
Sodium: 143 mmol/L (ref 135–145)
Total Bilirubin: 0.5 mg/dL (ref 0.0–1.2)
Total Protein: 6.7 g/dL (ref 6.5–8.1)

## 2024-03-10 LAB — FOLATE: Folate: >40 ng/mL (ref >5.9)

## 2024-03-10 LAB — IRON AND IRON BINDING CAPACITY (CC-WL,HP ONLY)
Iron: 123 ug/dL (ref 28–170)
Saturation Ratios: 46 % — ABNORMAL HIGH (ref 10.4–31.8)
TIBC: 270 ug/dL (ref 250–450)
UIBC: 147 ug/dL

## 2024-03-10 LAB — SAMPLE TO BLOOD BANK

## 2024-03-10 LAB — RETIC PANEL
Immature Retic Fract: 22.2 % — ABNORMAL HIGH (ref 2.3–15.9)
RBC.: 1.93 MIL/uL — ABNORMAL LOW (ref 3.87–5.11)
Retic Count, Absolute: 52.9 K/uL (ref 19.0–186.0)
Retic Ct Pct: 2.7 % (ref 0.4–3.1)
Reticulocyte Hemoglobin: 37.3 pg (ref >27.9)

## 2024-03-10 LAB — FERRITIN: Ferritin: 274 ng/mL (ref 11–307)

## 2024-03-10 NOTE — Progress Notes (Signed)
 Hematology and Oncology Follow Up Visit  Doris Mcdaniel [id number] 06-01-1936 88 y.o. 03/10/2024  Past Medical History:  Diagnosis Date   Abnormal x-ray of spine    Mild Compression deformity of the the T8 vertebral body, age indertiminate    Allergic rhinitis    GERD (gastroesophageal reflux disease)    History of breast cancer 2012   left breast   Hyperlipidemia    Hypertension    Personal history of radiation therapy     Principle Diagnosis:  - Anemia CKD Normal Erythropoietin  12/10/2023  Nutritional B12 Deficiency  - Thrombocytopenia   MDS FISH panel normal - 01/28/2024  BCR- ABL1 normal on 12/10/2023  Multiple Myeloma panel normal on 12/10/2023  UPEP normal on 01/07/2024  - Breast Cancer  - Left Breast- Treated with XRT- Pt unable to give additional information  Current Therapy:   Monitoring per patient request  B12 1,000 mcg twice daily      Interim History:  Doris Mcdaniel is back for follow-up. She is here with her husband.    Anemia started about 2 years ago with a value of 11.9 hgb. She then started to have some neutropenia as well which was noticed 4 months ago. Her last set of blood work 8 days ago showed a WBC of 3.4, ANC of 1.7, Hgb of 8.7, MCV of 108.7, platelets of 87. Creatinine 1.52, calcium  of 8.3     She discussed with me at her initial visit that she would be agreeable to lab work but wanted to hold off on any procedures such as a bone marrow biopsy. She also would like to hold off on blood transfusions or vitamin injections/infusions.   Today she states that she has experienced more fatigue recently. This include when active and resting. Now having some SOB as well. She continued to take her oral B12 and iron supplement. She has tolerated these well. She reports feeling a bit better than at her last visit. She thinks that she has a bit more stamina.   There has been no bleeding to her knowledge: denies epistaxis, gingivitis, hemoptysis,  hematemesis, hematuria, melena, excessive bruising, blood donation.   No chest pain, peripheral edema, infections.   No new bone pains, significantly worsened fatigue Weight is up 2 pounds   Wt Readings from Last 3 Encounters:  03/10/24 179 lb (81.2 kg)  01/28/24 177 lb 0.6 oz (80.3 kg)  01/07/24 178 lb 12.8 oz (81.1 kg)     Medications:   Current Outpatient Medications:    acetaminophen  (TYLENOL ) 500 MG tablet, Take 2 tablets (1,000 mg total) by mouth 2 (two) times daily as needed for mild pain or moderate pain., Disp: 30 tablet, Rfl: 0   alendronate  (FOSAMAX ) 70 MG tablet, Take 1 tablet (70 mg total) by mouth every 7 (seven) days. Take with a full glass of water on an empty stomach., Disp: 12 tablet, Rfl: 3   atorvastatin  (LIPITOR) 20 MG tablet, Take 1 tablet (20 mg total) by mouth at bedtime., Disp: 90 tablet, Rfl: 1   CALCIUM -VITAMIN D PO, Take by mouth., Disp: , Rfl:    carboxymethylcellulose (REFRESH PLUS) 0.5 % SOLN, Place 1 drop into both eyes daily as needed., Disp: , Rfl:    cyanocobalamin  1000 MCG tablet, Take 1,000 mcg by mouth 2 (two) times daily., Disp: , Rfl:    ferrous sulfate 325 (65 FE) MG EC tablet, Take 325 mg by mouth 3 (three) times daily with meals., Disp: , Rfl:    hydrochlorothiazide  (  HYDRODIURIL ) 25 MG tablet, Take 1 tablet (25 mg total) by mouth daily., Disp: 90 tablet, Rfl: 1   lisinopril  (ZESTRIL ) 20 MG tablet, Take 1 tablet (20 mg total) by mouth daily., Disp: 90 tablet, Rfl: 1   loratadine  (CLARITIN ) 10 MG tablet, Take 1 tablet (10 mg total) by mouth daily., Disp: 90 tablet, Rfl: 3   Multiple Vitamin (MULTIVITAMIN) capsule, Take 1 capsule by mouth daily., Disp: , Rfl:    Multiple Vitamins-Minerals (PRESERVISION AREDS PO), Take by mouth., Disp: , Rfl:    verapamil  (CALAN -SR) 240 MG CR tablet, Take 1 tablet (240 mg total) by mouth at bedtime., Disp: 90 tablet, Rfl: 1  Allergies: No Known Allergies  Past Medical History, Surgical history, Social history,  and Family History were reviewed and updated.  Review of Systems: Review of Systems  Constitutional: Negative.   HENT:  Negative.    Eyes: Negative.   Respiratory: Negative.    Cardiovascular: Negative.   Gastrointestinal: Negative.   Endocrine: Negative.   Genitourinary: Negative.    Musculoskeletal: Negative.   Skin: Negative.   Neurological: Negative.   Hematological: Negative.   Psychiatric/Behavioral: Negative.     Physical Exam:  height is 5' 6 (1.676 m) and weight is 179 lb (81.2 kg). Her oral temperature is 98.1 F (36.7 C). Her blood pressure is 129/45 (abnormal) and her pulse is 67. Her respiration is 20 and oxygen saturation is 100%.   Physical Exam General: NAD. Alert and oriented to person, place, time and situation  HEENT: Conjunctiva with mild pallor Cardiovascular: regular rate and rhythm Pulmonary: clear ant fields Abdomen: soft, nontender Extremities: no edema, no joint deformities Skin: no rashes, no bruising noted  Neurological: Weakness but otherwise nonfocal   Lab Results  Component Value Date   WBC 2.6 (L) 01/28/2024   HGB 7.6 (L) 01/28/2024   HCT 23.4 (L) 01/28/2024   MCV 113.6 (H) 01/28/2024   PLT 70 (L) 01/28/2024     Chemistry      Component Value Date/Time   NA 141 01/28/2024 1054   K 3.7 01/28/2024 1054   CL 105 01/28/2024 1054   CO2 27 01/28/2024 1054   BUN 24 (H) 01/28/2024 1054   CREATININE 1.44 (H) 01/28/2024 1054      Component Value Date/Time   CALCIUM  9.4 01/28/2024 1054   ALKPHOS 61 01/28/2024 1054   AST 12 (L) 01/28/2024 1054   ALT 16 01/28/2024 1054   BILITOT 0.6 01/28/2024 1054     Encounter Diagnoses  Name Primary?   Symptomatic anemia Yes   Macrocytic anemia    Thrombocytopenia (HCC)    Leukocytosis, unspecified type    Elevated serum immunoglobulin free light chains      Assessment and Plan- Patient is a 88 y.o. female who is followed by our office for thrombocytopenia, neutropenia, macrocytic anemia and  elevated free light chains. She again makes it very clear to myself and her husband that she does not wish to have anything done that could cause pain or harm. She states that she feels as if her body is naturally shutting down and that she is ok with that. She confirms her treatment and evaluation options but declines stating that she would still like to be monitored every month or so as her schedule allows. She would like to continue with current lab monitoring to maximize her time and in case she elects to have a blood transfusion in the future.   She appears to have fully autonomy and I  respect her wishes. I discussed with her and her husband that I wanted to make sure that they were well informed that if they did not elect to treat her lab abnormalities, they would likely continue to worsen, leading eventually to death.   Today her WBC is 2.8 with ANC of 1.3, Hgb is 7.4 and platelets are 53 Nutritional labs pending  I have reviewed her CBC and CMP with her today. Again we discussed that her low Hgb level could cause complications including death.  I have offered her a blood transfusion however she declines today.  B12/iron labs pending. At this time she declined IM injections or IV iron. She declines GI work up despite Music therapist.   I have offered and discussed hospice services which she declines at this time but will discuss further with family and her PCP.    Red flags reviewed which would indicate that she would need a sooner appointment.    I spent 30 minutes dedicated to the care of this patient (face to face and non-face to face) on the date of this encounter to include the discussions above.    Disposition: No blood tomorrow RTC 4 weeks APP, labs Day 1 RTC 1 unit of blood Day 2   Lauraine Dais PA-C 7/29/202511:02 AM

## 2024-03-11 ENCOUNTER — Telehealth: Payer: Self-pay | Admitting: *Deleted

## 2024-03-11 ENCOUNTER — Telehealth: Payer: Self-pay

## 2024-03-11 ENCOUNTER — Ambulatory Visit: Payer: Self-pay | Admitting: Medical Oncology

## 2024-03-11 NOTE — Telephone Encounter (Signed)
 For PCP when he returns.

## 2024-03-11 NOTE — Telephone Encounter (Signed)
 Copied from CRM [id number]. Topic: General - Other >> Mar 11, 2024 11:26 AM Henretta I wrote: Reason for CRM: Patient would like a call drom Dr.Paz whenever he is available to discuss appointment yesterday with oncology.

## 2024-03-11 NOTE — Telephone Encounter (Signed)
 I called patient and reviewed her labs. Per Lauraine Dais, PA, the B12 and iron levels are up. Reduce the B12 oral supplement to every other day. Also, placed a copy of her labs in the mail. She verbalized understanding.

## 2024-03-16 NOTE — Telephone Encounter (Signed)
 Advise patient.  Will discuss at her upcoming appointment

## 2024-03-16 NOTE — Telephone Encounter (Signed)
Tried calling Pt, no answer.  

## 2024-03-19 ENCOUNTER — Ambulatory Visit: Payer: PPO | Admitting: *Deleted

## 2024-03-19 ENCOUNTER — Telehealth: Payer: Self-pay | Admitting: *Deleted

## 2024-03-19 ENCOUNTER — Telehealth: Payer: Self-pay | Admitting: Internal Medicine

## 2024-03-19 VITALS — BP 140/39 | HR 71 | Temp 98.1°F | Resp 16 | Ht 66.0 in | Wt 182.8 lb

## 2024-03-19 DIAGNOSIS — Z0000 Encounter for general adult medical examination without abnormal findings: Secondary | ICD-10-CM | POA: Diagnosis not present

## 2024-03-19 DIAGNOSIS — Z23 Encounter for immunization: Secondary | ICD-10-CM

## 2024-03-19 NOTE — Patient Instructions (Signed)
 Ms. Suh , Thank you for taking time out of your busy schedule to complete your Annual Wellness Visit with me. I enjoyed our conversation and look forward to speaking with you again next year. I, as well as your care team,  appreciate your ongoing commitment to your health goals. Please review the following plan we discussed and let me know if I can assist you in the future. Your Game plan/ To Do List     Follow up Visits: Next Medicare AWV with our clinical staff:   03/23/25 11am  Next Office Visit with your provider: 03/31/24 9:20am  Clinician Recommendations:  Aim for 30 minutes of exercise or brisk walking, 6-8 glasses of water, and 5 servings of fruits and vegetables each day.       This is a list of the screening recommended for you and due dates:  Health Maintenance  Topic Date Due   Zoster (Shingles) Vaccine (1 of 2) Never done   COVID-19 Vaccine (5 - 2024-25 season) 04/14/2023   Medicare Annual Wellness Visit  03/18/2024   Flu Shot  11/10/2024*   DTaP/Tdap/Td vaccine (3 - Tdap) 07/12/2027   Pneumococcal Vaccine for age over 39  Completed   DEXA scan (bone density measurement)  Completed   Hepatitis B Vaccine  Aged Out   HPV Vaccine  Aged Out   Meningitis B Vaccine  Aged Out  *Topic was postponed. The date shown is not the original due date.    Advanced directives: (Copy Requested) Please bring a copy of your health care power of attorney and living will to the office to be added to your chart at your convenience. You can mail to [facility] [address] [place], [place] [address] or email to [email or web address] Advance Care Planning is important because it:  [x]  Makes sure you receive the medical care that is consistent with your values, goals, and preferences  [x]  It provides guidance to your family and loved ones and reduces their decisional burden about whether or not they are making the right decisions based on your wishes.  Follow the  link provided in your after visit summary or read over the paperwork we have mailed to you to help you started getting your Advance Directives in place. If you need assistance in completing these, please reach out to us  so that we can help you!  See attachments for Preventive Care and Fall Prevention Tips.

## 2024-03-19 NOTE — Telephone Encounter (Signed)
 Rescheduled pt for 03/23/24 at 4:20pm.

## 2024-03-19 NOTE — Telephone Encounter (Signed)
 Last hemoglobin is stable. Please reach out to the patient. Arrange office visit sooner than 03/31/2024 if patient so desires.  Overbook okay.  (Last appointment of the day so I have more time to talk to her)

## 2024-03-19 NOTE — Telephone Encounter (Signed)
 PT had AWV today.  She reported increased fatigue daily and mentioned that her iron has been low but is coming up (hgb 7.4). She doesn't want a transfusion.  Pt states she doesn't understand why her blood levels are low and would like clarification about cause.  Pt also reports being told she has 6 months to live by hematology and she does not understand why she was told this. Pt has OV with PCP on 03/31/24. I see previous documentation where pt called to discuss these concerns and was recommended to discuss at upcoming appt.  It doesn't appear that pt ever received that message.  I have left her message to call back and I will advise her to discuss concerns then.

## 2024-03-19 NOTE — Telephone Encounter (Signed)
 See last hematology note- Hgb and refusal for transfusion was discussed with Pt during this visit.

## 2024-03-19 NOTE — Progress Notes (Signed)
 Please attest this visit in the absence of patient primary care provider.    Subjective:   Doris Mcdaniel is a 88 y.o. who presents for a Medicare Wellness preventive visit.  As a reminder, Annual Wellness Visits don't include a physical exam, and some assessments may be limited, especially if this visit is performed virtually. We may recommend an in-person follow-up visit with your provider if needed.  Visit Complete: In person  Persons Participating in Visit: Patient.  AWV Questionnaire: No: Patient Medicare AWV questionnaire was not completed prior to this visit.  Cardiac Risk Factors include: advanced age (>67men, >4 women);dyslipidemia;hypertension;Other (see comment), Risk factor comments: CKD, hx breast cancer     Objective:    Today's Vitals   03/19/24 1020  BP: (!) 140/39  Pulse: 71  Resp: 16  Temp: 98.1 F (36.7 C)  TempSrc: Oral  SpO2: 99%  Weight: 182 lb 12.8 oz (82.9 kg)  Height: 5' 6 (1.676 m)   Body mass index is 29.5 kg/m.     03/19/2024   11:06 AM 03/10/2024   10:51 AM 01/28/2024   11:27 AM 01/07/2024   11:28 AM 12/24/2023   11:03 AM 12/10/2023   12:57 PM 03/19/2023   10:02 AM  Advanced Directives  Does Patient Have a Medical Advance Directive? Yes Yes Yes Yes Yes Yes Yes  Type of Estate agent of [place];Living will Healthcare Power of [place];Living will Healthcare Power of [place];Living will   Healthcare Power of [place];Living will Healthcare Power of [place];Living will  Does patient want to make changes to medical advance directive? Yes (MAU/Ambulatory/Procedural Areas - Information given)  No - Patient declined    No - Patient declined  Copy of Healthcare Power of Attorney in Chart? Yes - validated most recent copy scanned in chart (See row information) Yes - validated most recent copy scanned in chart (See row information) Yes - validated most recent copy scanned in chart (See row information) Yes - validated most  recent copy scanned in chart (See row information) Yes - validated most recent copy scanned in chart (See row information) Yes - validated most recent copy scanned in chart (See row information) Yes - validated most recent copy scanned in chart (See row information)    Current Medications (verified) Outpatient Encounter Medications as of 03/19/2024  Medication Sig   acetaminophen  (TYLENOL ) 500 MG tablet Take 2 tablets (1,000 mg total) by mouth 2 (two) times daily as needed for mild pain or moderate pain.   alendronate  (FOSAMAX ) 70 MG tablet Take 1 tablet (70 mg total) by mouth every 7 (seven) days. Take with a full glass of water on an empty stomach.   atorvastatin  (LIPITOR) 20 MG tablet Take 1 tablet (20 mg total) by mouth at bedtime.   CALCIUM -VITAMIN D PO Take by mouth.   carboxymethylcellulose (REFRESH PLUS) 0.5 % SOLN Place 1 drop into both eyes daily as needed.   cyanocobalamin  1000 MCG tablet Take 1,000 mcg by mouth 2 (two) times daily.   ferrous sulfate 325 (65 FE) MG EC tablet Take 325 mg by mouth 3 (three) times daily with meals.   hydrochlorothiazide  (HYDRODIURIL ) 25 MG tablet Take 1 tablet (25 mg total) by mouth daily.   lisinopril  (ZESTRIL ) 20 MG tablet Take 1 tablet (20 mg total) by mouth daily.   loratadine  (CLARITIN ) 10 MG tablet Take 1 tablet (10 mg total) by mouth daily.   Multiple Vitamin (MULTIVITAMIN) capsule Take 1 capsule by mouth daily.   Multiple Vitamins-Minerals (  PRESERVISION AREDS PO) Take by mouth.   verapamil  (CALAN -SR) 240 MG CR tablet Take 1 tablet (240 mg total) by mouth at bedtime.   No facility-administered encounter medications on file as of 03/19/2024.    Allergies (verified) Patient has no known allergies.   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, age indertiminate    Allergic rhinitis    GERD (gastroesophageal reflux disease)    History of breast cancer 2012    left breast   Hyperlipidemia    Hypertension    Personal history of radiation therapy    Past Surgical History:  Procedure Laterality Date   ABDOMINAL HYSTERECTOMY     BREAST BIOPSY Left 01/24/2011   BREAST LUMPECTOMY Left 02/21/2011   BREAST SURGERY     lumpectomy (L)   CATARACT EXTRACTION     B   CHOLECYSTECTOMY  03/2008   OOPHORECTOMY     they left a part of the ovarly   Family History  Problem Relation Age of Onset   Heart disease Brother        CABG age ~ 55   Colon cancer Neg Hx    Breast cancer Neg Hx    Stroke Neg Hx    Hypertension Neg Hx    Diabetes Neg Hx    Social History   Socioeconomic History   Marital status: Married    Spouse name: Not on file   Number of children: 2   Years of education: Not on file   Highest education level: Not on file  Occupational History   Occupation: retired  Tobacco Use   Smoking status: Former    Current packs/day: 0.00    Types: Cigarettes    Quit date: 07/08/1973    Years since quitting: 50.7   Smokeless tobacco: Never   Tobacco comments:    used to smoke 1 PPD  Vaping Use   Vaping status: Never Used  Substance and Sexual Activity   Alcohol use: No   Drug use: No   Sexual activity: Not Currently  Other Topics Concern   Not on file  Social History Narrative   Lives w/ husband . 2 children live near by   Social Drivers of Health   Financial Resource Strain: Low Risk  (03/19/2024)   Overall Financial Resource Strain (CARDIA)    Difficulty of Paying Living Expenses: Not hard at all  Food Insecurity: No Food Insecurity (03/19/2024)   Hunger Vital Sign    Worried About Running Out of Food in the Last Year: Never true    Ran Out of Food in the Last Year: Never true  Transportation Needs: No Transportation Needs (03/19/2024)   PRAPARE - Administrator, Civil Service (Medical): No    Lack of Transportation (Non-Medical): No  Physical Activity: Insufficiently Active (03/19/2024)   Exercise Vital Sign     Days of Exercise per Week: 1 day    Minutes of Exercise per Session: 10 min  Stress: No Stress Concern Present (03/19/2024)   [employer] of Occupational Health - Occupational Stress Questionnaire    Feeling of Stress: Not at all  Social Connections: Moderately Isolated (03/19/2024)   Social Connection and Isolation Panel    Frequency of Communication with Friends and Family: Three times a week    Frequency of Social Gatherings with Friends and Family: Never    Attends Religious Services: Never  Active Member of Clubs or Organizations: No    Attends Banker Meetings: Never    Marital Status: Married    Tobacco Counseling Counseling given: Not Answered Tobacco comments: used to smoke 1 PPD    Clinical Intake:  Pre-visit preparation completed: Yes  Pain : No/denies pain     BMI - recorded: 29.5 Nutritional Status: BMI 25 -29 Overweight Nutritional Risks: None Diabetes: No  Lab Results  Component Value Date   HGBA1C 5.7 07/26/2023   HGBA1C 5.7 07/20/2021   HGBA1C 5.6 07/14/2018     How often do you need to have someone help you when you read instructions, pamphlets, or other written materials from your doctor or pharmacy?: 1 - Never  Interpreter Needed?: No  Information entered by :: Lolita Libra, CMA   Activities of Daily Living     03/19/2024   10:44 AM  In your present state of health, do you have any difficulty performing the following activities:  Hearing? 0  Vision? 0  Difficulty concentrating or making decisions? 0  Walking or climbing stairs? 0  Dressing or bathing? 0  Doing errands, shopping? 0  Preparing Food and eating ? N  Using the Toilet? N  In the past six months, have you accidently leaked urine? N  Do you have problems with loss of bowel control? N  Managing your Medications? N  Managing your Finances? N  Housekeeping or managing your Housekeeping? N    Patient Care Team: Amon Aloysius BRAVO, MD as PCP - General Leslee Reusing, MD as Consulting Physician (Ophthalmology) I have updated your Care Teams any recent Medical Services you may have received from other providers in the past year.     Assessment:   This is a routine wellness examination for [place].  Hearing/Vision screen Hearing Screening - Comments:: Denies hearing difficulties.  Vision Screening - Comments:: Last eye exam 07/2023  with  Dr McCuen.    Goals Addressed   None    Depression Screen     03/19/2024   10:40 AM 03/10/2024   10:51 AM 12/10/2023    1:06 PM 12/02/2023   10:32 AM 12/02/2023   10:31 AM 07/26/2023   10:34 AM 03/19/2023   10:20 AM  PHQ 2/9 Scores  PHQ - 2 Score 1 0 0 0 0 0 0  PHQ- 9 Score 4   0  1     Fall Risk     03/19/2024   10:33 AM 12/02/2023   10:30 AM 07/26/2023    9:53 AM 03/19/2023   10:20 AM 07/23/2022   10:03 AM  Fall Risk   Falls in the past year? 0 0 0 0 0  Number falls in past yr: 0 0 0 0 0  Injury with Fall? 0 0 0 0 0  Risk for fall due to : No Fall Risks   No Fall Risks   Follow up Education provided Falls evaluation completed;Education provided Falls evaluation completed;Education provided Falls evaluation completed Falls evaluation completed      Data saved with a previous flowsheet row definition    MEDICARE RISK AT HOME:  Medicare Risk at Home Any stairs in or around the home?: Yes If so, are there any without handrails?: No Home free of loose throw rugs in walkways, pet beds, electrical cords, etc?: Yes Adequate lighting in your home to reduce risk of falls?: Yes Life alert?: No Use of a cane, walker or w/c?: No Grab bars in the bathroom?: Yes Shower  chair or bench in shower?: Yes Elevated toilet seat or a handicapped toilet?: Yes  TIMED UP AND GO:  Was the test performed?  Yes  Length of time to ambulate 10 feet: 8 sec Gait slow and steady without use of assistive device  Cognitive Function: 6CIT completed    02/17/2018   11:16 AM 01/17/2017   10:57 AM  MMSE - Mini Mental State  Exam  Orientation to time 5 5   Orientation to Place 5 5   Registration 3 3   Attention/ Calculation 5 5   Recall 2 2   Language- name 2 objects 2 2   Language- repeat 1 1  Language- follow 3 step command 3 3   Language- read & follow direction 1 1   Write a sentence 1 1   Copy design 1 0   Total score 29 28      Data saved with a previous flowsheet row definition        03/19/2024   10:44 AM 03/19/2023   10:23 AM 03/14/2022   10:25 AM  6CIT Screen  What Year? 0 points 0 points 0 points  What month? 0 points 0 points 0 points  What time? 0 points 0 points 0 points  Count back from 20 0 points 0 points 0 points  Months in reverse 0 points 0 points 0 points  Repeat phrase 6 points 6 points 0 points  Total Score 6 points 6 points 0 points    Immunizations Immunization History  Administered Date(s) Administered   Fluad Quad(high Dose 65+) 07/16/2019, 07/18/2020, 07/20/2021, 07/23/2022   Fluad Trivalent(High Dose 65+) 07/26/2023   Influenza Split 07/09/2011, 06/24/2012   Influenza Whole 05/05/2008, 05/06/2009, 07/12/2010   Influenza, High Dose Seasonal PF 06/30/2013, 07/05/2015, 07/09/2016, 07/11/2017, 07/14/2018   Influenza,inj,Quad PF,6+ Mos 07/02/2014   PFIZER(Purple Top)SARS-COV-2 Vaccination 09/17/2019, 10/07/2019, 06/25/2020   PNEUMOCOCCAL CONJUGATE-20 07/23/2022   Pfizer Covid-19 Vaccine Bivalent Booster 71yrs & up 07/20/2021   Pneumococcal Conjugate-13 07/02/2014   Pneumococcal Polysaccharide-23 07/13/2006, 07/14/2018   Td 05/21/2007, 07/11/2017    Screening Tests Health Maintenance  Topic Date Due   Medicare Annual Wellness (AWV)  03/18/2024   INFLUENZA VACCINE  11/10/2024 (Originally 03/13/2024)   COVID-19 Vaccine (5 - 2024-25 season) 03/19/2025 (Originally 04/14/2023)   Zoster Vaccines- Shingrix (1 of 2) 03/19/2025 (Originally 08/06/1955)   DTaP/Tdap/Td (3 - Tdap) 07/12/2027   Pneumococcal Vaccine: 50+ Years  Completed   DEXA SCAN  Completed   Hepatitis B  Vaccines  Aged Out   HPV VACCINES  Aged Out   Meningococcal B Vaccine  Aged Out    Health Maintenance  Health Maintenance Due  Topic Date Due   Medicare Annual Wellness (AWV)  03/18/2024   Health Maintenance Items Addressed: Pt declines shingles vaccine, all other HM up to date.  Additional Screening:  Vision Screening: Recommended annual ophthalmology exams for early detection of glaucoma and other disorders of the eye. Would you like a referral to an eye doctor? No    Dental Screening: Recommended annual dental exams for proper oral hygiene  Community Resource Referral / Chronic Care Management: CRR required this visit?  No   CCM required this visit?  No   Plan:    I have personally reviewed and noted the following in the patient's chart:   Medical and social history Use of alcohol, tobacco or illicit drugs  Current medications and supplements including opioid prescriptions. Patient is not currently taking opioid prescriptions. Functional ability and status Nutritional  status Physical activity Advanced directives List of other physicians Hospitalizations, surgeries, and ER visits in previous 12 months Vitals Screenings to include cognitive, depression, and falls Referrals and appointments  In addition, I have reviewed and discussed with patient certain preventive protocols, quality metrics, and best practice recommendations. A written personalized care plan for preventive services as well as general preventive health recommendations were provided to patient.   Lolita Libra, CMA   03/19/2024   After Visit Summary: (In Person-Printed) AVS printed and given to the patient  Notes: See phone note

## 2024-03-19 NOTE — Telephone Encounter (Signed)
 See previous phone note.

## 2024-03-19 NOTE — Telephone Encounter (Signed)
 Copied from CRM [id number]. Topic: General - Other >> Mar 19, 2024 12:33 PM Gennette ORN wrote: Reason for CRM: Patient is returning call back from [place] patient stated you can call home or mobile. But she is home now.

## 2024-03-23 ENCOUNTER — Ambulatory Visit (INDEPENDENT_AMBULATORY_CARE_PROVIDER_SITE_OTHER): Admitting: Internal Medicine

## 2024-03-23 VITALS — BP 133/68 | HR 77 | Temp 98.8°F | Resp 16 | Ht 66.0 in | Wt 182.0 lb

## 2024-03-23 DIAGNOSIS — D649 Anemia, unspecified: Secondary | ICD-10-CM

## 2024-03-23 DIAGNOSIS — E538 Deficiency of other specified B group vitamins: Secondary | ICD-10-CM

## 2024-03-23 NOTE — Patient Instructions (Signed)
 Arrange for  a lab appointment for tomorrow  If you get worse, you need to go to the ER, it might be from the anemia getting worse

## 2024-03-23 NOTE — Progress Notes (Signed)
 Subjective:    Patient ID: Niels CHRISTELLA Slade, female    DOB: February 28, 1936, 88 y.o.   MRN: [id number]  DOS:  03/23/2024 Type of visit - description: Acute, here with her husband,  Since the last visit, was evaluated by hematology oncology due to anemia and thrombocytopenia. The issue was discussed extensively by the hematology team with the patient however she still has some concerns, question.   At this point she again reports no energy, + DOE. Denies chest pain No nausea or vomiting.  No diarrhea or blood in the stools. No palpitations. No fever chills or weight loss.  Wt Readings from Last 3 Encounters:  03/23/24 182 lb (82.6 kg)  03/19/24 182 lb 12.8 oz (82.9 kg)  03/10/24 179 lb (81.2 kg)    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, age indertiminate    Allergic rhinitis    GERD (gastroesophageal reflux disease)    History of breast cancer 2012   left breast   Hyperlipidemia    Hypertension    Personal history of radiation therapy     Past Surgical History:  Procedure Laterality Date   ABDOMINAL HYSTERECTOMY     BREAST BIOPSY Left 01/24/2011   BREAST LUMPECTOMY Left 02/21/2011   BREAST SURGERY     lumpectomy (L)   CATARACT EXTRACTION     B   CHOLECYSTECTOMY  03/2008   OOPHORECTOMY     they left a part of the ovarly    Current Outpatient Medications  Medication Instructions   acetaminophen  (TYLENOL ) 1,000 mg, Oral, 2 times daily PRN   alendronate  (FOSAMAX ) 70 mg, Oral, Every 7 days, Take with a full glass of water on an empty stomach.   atorvastatin  (LIPITOR) 20 mg, Oral, Daily at bedtime   CALCIUM -VITAMIN D PO Take by mouth.   carboxymethylcellulose (REFRESH PLUS) 0.5 % SOLN 1 drop, Daily PRN   cyanocobalamin  1,000 mcg, 2 times daily   ferrous sulfate 325 mg, 3 times daily with meals   hydrochlorothiazide  (HYDRODIURIL ) 25 mg, Oral, Daily    lisinopril  (ZESTRIL ) 20 mg, Oral, Daily   loratadine  (CLARITIN ) 10 mg, Oral, Daily   Multiple Vitamin (MULTIVITAMIN) capsule 1 capsule, Daily   Multiple Vitamins-Minerals (PRESERVISION AREDS PO) Take by mouth.   verapamil  (CALAN -SR) 240 mg, Oral, Daily at bedtime       Objective:   Physical Exam BP 133/68 (BP Location: Left Arm, Patient Position: Sitting, Cuff Size: Normal)   Pulse 77   Temp 98.8 F (37.1 C) (Oral)   Resp 16   Ht 5' 6 (1.676 m)   Wt 182 lb (82.6 kg)   SpO2 100%   BMI 29.38 kg/m  General:   Well developed, NAD, BMI noted. HEENT:  Normocephalic . Face symmetric, atraumatic   Skin: Not pale. Not jaundice Neurologic:  alert & oriented X3.  Speech normal, gait appropriate for age and unassisted Psych--  Cognition and judgment appear intact.  Cooperative with normal attention span and concentration.  Behavior appropriate. No anxious or depressed appearing.      Assessment   Assessment  HTN Hyperlipidemia CKD, GFR 30 (2023) GERD DJD  Breast cancer, 2012, left, lumpectomy, XRT. Does not see oncology regularly  MSK: --T8  mild compression per x-ray  --DEXA 2014 and  2018: Normal. Vitamin D normal when checked -- T score 08/2023 - 1.0: , started fosamax   08/2023  PLAN  Symptomatic macrocytic anemia, thrombocytopenia, neutropenia, B12 deficiency, elevated free light chains: Workup included : myelodysplastic panel WNL.  Low B12.  Ferritin normal.  Multiple myeloma panel: No monoclonal protein is apparent Has been offered transfusions, further blood work, B12 injections, infusions. So far she has declined above, except for B12 supplements by mouth.  Explained patient that her anemia is severe, it is causing DOE, it has the potential to getting worse causing even more serious issue such as chest pain and MI. I communicated with hematology, B12 is better, continue oral supplements.  They do recommend BM biopsy to further evaluate the problem. I spoke with the  patient, explained all of the above and she is now more willing to proceed with a biopsy to further clarify the diagnosis and possibly offer her treatment.   Time spent with the patient 35 minutes.  Reviewing the chart, oncology notes & results.  Explaining the patient the meaning of such. Also explaining the risk of severe anemia and discussing the case with hematology

## 2024-03-24 ENCOUNTER — Other Ambulatory Visit

## 2024-03-24 NOTE — Assessment & Plan Note (Signed)
 Symptomatic macrocytic anemia, thrombocytopenia, neutropenia, B12 deficiency, elevated free light chains: Workup included : myelodysplastic panel WNL.  Low B12.  Ferritin normal.  Multiple myeloma panel: No monoclonal protein is apparent Has been offered transfusions, further blood work, B12 injections, infusions. So far she has declined above, except for B12 supplements by mouth.  Explained patient that her anemia is severe, it is causing DOE, it has the potential to getting worse causing even more serious issue such as chest pain and MI. I communicated with hematology, B12 is better, continue oral supplements.  They do recommend BM biopsy to further evaluate the problem. I spoke with the patient, explained all of the above and she is now more willing to proceed with a biopsy to further clarify the diagnosis and possibly offer her treatment.

## 2024-03-31 ENCOUNTER — Ambulatory Visit: Admitting: Internal Medicine

## 2024-04-07 DIAGNOSIS — H353211 Exudative age-related macular degeneration, right eye, with active choroidal neovascularization: Secondary | ICD-10-CM | POA: Diagnosis not present

## 2024-04-09 ENCOUNTER — Ambulatory Visit: Admitting: Medical Oncology

## 2024-04-09 ENCOUNTER — Inpatient Hospital Stay

## 2024-04-10 ENCOUNTER — Encounter

## 2024-04-25 ENCOUNTER — Emergency Department (HOSPITAL_COMMUNITY)
Admission: EM | Admit: 2024-04-25 | Discharge: 2024-04-25 | Disposition: A | Discharge status: Home / Self Care | Attending: Emergency Medicine | Admitting: Emergency Medicine

## 2024-04-25 ENCOUNTER — Encounter (HOSPITAL_COMMUNITY): Payer: Self-pay

## 2024-04-25 ENCOUNTER — Emergency Department (HOSPITAL_COMMUNITY)

## 2024-04-25 DIAGNOSIS — D72819 Decreased white blood cell count, unspecified: Secondary | ICD-10-CM | POA: Insufficient documentation

## 2024-04-25 DIAGNOSIS — Z79899 Other long term (current) drug therapy: Secondary | ICD-10-CM | POA: Diagnosis not present

## 2024-04-25 DIAGNOSIS — D519 Vitamin B12 deficiency anemia, unspecified: Secondary | ICD-10-CM | POA: Insufficient documentation

## 2024-04-25 DIAGNOSIS — I129 Hypertensive chronic kidney disease with stage 1 through stage 4 chronic kidney disease, or unspecified chronic kidney disease: Secondary | ICD-10-CM | POA: Insufficient documentation

## 2024-04-25 DIAGNOSIS — I517 Cardiomegaly: Secondary | ICD-10-CM | POA: Diagnosis not present

## 2024-04-25 DIAGNOSIS — U071 COVID-19: Secondary | ICD-10-CM | POA: Diagnosis not present

## 2024-04-25 DIAGNOSIS — D649 Anemia, unspecified: Secondary | ICD-10-CM | POA: Diagnosis not present

## 2024-04-25 DIAGNOSIS — N189 Chronic kidney disease, unspecified: Secondary | ICD-10-CM | POA: Diagnosis not present

## 2024-04-25 DIAGNOSIS — D696 Thrombocytopenia, unspecified: Secondary | ICD-10-CM | POA: Diagnosis not present

## 2024-04-25 DIAGNOSIS — J029 Acute pharyngitis, unspecified: Secondary | ICD-10-CM | POA: Diagnosis not present

## 2024-04-25 DIAGNOSIS — R0789 Other chest pain: Secondary | ICD-10-CM | POA: Diagnosis not present

## 2024-04-25 DIAGNOSIS — R079 Chest pain, unspecified: Secondary | ICD-10-CM | POA: Diagnosis not present

## 2024-04-25 LAB — BASIC METABOLIC PANEL WITH GFR
Anion gap: 14 (ref 5–15)
BUN: 24 mg/dL — ABNORMAL HIGH (ref 8–23)
CO2: 21 mmol/L — ABNORMAL LOW (ref 22–32)
Calcium: 8.2 mg/dL — ABNORMAL LOW (ref 8.9–10.3)
Chloride: 103 mmol/L (ref 98–111)
Creatinine, Ser: 1.75 mg/dL — ABNORMAL HIGH (ref 0.44–1.00)
GFR, Estimated: 28 mL/min — ABNORMAL LOW (ref >60)
Glucose, Bld: 126 mg/dL — ABNORMAL HIGH (ref 70–99)
Potassium: 3.5 mmol/L (ref 3.5–5.1)
Sodium: 138 mmol/L (ref 135–145)

## 2024-04-25 LAB — CBC
HCT: 18.7 % — ABNORMAL LOW (ref 36.0–46.0)
Hemoglobin: 6 g/dL — CL (ref 12.0–15.0)
MCH: 39.5 pg — ABNORMAL HIGH (ref 26.0–34.0)
MCHC: 32.1 g/dL (ref 30.0–36.0)
MCV: 123 fL — ABNORMAL HIGH (ref 80.0–100.0)
Platelets: 35 K/uL — ABNORMAL LOW (ref 150–400)
RBC: 1.52 MIL/uL — ABNORMAL LOW (ref 3.87–5.11)
RDW: 15.7 % — ABNORMAL HIGH (ref 11.5–15.5)
WBC: 1.7 K/uL — ABNORMAL LOW (ref 4.0–10.5)
nRBC: 0 % (ref 0.0–0.2)

## 2024-04-25 LAB — RESP PANEL BY RT-PCR (RSV, FLU A&B, COVID)  RVPGX2
Influenza A by PCR: NEGATIVE
Influenza B by PCR: NEGATIVE
Resp Syncytial Virus by PCR: NEGATIVE
SARS Coronavirus 2 by RT PCR: POSITIVE — AB

## 2024-04-25 LAB — PREPARE RBC (CROSSMATCH)

## 2024-04-25 LAB — TROPONIN I (HIGH SENSITIVITY): Troponin I (High Sensitivity): 9 ng/L (ref <18)

## 2024-04-25 LAB — ABO/RH: ABO/RH(D): O NEG

## 2024-04-25 MED ORDER — LIDOCAINE VISCOUS HCL 2 % MT SOLN: 15.0000 mL | OROMUCOSAL | 0 refills | Status: DC | PRN

## 2024-04-25 MED ORDER — SODIUM CHLORIDE 0.9% IV SOLUTION: Freq: Once | INTRAVENOUS | Status: DC

## 2024-04-25 MED ORDER — LIDOCAINE VISCOUS HCL 2 % MT SOLN
15.0000 mL | Freq: Once | OROMUCOSAL | Status: AC
  Administered 2024-04-25: 15 mL via OROMUCOSAL
  Filled 2024-04-25: qty 15

## 2024-04-25 NOTE — ED Notes (Signed)
 CRITICAL VALUE STICKER  CRITICAL VALUE:Hgb 6.0  RECEIVER (on-site recipient of call):I. Dory Verdun  DATE & TIME NOTIFIED: [phone] 04/25/24  MESSENGER (representative from lab):Lab  MD NOTIFIED: [place]  TIME OF NOTIFICATION:0814  RESPONSE:

## 2024-04-25 NOTE — ED Triage Notes (Signed)
 Pt states that she has been having a sore throat since last night and then CP that has been on and off for the past 2-3 days

## 2024-04-25 NOTE — Discharge Instructions (Addendum)
 Your sore throat is secondary to COVID.  I have prescribed some medication to help with the sore throat, but the rest of the symptoms from the COVID are self-limited, will improve over time as the virus resolves on its own.  Please return if you have significant worsening chest pain, shortness of breath

## 2024-04-25 NOTE — ED Notes (Signed)
 Patient was able to swallow medication without any problems.

## 2024-04-25 NOTE — ED Triage Notes (Signed)
 Pt bib pov c/o sore throat and ear ache. Pt initially had drainage then it progressed. Pt says she has a hard time swallowing water or ice chips d/t pain.

## 2024-04-25 NOTE — ED Provider Notes (Signed)
 [place] EMERGENCY DEPARTMENT AT [facility] Provider Note   CSN: [id number] Arrival date & time: 04/25/24  9362     Patient presents with: Sore Throat, Otalgia, and Chest Pain   Doris Mcdaniel is a 88 y.o. female with past medical history significant for HTN, HLD, GERD, CKD, who presents with concern for sore throat last night. Intermittent CP for 2-3 days. Not present this morning, but typically worse with exertion. Some ear pain bilaterally, no drainage. Reports difficulty swallowing due to pain.     Sore Throat Associated symptoms include chest pain.  Otalgia Chest Pain      Prior to Admission medications   Medication Sig Start Date End Date Taking? Authorizing Provider  lidocaine  (XYLOCAINE ) 2 % solution Use as directed 15 mLs in the mouth or throat every 4 (four) hours as needed for mouth pain. 04/25/24  Yes Daksh Coates H, PA-C  acetaminophen  (TYLENOL ) 500 MG tablet Take 2 tablets (1,000 mg total) by mouth 2 (two) times daily as needed for mild pain or moderate pain. 10/10/21   Eckard, Tammy, RPH-CPP  alendronate  (FOSAMAX ) 70 MG tablet Take 1 tablet (70 mg total) by mouth every 7 (seven) days. Take with a full glass of water on an empty stomach. 08/27/23   Amon Aloysius BRAVO, MD  atorvastatin  (LIPITOR) 20 MG tablet Take 1 tablet (20 mg total) by mouth at bedtime. 01/24/24   Amon Aloysius BRAVO, MD  CALCIUM -VITAMIN D PO Take by mouth.    [provider]  carboxymethylcellulose (REFRESH PLUS) 0.5 % SOLN Place 1 drop into both eyes daily as needed.    [provider]  cyanocobalamin  1000 MCG tablet Take 1,000 mcg by mouth 2 (two) times daily.    [provider]  ferrous sulfate 325 (65 FE) MG EC tablet Take 325 mg by mouth 3 (three) times daily with meals.    [provider]  hydrochlorothiazide  (HYDRODIURIL ) 25 MG tablet Take 1 tablet (25 mg total) by mouth daily. 01/24/24   Amon Aloysius BRAVO, MD  lisinopril  (ZESTRIL ) 20 MG tablet Take 1 tablet  (20 mg total) by mouth daily. 01/24/24   Amon Aloysius BRAVO, MD  loratadine  (CLARITIN ) 10 MG tablet Take 1 tablet (10 mg total) by mouth daily. 12/28/13   Paz, Jose E, MD  Multiple Vitamin (MULTIVITAMIN) capsule Take 1 capsule by mouth daily.    [provider]  Multiple Vitamins-Minerals (PRESERVISION AREDS PO) Take by mouth.    [provider]  verapamil  (CALAN -SR) 240 MG CR tablet Take 1 tablet (240 mg total) by mouth at bedtime. 01/24/24   Paz, Jose E, MD    Allergies: Patient has no known allergies.    Review of Systems  HENT:  Positive for ear pain.   Cardiovascular:  Positive for chest pain.  All other systems reviewed and are negative.   Updated Vital Signs BP (!) 134/52   Pulse 60   Temp 98.3 F (36.8 C) (Oral)   Resp 20   SpO2 100%   Physical Exam Vitals and nursing note reviewed.  Constitutional:      General: She is not in acute distress.    Appearance: Normal appearance.  HENT:     Head: Normocephalic and atraumatic.     Mouth/Throat:     Comments: Posterior oropharynx is clear, very mild redness, no tonsillar exudate or swelling. No anterior cervical neck swelling or tenderness. Eyes:     General:        Right eye:  No discharge.        Left eye: No discharge.  Cardiovascular:     Rate and Rhythm: Normal rate and regular rhythm.  Pulmonary:     Effort: Pulmonary effort is normal. No respiratory distress.  Musculoskeletal:        General: No deformity.  Skin:    General: Skin is warm and dry.  Neurological:     Mental Status: She is alert and oriented to person, place, and time.  Psychiatric:        Mood and Affect: Mood normal.        Behavior: Behavior normal.     (all labs ordered are listed, but only abnormal results are displayed) Labs Reviewed  RESP PANEL BY RT-PCR (RSV, FLU A&B, COVID)  RVPGX2 - Abnormal; Notable for the following components:      Result Value   SARS Coronavirus 2 by RT PCR POSITIVE (*)    All other components within  normal limits  BASIC METABOLIC PANEL WITH GFR - Abnormal; Notable for the following components:   CO2 21 (*)    Glucose, Bld 126 (*)    BUN 24 (*)    Creatinine, Ser 1.75 (*)    Calcium  8.2 (*)    GFR, Estimated 28 (*)    All other components within normal limits  CBC - Abnormal; Notable for the following components:   WBC 1.7 (*)    RBC 1.52 (*)    Hemoglobin 6.0 (*)    HCT 18.7 (*)    MCV 123.0 (*)    MCH 39.5 (*)    RDW 15.7 (*)    Platelets 35 (*)    All other components within normal limits  TYPE AND SCREEN  PREPARE RBC (CROSSMATCH)  ABO/RH  TROPONIN I (HIGH SENSITIVITY)    EKG: EKG Interpretation Date/Time:  Saturday April 25 2024 06:50:59 EDT Ventricular Rate:  71 PR Interval:  172 QRS Duration:  84 QT Interval:  390 QTC Calculation: 423 R Axis:   -20  Text Interpretation: Normal sinus rhythm Normal ECG When compared with ECG of 20-Feb-2011 09:53, No significant change since last tracing Confirmed by Towana Sharper [phone]) on 04/25/2024 8:09:17 AM  Radiology: [facility] Chest 2 View Result Date: 04/25/2024 CLINICAL DATA:  88 year old female with chest pain, sore throat. EXAM: CHEST - 2 VIEW COMPARISON:  Chest radiograph 02/20/2011 and earlier. FINDINGS: PA and lateral views 0716 hours. Mild cardiomegaly, not significantly changed. Other mediastinal contours are within normal limits. Visualized tracheal air column is within normal limits. Linear atelectasis or scarring at the left lung base is chronic, projected more over the diaphragm on the 2012 comparison. Lung volumes remain within normal limits. No pneumothorax, pulmonary edema, pleural effusion or acute lung opacity. Osteopenia, spine scoliosis and degeneration. Stable cholecystectomy clips. Negative visible bowel gas. IMPRESSION: No acute cardiopulmonary abnormality. Electronically Signed   By: VEAR Hurst M.D.   On: 04/25/2024 07:36     Procedures   Medications Ordered in the ED  0.9 %  sodium chloride  infusion  (Manually program via Guardrails IV Fluids) (has no administration in time range)  lidocaine  (XYLOCAINE ) 2 % viscous mouth solution 15 mL (15 mLs Mouth/Throat Given 04/25/24 0904)                                    Medical Decision Making Amount and/or Complexity of Data Reviewed Labs: ordered. Radiology: ordered.  Risk Prescription  drug management.   This patient is a 88 y.o. female  who presents to the ED for concern of sore throat, intermittent chest pain on exertion not present at rest or at all today.   Differential diagnoses prior to evaluation: The emergent differential diagnosis includes, but is not limited to,  ACS, AAS, PE, Mallory-Weiss, Boerhaave's, Pneumonia, acute bronchitis, asthma or COPD exacerbation, anxiety, MSK pain or traumatic injury to the chest, acid reflux versus other, retropharyngeal abscess, peritonsillar abscess, Ludwig angina, URI, bronchitis, tonsillitis, versus other. This is not an exhaustive differential.   Past Medical History / Co-morbidities / Social History: Anemia, B12 deficiency, hypertension, hyperlipidemia, CKD, GERD  Additional history: Chart reviewed. Pertinent results include: Extensively reviewed lab work, imaging from outpatient PCP visits, oncology visits  Physical Exam: Physical exam performed. The pertinent findings include: Mild diastolic hypotension, blood pressure 127/44, but MAP is greater than 65.  Mild tachypnea, respirations 22.  Posterior oropharynx with mild redness, without swelling or exudate of tonsils.  No stridor, no dysphonia.  Lab Tests/Imaging studies: I personally interpreted labs/imaging and the pertinent results include: CBC with leukocytopenia, white blood cells 1.7, anemia, hemoglobin 6.0, thrombocytopenia, platelets 35.  All worsened from recent baseline.  Patient has been worked up recently for myelodysplastic syndrome or other causes of aplastic anemia with out any noted acute cause other than B12 deficiency.   BMP with elevated BUN, creatinine, 24, 1.75, but not significantly changed from her baseline CKD.  Her RVP is positive for COVID, viral illness may be contributing to her leukocytopenia somewhat.  Troponin negative x 1 in context of no chest pain at all today, intermittent chest pain over the last few days. I agree with the radiologist interpretation.  Cardiac monitoring: EKG obtained and interpreted by myself and attending physician which shows: Normal sinus rhythm no significant change from last tracing.   Medications: I ordered medication including viscous lidocaine  for sore throat, packed red blood cell transfusion secondary to acute on chronic anemia.  I have reviewed the patients home medicines and have made adjustments as needed.   Disposition: After consideration of the diagnostic results and the patients response to treatment, I feel that patient stable for discharge with plan as above, we offered hospital admission but patient declined.   emergency department workup does not suggest an emergent condition requiring admission or immediate intervention beyond what has been performed at this time. The plan is: as above. The patient is safe for discharge and has been instructed to return immediately for worsening symptoms, change in symptoms or any other concerns.   Final diagnoses:  Anemia due to vitamin B12 deficiency, unspecified B12 deficiency type  COVID-19    ED Discharge Orders          Ordered    lidocaine  (XYLOCAINE ) 2 % solution  Every 4 hours PRN        04/25/24 1118               Rossie Bretado, Hubbard H, PA-C 04/25/24 1359    Towana Ozell BROCKS, MD 04/25/24 1700

## 2024-04-26 LAB — TYPE AND SCREEN
ABO/RH(D): O NEG
Antibody Screen: NEGATIVE
Unit division: 0

## 2024-04-26 LAB — BPAM RBC
Blood Product Expiration Date: 202509202359
Blood Product Expiration Date: 202510022359
ISSUE DATE / TIME: 202509131008
ISSUE DATE / TIME: 202509131248
Unit Type and Rh: 9500
Unit Type and Rh: 9500

## 2024-04-27 ENCOUNTER — Ambulatory Visit: Payer: Self-pay

## 2024-04-27 ENCOUNTER — Inpatient Hospital Stay

## 2024-04-27 ENCOUNTER — Other Ambulatory Visit: Payer: Self-pay | Admitting: *Deleted

## 2024-04-27 ENCOUNTER — Inpatient Hospital Stay: Admitting: Medical Oncology

## 2024-04-27 DIAGNOSIS — D539 Nutritional anemia, unspecified: Secondary | ICD-10-CM

## 2024-04-27 DIAGNOSIS — D649 Anemia, unspecified: Secondary | ICD-10-CM

## 2024-04-27 NOTE — Telephone Encounter (Signed)
 FYI Only or Action Required?: Action required by provider: request for appointment.  Patient was last seen in primary care on 03/23/2024 by Doris Aloysius BRAVO, MD.  Called Nurse Triage reporting Dysphagia and Covid Positive.  Symptoms began several days ago.  Interventions attempted: Prescription medications: lidocaine  gel.  Symptoms are: gradually improving.  Triage Disposition: Call PCP Within 24 Hours  Patient/caregiver understands and will follow disposition?: Yes                             Copied from CRM #[id number]. Topic: Clinical - Red Word Triage >> Apr 27, 2024 10:24 AM [name] wrote: Red Word that prompted transfer to Nurse Triage: Patient is calling in with trouble swallowing since Friday evening, patient went to the hospital on Saturday and they told her she had Covid but she is still having trouble swallowing and can hardly eat.    ----------------------------------------------------------------------- From previous Reason for Contact - Scheduling: Patient/patient representative is calling to schedule an appointment. Refer to attachments for appointment information. Reason for Disposition  [1] HIGH RISK patient (e.g., weak immune system, age > 64 years, obesity with BMI 30 or higher, pregnant, chronic lung disease or other chronic medical condition) AND [2] COVID symptoms (e.g., cough, fever)  (Exceptions: Already seen by PCP and no new or worsening symptoms.)  Answer Assessment - Initial Assessment Questions 1. COVID-19 DIAGNOSIS: How do you know that you have COVID? (e.g., positive lab test or self-test, diagnosed by doctor or NP/PA, symptoms after exposure).     Yes, went to ED on Saturday and was diagnosed there 3. ONSET: When did the COVID-19 symptoms start?      Friday  4. WORST SYMPTOM: What is your worst symptom? (e.g., cough, fever, shortness of breath, muscle aches)     Difficulty swallowing- states she is able to swallow liquids,  states the sensation may be due to productive cough and inability to get phlegm out  5. COUGH: Do you have a cough? If Yes, ask: How bad is the cough?       Yes 6. FEVER: Do you have a fever? If Yes, ask: What is your temperature, how was it measured, and when did it start?     Denies 7. RESPIRATORY STATUS: Describe your breathing? (e.g., normal; shortness of breath, wheezing, unable to speak)      Denies difficulty breathing, patient able to speak in clear and complete sentences 8. BETTER-SAME-WORSE: Are you getting better, staying the same or getting worse compared to yesterday?  If getting worse, ask, In what way?     Improving, but slowly  9. OTHER SYMPTOMS: Do you have any other symptoms?  (e.g., chills, fatigue, headache, loss of smell or taste, muscle pain, sore throat)     Sore throat, chest congestion, fatigue, denies headache, denies body aches, denies dizziness, denies chest pain 10. HIGH RISK DISEASE: Do you have any chronic medical problems? (e.g., asthma, heart or lung disease, weak immune system, obesity, etc.)     Denies 13. O2 SATURATION MONITOR:  Do you use an oxygen saturation monitor (pulse oximeter) at home? If Yes, ask What is your reading (oxygen level) today? What is your usual oxygen saturation reading? (e.g., 95%)     Denies    Patient stated she went to ED for difficulty swallowing/sore throat on 04/25/24. Patient was diagnosed with COVID and discharged. Patient stated symptoms are improving, but she is still experiencing difficulty swallowing phlegm. This  RN advised patient to come into the office for a HFU. Patient's PCP not available until 05/22/24 for HFU. This RN offered sooner HFU appointments with alternate providers, as early as tomorrow, and patient declined. Patient stated she would only like to see Dr. Amon. Please advise. Patient would like a call back.  Protocols used: Coronavirus (COVID-19) Diagnosed or Suspected-A-AH

## 2024-04-27 NOTE — Telephone Encounter (Signed)
 Spoke w/ Pt- appt scheduled for 05/01/24.

## 2024-05-01 ENCOUNTER — Ambulatory Visit (HOSPITAL_BASED_OUTPATIENT_CLINIC_OR_DEPARTMENT_OTHER)
Admission: RE | Admit: 2024-05-01 | Discharge: 2024-05-01 | Disposition: A | Discharge status: Home / Self Care | Source: Ambulatory Visit | Attending: Internal Medicine | Admitting: Internal Medicine

## 2024-05-01 ENCOUNTER — Encounter: Payer: Self-pay | Admitting: Internal Medicine

## 2024-05-01 ENCOUNTER — Ambulatory Visit (INDEPENDENT_AMBULATORY_CARE_PROVIDER_SITE_OTHER): Admitting: Internal Medicine

## 2024-05-01 VITALS — BP 138/78 | HR 65 | Temp 97.9°F | Resp 18 | Ht 66.0 in | Wt 177.1 lb

## 2024-05-01 DIAGNOSIS — D649 Anemia, unspecified: Secondary | ICD-10-CM | POA: Diagnosis not present

## 2024-05-01 DIAGNOSIS — M79672 Pain in left foot: Secondary | ICD-10-CM

## 2024-05-01 DIAGNOSIS — U071 COVID-19: Secondary | ICD-10-CM | POA: Diagnosis not present

## 2024-05-01 DIAGNOSIS — M19072 Primary osteoarthritis, left ankle and foot: Secondary | ICD-10-CM | POA: Diagnosis not present

## 2024-05-01 DIAGNOSIS — R0602 Shortness of breath: Secondary | ICD-10-CM | POA: Diagnosis not present

## 2024-05-01 NOTE — Progress Notes (Signed)
 Subjective:    Patient ID: Doris Mcdaniel, female    DOB: 12-10-1935, 88 y.o.   MRN: [id number]  DOS:  05/01/2024 Type of visit - description: ER follow-up  1 day prior to the ER visit she developed rather acutely shortness of breath, some difficulty swallowing. No fever or chills.  No cough.  No nausea or vomiting.  No diarrhea.  No sore throat.  At the emergency room, hemoglobin got much worse from previous values from 7.4 to  6.0. Received 2 PRBCs.  At this point she reports  is feeling great, shortness of breath essentially gone.  Also, 2-day history of pain at the left foot, no swelling, no red, no warmth.  He does not recall any injury.  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, age indertiminate    Allergic rhinitis    GERD (gastroesophageal reflux disease)    History of breast cancer 2012   left breast   Hyperlipidemia    Hypertension    Personal history of radiation therapy     Past Surgical History:  Procedure Laterality Date   ABDOMINAL HYSTERECTOMY     BREAST BIOPSY Left 01/24/2011   BREAST LUMPECTOMY Left 02/21/2011   BREAST SURGERY     lumpectomy (L)   CATARACT EXTRACTION     B   CHOLECYSTECTOMY  03/2008   OOPHORECTOMY     they left a part of the ovarly    Current Outpatient Medications  Medication Instructions   acetaminophen  (TYLENOL ) 1,000 mg, Oral, 2 times daily PRN   alendronate  (FOSAMAX ) 70 mg, Oral, Every 7 days, Take with a full glass of water on an empty stomach.   atorvastatin  (LIPITOR) 20 mg, Oral, Daily at bedtime   CALCIUM -VITAMIN D PO Take by mouth.   carboxymethylcellulose (REFRESH PLUS) 0.5 % SOLN 1 drop, Daily PRN   cyanocobalamin  1,000 mcg, 2 times daily   ferrous sulfate 325 mg, 3 times daily with meals   hydrochlorothiazide  (HYDRODIURIL ) 25 mg, Oral, Daily   lidocaine  (XYLOCAINE ) 2 % solution 15 mLs,  Mouth/Throat, Every 4 hours PRN   lisinopril  (ZESTRIL ) 20 mg, Oral, Daily   loratadine  (CLARITIN ) 10 mg, Oral, Daily   Multiple Vitamin (MULTIVITAMIN) capsule 1 capsule, Daily   Multiple Vitamins-Minerals (PRESERVISION AREDS PO) Take by mouth.   verapamil  (CALAN -SR) 240 mg, Oral, Daily at bedtime       Objective:   Physical Exam Musculoskeletal:       Feet:    BP 138/78   Pulse 65   Temp 97.9 F (36.6 C) (Oral)   Resp 18   Ht 5' 6 (1.676 m)   Wt 177 lb 2 oz (80.3 kg)   SpO2 98%   BMI 28.59 kg/m  General:   Well developed, NAD, BMI noted. HEENT:  Normocephalic . Face symmetric, atraumatic Lungs:  CTA B Normal respiratory effort, no intercostal retractions, no accessory muscle use. Heart: RRR,  no murmur.  Lower extremities: - No pretibial edema bilaterally - Normal pedal pulses.  See graphic. Skin: Not pale. Not jaundice Neurologic:  alert & oriented X3.  Speech normal, gait appropriate for age and unassisted Psych--  Cognition and judgment appear intact.  Cooperative with normal attention span and concentration.  Behavior appropriate. No anxious or depressed appearing.      Assessment     Assessment  HTN Hyperlipidemia CKD, GFR  30 (2023) GERD DJD  Breast cancer, 2012, left, lumpectomy, XRT. Does not see oncology regularly  MSK: --T8  mild compression per x-ray  --DEXA 2014 and  2018: Normal. Vitamin D normal when checked -- T score 08/2023 - 1.0: , started fosamax  08/2023  PLAN  Shortness of breath: Last seen by me for symptomatic anemia 03/23/2024. Went to the ER 04/25/2024 with acute SOB associated with some difficulty swallowing.  No fever chills or cough. Workup: COVID test positive. Creatinine 1.7 slightly higher than usual.  Calcium  slightly low at 8.2.  Hemoglobin decreased further from 7.4-6.0.  Chest x-ray nonacute.  Received a transfusion x 2 per pt. At this point states she feels very well, essentially no shortness of breath.  I believe the  SOB was related to anemia rather than COVID.  Nevertheless plan is to continue present care and keep the appointment with hematology in a couple of days.  Again I encouraged her to let them do the workup needed to provide her with the best possible treatment. Symptomatic macrocytic anemia, thrombocytopenia, neutropenia, B12 deficiency, elevated free light chains: See above. L foot pain: Started a couple of days ago, it is TTP at the lateral area with mild redness.  No obvious abscess, cuts or openings.  No known injury.  Ankle is normal, will get a x-ray, further advised depending on results RTC 3 months

## 2024-05-01 NOTE — Patient Instructions (Addendum)
   Go to the front desk for the checkout Please make an appointment to see me in   3 months     STOP BY THE FIRST FLOOR:  get the XR

## 2024-05-01 NOTE — Assessment & Plan Note (Signed)
 Shortness of breath: Last seen by me for symptomatic anemia 03/23/2024. Went to the ER 04/25/2024 with acute SOB associated with some difficulty swallowing.  No fever chills or cough. Workup: COVID test positive. Creatinine 1.7 slightly higher than usual.  Calcium  slightly low at 8.2.  Hemoglobin decreased further from 7.4-6.0.  Chest x-ray nonacute.  Received a transfusion x 2 per pt. At this point states she feels very well, essentially no shortness of breath.  I believe the SOB was related to anemia rather than COVID.  Nevertheless plan is to continue present care and keep the appointment with hematology in a couple of days.  Again I encouraged her to let them do the workup needed to provide her with the best possible treatment. Symptomatic macrocytic anemia, thrombocytopenia, neutropenia, B12 deficiency, elevated free light chains: See above. L foot pain: Started a couple of days ago, it is TTP at the lateral area with mild redness.  No obvious abscess, cuts or openings.  No known injury.  Ankle is normal, will get a x-ray, further advised depending on results RTC 3 months

## 2024-05-04 ENCOUNTER — Inpatient Hospital Stay (HOSPITAL_BASED_OUTPATIENT_CLINIC_OR_DEPARTMENT_OTHER): Admitting: Medical Oncology

## 2024-05-04 ENCOUNTER — Encounter: Payer: Self-pay | Admitting: Medical Oncology

## 2024-05-04 ENCOUNTER — Inpatient Hospital Stay: Attending: Medical Oncology

## 2024-05-04 ENCOUNTER — Inpatient Hospital Stay

## 2024-05-04 VITALS — BP 143/35 | HR 69 | Temp 98.7°F | Resp 19 | Ht 66.0 in | Wt 177.0 lb

## 2024-05-04 DIAGNOSIS — Z853 Personal history of malignant neoplasm of breast: Secondary | ICD-10-CM | POA: Insufficient documentation

## 2024-05-04 DIAGNOSIS — D539 Nutritional anemia, unspecified: Secondary | ICD-10-CM

## 2024-05-04 DIAGNOSIS — D72829 Elevated white blood cell count, unspecified: Secondary | ICD-10-CM | POA: Diagnosis not present

## 2024-05-04 DIAGNOSIS — D649 Anemia, unspecified: Secondary | ICD-10-CM

## 2024-05-04 DIAGNOSIS — D72819 Decreased white blood cell count, unspecified: Secondary | ICD-10-CM | POA: Insufficient documentation

## 2024-05-04 DIAGNOSIS — R768 Other specified abnormal immunological findings in serum: Secondary | ICD-10-CM

## 2024-05-04 DIAGNOSIS — E538 Deficiency of other specified B group vitamins: Secondary | ICD-10-CM | POA: Diagnosis not present

## 2024-05-04 DIAGNOSIS — Z923 Personal history of irradiation: Secondary | ICD-10-CM | POA: Insufficient documentation

## 2024-05-04 DIAGNOSIS — D696 Thrombocytopenia, unspecified: Secondary | ICD-10-CM | POA: Diagnosis not present

## 2024-05-04 LAB — CBC WITH DIFFERENTIAL (CANCER CENTER ONLY)
Abs Immature Granulocytes: 0.01 K/uL (ref 0.00–0.07)
Basophils Absolute: 0 K/uL (ref 0.0–0.1)
Basophils Relative: 1 %
Eosinophils Absolute: 0 K/uL (ref 0.0–0.5)
Eosinophils Relative: 2 %
HCT: 24.7 % — ABNORMAL LOW (ref 36.0–46.0)
Hemoglobin: 8.2 g/dL — ABNORMAL LOW (ref 12.0–15.0)
Immature Granulocytes: 1 %
Lymphocytes Relative: 45 %
Lymphs Abs: 0.9 K/uL (ref 0.7–4.0)
MCH: 34.3 pg — ABNORMAL HIGH (ref 26.0–34.0)
MCHC: 33.2 g/dL (ref 30.0–36.0)
MCV: 103.3 fL — ABNORMAL HIGH (ref 80.0–100.0)
Monocytes Absolute: 0.1 K/uL (ref 0.1–1.0)
Monocytes Relative: 5 %
Neutro Abs: 0.9 K/uL — ABNORMAL LOW (ref 1.7–7.7)
Neutrophils Relative %: 46 %
Platelet Count: 25 K/uL — ABNORMAL LOW (ref 150–400)
RBC: 2.39 MIL/uL — ABNORMAL LOW (ref 3.87–5.11)
RDW: 20.6 % — ABNORMAL HIGH (ref 11.5–15.5)
WBC Count: 2 K/uL — ABNORMAL LOW (ref 4.0–10.5)
nRBC: 0 % (ref 0.0–0.2)

## 2024-05-04 LAB — IRON AND IRON BINDING CAPACITY (CC-WL,HP ONLY)
Iron: 163 ug/dL (ref 28–170)
Saturation Ratios: 87 % — ABNORMAL HIGH (ref 10.4–31.8)
TIBC: 188 ug/dL — ABNORMAL LOW (ref 250–450)
UIBC: 25 ug/dL

## 2024-05-04 LAB — CMP (CANCER CENTER ONLY)
ALT: 17 U/L (ref 0–44)
AST: 20 U/L (ref 15–41)
Albumin: 4 g/dL (ref 3.5–5.0)
Alkaline Phosphatase: 59 U/L (ref 38–126)
Anion gap: 13 (ref 5–15)
BUN: 22 mg/dL (ref 8–23)
CO2: 24 mmol/L (ref 22–32)
Calcium: 8.3 mg/dL — ABNORMAL LOW (ref 8.9–10.3)
Chloride: 105 mmol/L (ref 98–111)
Creatinine: 1.37 mg/dL — ABNORMAL HIGH (ref 0.44–1.00)
GFR, Estimated: 38 mL/min — ABNORMAL LOW (ref >60)
Glucose, Bld: 171 mg/dL — ABNORMAL HIGH (ref 70–99)
Potassium: 3.6 mmol/L (ref 3.5–5.1)
Sodium: 142 mmol/L (ref 135–145)
Total Bilirubin: 0.8 mg/dL (ref 0.0–1.2)
Total Protein: 6.4 g/dL — ABNORMAL LOW (ref 6.5–8.1)

## 2024-05-04 LAB — LACTATE DEHYDROGENASE: LDH: 103 U/L (ref 98–192)

## 2024-05-04 LAB — VITAMIN B12: Vitamin B-12: 2919 pg/mL — ABNORMAL HIGH (ref 180–914)

## 2024-05-04 LAB — FOLATE: Folate: >20 ng/mL (ref >5.9)

## 2024-05-04 LAB — SAMPLE TO BLOOD BANK

## 2024-05-04 LAB — FERRITIN: Ferritin: 700 ng/mL — ABNORMAL HIGH (ref 11–307)

## 2024-05-04 NOTE — Progress Notes (Signed)
 Hematology and Oncology Follow Up Visit  Doris Mcdaniel [id number] 1936/05/17 88 y.o. 05/04/2024  Past Medical History:  Diagnosis Date   Abnormal x-ray of spine    Mild Compression deformity of the the T8 vertebral body, age indertiminate    Allergic rhinitis    GERD (gastroesophageal reflux disease)    History of breast cancer 2012   left breast   Hyperlipidemia    Hypertension    Personal history of radiation therapy     Principle Diagnosis:  - Anemia CKD Normal Erythropoietin  12/10/2023  Nutritional B12 Deficiency  - Thrombocytopenia   MDS FISH panel normal - 01/28/2024  BCR- ABL1 normal on 12/10/2023  Multiple Myeloma panel normal on 12/10/2023  UPEP normal on 01/07/2024  - Breast Cancer  - Left Breast- Treated with XRT- Pt unable to give additional information  Current Therapy:   Monitoring per patient request  B12 1,000 mcg twice daily      Interim History:  Ms. Doris Mcdaniel is back for follow-up. She is here with her husband.    Anemia started about 2 years ago with a value of 11.9 hgb. She then started to have some neutropenia as well which was noticed 4 months ago. Her last set of blood work 8 days ago showed a WBC of 3.4, ANC of 1.7, Hgb of 8.7, MCV of 108.7, platelets of 87. Creatinine 1.52, calcium  of 8.3     She discussed with me at her initial visit that she would be agreeable to lab work but wanted to hold off on any procedures such as a bone marrow biopsy. She also would like to hold off on blood transfusions or vitamin injections/infusions.   She did have a blood transfusion of 2 units on 04/25/2024 when she was seen in the ER for trouble swallowing and a diagnosis of COVID-19. At that time labs showed a CBC with leukocytopenia, white blood cells 1.7, anemia, hemoglobin 6.0, thrombocytopenia, platelets 35.   Today she reports that she has been ok. She notes no changes in how she feels after receiving the 2 units of blood in the hospital. She denies  any fevers, night sweats, new bone pains.   She continues to take her oral B12 and iron supplement. She has tolerated these well.   Still has fatigue  There has been no bleeding to her knowledge: denies epistaxis, gingivitis, hemoptysis, hematemesis, hematuria, melena, excessive bruising, blood donation.   No chest pain, peripheral edema, infections.   No new bone pains, no night sweats.  Weight is stable. Appetite is fair  Wt Readings from Last 3 Encounters:  05/04/24 177 lb (80.3 kg)  05/01/24 177 lb 2 oz (80.3 kg)  03/23/24 182 lb (82.6 kg)     Medications:   Current Outpatient Medications:    acetaminophen  (TYLENOL ) 500 MG tablet, Take 2 tablets (1,000 mg total) by mouth 2 (two) times daily as needed for mild pain or moderate pain., Disp: 30 tablet, Rfl: 0   alendronate  (FOSAMAX ) 70 MG tablet, Take 1 tablet (70 mg total) by mouth every 7 (seven) days. Take with a full glass of water on an empty stomach., Disp: 12 tablet, Rfl: 3   atorvastatin  (LIPITOR) 20 MG tablet, Take 1 tablet (20 mg total) by mouth at bedtime., Disp: 90 tablet, Rfl: 1   CALCIUM -VITAMIN D PO, Take by mouth., Disp: , Rfl:    carboxymethylcellulose (REFRESH PLUS) 0.5 % SOLN, Place 1 drop into both eyes daily as needed., Disp: , Rfl:  cyanocobalamin  1000 MCG tablet, Take 1,000 mcg by mouth 2 (two) times daily., Disp: , Rfl:    ferrous sulfate 325 (65 FE) MG EC tablet, Take 325 mg by mouth 3 (three) times daily with meals., Disp: , Rfl:    hydrochlorothiazide  (HYDRODIURIL ) 25 MG tablet, Take 1 tablet (25 mg total) by mouth daily., Disp: 90 tablet, Rfl: 1   lidocaine  (XYLOCAINE ) 2 % solution, Use as directed 15 mLs in the mouth or throat every 4 (four) hours as needed for mouth pain., Disp: 100 mL, Rfl: 0   lisinopril  (ZESTRIL ) 20 MG tablet, Take 1 tablet (20 mg total) by mouth daily., Disp: 90 tablet, Rfl: 1   loratadine  (CLARITIN ) 10 MG tablet, Take 1 tablet (10 mg total) by mouth daily., Disp: 90 tablet, Rfl:  3   Multiple Vitamin (MULTIVITAMIN) capsule, Take 1 capsule by mouth daily., Disp: , Rfl:    Multiple Vitamins-Minerals (PRESERVISION AREDS PO), Take by mouth., Disp: , Rfl:    verapamil  (CALAN -SR) 240 MG CR tablet, Take 1 tablet (240 mg total) by mouth at bedtime., Disp: 90 tablet, Rfl: 1  Allergies: No Known Allergies  Past Medical History, Surgical history, Social history, and Family History were reviewed and updated.  Review of Systems: Review of Systems  Constitutional: Negative.   HENT:  Negative.    Eyes: Negative.   Respiratory: Negative.    Cardiovascular: Negative.   Gastrointestinal: Negative.   Endocrine: Negative.   Genitourinary: Negative.    Musculoskeletal: Negative.   Skin: Negative.   Neurological: Negative.   Hematological: Negative.   Psychiatric/Behavioral: Negative.     Physical Exam:  height is 5' 6 (1.676 m) and weight is 177 lb (80.3 kg). Her oral temperature is 98.7 F (37.1 C). Her blood pressure is 143/35 (abnormal) and her pulse is 69. Her respiration is 19 and oxygen saturation is 100%.   Physical Exam General: NAD. Alert and oriented to person, place, time and situation  HEENT: Conjunctiva with mild pallor Cardiovascular: regular rate and rhythm Pulmonary: clear ant fields Abdomen: soft, nontender Extremities: no edema, no joint deformities Skin: no rashes, no bruising noted  Neurological: Weakness but otherwise nonfocal   Lab Results  Component Value Date   WBC 2.0 (L) 05/04/2024   HGB 8.2 (L) 05/04/2024   HCT 24.7 (L) 05/04/2024   MCV 103.3 (H) 05/04/2024   PLT 25 (L) 05/04/2024     Chemistry      Component Value Date/Time   NA 142 05/04/2024 1115   K 3.6 05/04/2024 1115   CL 105 05/04/2024 1115   CO2 24 05/04/2024 1115   BUN 22 05/04/2024 1115   CREATININE 1.37 (H) 05/04/2024 1115      Component Value Date/Time   CALCIUM  8.3 (L) 05/04/2024 1115   ALKPHOS 59 05/04/2024 1115   AST 20 05/04/2024 1115   ALT 17 05/04/2024  1115   BILITOT 0.8 05/04/2024 1115     Encounter Diagnoses  Name Primary?   Symptomatic anemia Yes   Thrombocytopenia (HCC)    Leukocytosis, unspecified type     Assessment and Plan- Patient is a 88 y.o. female who is followed by our office for thrombocytopenia, neutropenia, macrocytic anemia and elevated free light chains. She again makes it very clear to myself and her husband that she does not wish to have anything done that could cause pain or harm. She states that she feels as if her body is naturally shutting down and that she is ok with that. She confirms  her treatment and evaluation options but declines stating that she would still like to be monitored every month or so as her schedule allows. She would like to continue with current lab monitoring to maximize her time and in case she elects to have a blood transfusion in the future.   Again, she appears to have fully autonomy and I respect her wishes. I discussed with her and her husband that I wanted to make sure that they were well informed that if they did not elect to treat her lab abnormalities, they would likely continue to worsen, leading eventually to death.   Today her WBC is 2 with ANC of 0.9, Hgb is 8.2 and platelets are 25 Nutritional labs pending  I have reviewed her CBC and CMP with her today. Again we discussed that her low Hgb level, WBC and low platelets could cause complications including death. Bleeding and neutropenia precautions and red flags discussed. After a long discussion she would like to be able to attend her grand daughter wedding in November. Because of this she is agreeable to further work up.   She is agreeable to undergo a bone marrow biopsy. The order for this has been placed.   B12/iron labs pending. At this time she declined IM injections or IV iron. She declines GI work up despite Music therapist.   I spent 30 minutes dedicated to the care of this patient (face to face and non-face to face) on the  date of this encounter to include the discussions above.    Disposition: Bone marrow biopsy order placed RTC ~2 weeks Dr. Timmy in a new patient visit slot, labs    Lauraine Dais PA-C 9/22/20251:06 PM

## 2024-05-05 ENCOUNTER — Ambulatory Visit: Payer: Self-pay | Admitting: Medical Oncology

## 2024-05-05 DIAGNOSIS — H353211 Exudative age-related macular degeneration, right eye, with active choroidal neovascularization: Secondary | ICD-10-CM | POA: Diagnosis not present

## 2024-05-05 NOTE — Telephone Encounter (Signed)
-----   Message from Lauraine CHRISTELLA Dais sent at 05/05/2024 11:43 AM EDT ----- Have them stop her B12 and iron supplements (levels are high after her 2 transfusions) These values will trend downward with time ----- Message ----- From: Interface, Lab In [place] Sent: 05/04/2024  11:51 AM EDT To: Lauraine CHRISTELLA Dais, PA-C

## 2024-05-05 NOTE — Telephone Encounter (Signed)
 As noted below by Lauraine [facility], I left a message for patient to stop her Vitamin B 12 and iron supplements. These levels are high after your two blood transfusions. These values will trend downward with time. If you have any questions or concerns, please call the office.

## 2024-05-09 ENCOUNTER — Ambulatory Visit: Payer: Self-pay | Admitting: Internal Medicine

## 2024-05-26 ENCOUNTER — Other Ambulatory Visit: Payer: Self-pay | Admitting: *Deleted

## 2024-05-26 ENCOUNTER — Inpatient Hospital Stay

## 2024-05-26 ENCOUNTER — Ambulatory Visit (HOSPITAL_COMMUNITY)
Admission: RE | Admit: 2024-05-26 | Discharge: 2024-05-26 | Disposition: A | Discharge status: Home / Self Care | Source: Ambulatory Visit | Attending: Medical Oncology | Admitting: Medical Oncology

## 2024-05-26 ENCOUNTER — Other Ambulatory Visit: Payer: Self-pay

## 2024-05-26 ENCOUNTER — Inpatient Hospital Stay: Attending: Medical Oncology

## 2024-05-26 ENCOUNTER — Encounter (HOSPITAL_COMMUNITY): Payer: Self-pay

## 2024-05-26 VITALS — BP 137/58 | HR 61 | Temp 98.3°F | Resp 17

## 2024-05-26 VITALS — BP 126/46 | HR 65 | Temp 98.0°F | Resp 16 | Ht 66.0 in | Wt 177.0 lb

## 2024-05-26 DIAGNOSIS — D649 Anemia, unspecified: Secondary | ICD-10-CM | POA: Diagnosis not present

## 2024-05-26 DIAGNOSIS — Z7983 Long term (current) use of bisphosphonates: Secondary | ICD-10-CM | POA: Insufficient documentation

## 2024-05-26 DIAGNOSIS — D696 Thrombocytopenia, unspecified: Secondary | ICD-10-CM | POA: Diagnosis not present

## 2024-05-26 DIAGNOSIS — Z853 Personal history of malignant neoplasm of breast: Secondary | ICD-10-CM | POA: Diagnosis not present

## 2024-05-26 DIAGNOSIS — D72829 Elevated white blood cell count, unspecified: Secondary | ICD-10-CM

## 2024-05-26 DIAGNOSIS — Z79899 Other long term (current) drug therapy: Secondary | ICD-10-CM | POA: Insufficient documentation

## 2024-05-26 DIAGNOSIS — I10 Essential (primary) hypertension: Secondary | ICD-10-CM | POA: Diagnosis not present

## 2024-05-26 DIAGNOSIS — D61818 Other pancytopenia: Secondary | ICD-10-CM | POA: Diagnosis not present

## 2024-05-26 DIAGNOSIS — Z01818 Encounter for other preprocedural examination: Secondary | ICD-10-CM | POA: Diagnosis not present

## 2024-05-26 DIAGNOSIS — Z923 Personal history of irradiation: Secondary | ICD-10-CM | POA: Diagnosis not present

## 2024-05-26 DIAGNOSIS — E785 Hyperlipidemia, unspecified: Secondary | ICD-10-CM | POA: Insufficient documentation

## 2024-05-26 LAB — CBC WITH DIFFERENTIAL/PLATELET
Abs Immature Granulocytes: 0.01 K/uL (ref 0.00–0.07)
Basophils Absolute: 0 K/uL (ref 0.0–0.1)
Basophils Relative: 0 %
Eosinophils Absolute: 0 K/uL (ref 0.0–0.5)
Eosinophils Relative: 2 %
HCT: 19.3 % — ABNORMAL LOW (ref 36.0–46.0)
Hemoglobin: 6.1 g/dL — CL (ref 12.0–15.0)
Immature Granulocytes: 0 %
Lymphocytes Relative: 34 %
Lymphs Abs: 0.8 K/uL (ref 0.7–4.0)
MCH: 35.1 pg — ABNORMAL HIGH (ref 26.0–34.0)
MCHC: 31.6 g/dL (ref 30.0–36.0)
MCV: 110.9 fL — ABNORMAL HIGH (ref 80.0–100.0)
Monocytes Absolute: 0.2 K/uL (ref 0.1–1.0)
Monocytes Relative: 7 %
Neutro Abs: 1.4 K/uL — ABNORMAL LOW (ref 1.7–7.7)
Neutrophils Relative %: 57 %
Platelets: 36 K/uL — ABNORMAL LOW (ref 150–400)
RBC: 1.74 MIL/uL — ABNORMAL LOW (ref 3.87–5.11)
RDW: 22.2 % — ABNORMAL HIGH (ref 11.5–15.5)
Smear Review: NORMAL
WBC: 2.4 K/uL — ABNORMAL LOW (ref 4.0–10.5)
nRBC: 0.8 % — ABNORMAL HIGH (ref 0.0–0.2)

## 2024-05-26 LAB — SAMPLE TO BLOOD BANK

## 2024-05-26 MED ORDER — DIPHENHYDRAMINE HCL 25 MG PO CAPS
25.0000 mg | ORAL_CAPSULE | Freq: Once | ORAL | Status: AC
  Administered 2024-05-26: 25 mg via ORAL
  Filled 2024-05-26: qty 1

## 2024-05-26 MED ORDER — ACETAMINOPHEN 325 MG PO TABS
650.0000 mg | ORAL_TABLET | Freq: Once | ORAL | Status: AC
  Administered 2024-05-26: 650 mg via ORAL
  Filled 2024-05-26: qty 2

## 2024-05-26 MED ORDER — SODIUM CHLORIDE 0.9% IV SOLUTION
250.0000 mL | INTRAVENOUS | Status: DC
  Administered 2024-05-26: 250 mL via INTRAVENOUS

## 2024-05-26 MED ORDER — SODIUM CHLORIDE 0.9 % IV SOLN: INTRAVENOUS | Status: DC

## 2024-05-26 MED ORDER — FENTANYL CITRATE (PF) 100 MCG/2ML IJ SOLN
INTRAMUSCULAR | Status: AC | PRN
  Administered 2024-05-26 (×2): 25 ug via INTRAVENOUS

## 2024-05-26 MED ORDER — MIDAZOLAM HCL 2 MG/2ML IJ SOLN
INTRAMUSCULAR | Status: AC
  Filled 2024-05-26: qty 2

## 2024-05-26 MED ORDER — FENTANYL CITRATE (PF) 100 MCG/2ML IJ SOLN
INTRAMUSCULAR | Status: AC
  Filled 2024-05-26: qty 2

## 2024-05-26 MED ORDER — MIDAZOLAM HCL 2 MG/2ML IJ SOLN
INTRAMUSCULAR | Status: AC | PRN
  Administered 2024-05-26 (×3): .5 mg via INTRAVENOUS

## 2024-05-26 NOTE — H&P (Signed)
 Chief Complaint: Pancytopenia  Referring Provider(s): Covington,Sarah M   Supervising Physician: Philip Cornet  Patient Status: [facility] - Out-pt  History of Present Illness: Doris Mcdaniel is a 88 y.o. female history of L breast cancer treated with XRT. She is currently being followed by heme/onc for pancytopenia which has required blood transfusions. She has undergone extensive blood work with largely normal mult myeloma, FISH, UPEP panels. She presents today for BMBx for further work up.   Confirms NPO since MN and ride/supervision available for 24 hours.  Does not wear CPAP or use supplemental home O2. She does have upper and lower dentures.   Denies fever, chills, CP, sore throat, N/V, abd pain, blood in urine, abnormal bruising, leg swelling, back pain. Endorses dyspnea with exertion, fatigue, dark stools (has been evaluated as related to iron supplement).  Allergies Reviewed:  Patient has no known allergies.   Patient is Full Code  Past Medical History:  Diagnosis Date   Abnormal x-ray of spine    Mild Compression deformity of the the T8 vertebral body, age indertiminate    Allergic rhinitis    GERD (gastroesophageal reflux disease)    History of breast cancer 2012   left breast   Hyperlipidemia    Hypertension    Personal history of radiation therapy     Past Surgical History:  Procedure Laterality Date   ABDOMINAL HYSTERECTOMY     BREAST BIOPSY Left 01/24/2011   BREAST LUMPECTOMY Left 02/21/2011   BREAST SURGERY     lumpectomy (L)   CATARACT EXTRACTION     B   CHOLECYSTECTOMY  03/2008   OOPHORECTOMY     they left a part of the ovarly      Medications: Prior to Admission medications   Medication Sig Start Date End Date Taking? Authorizing Provider  alendronate  (FOSAMAX ) 70 MG tablet Take 1 tablet (70 mg total) by mouth every 7 (seven) days. Take with a full glass of water on an empty stomach. 08/27/23  Yes Paz, Aloysius BRAVO, MD  atorvastatin  (LIPITOR) 20  MG tablet Take 1 tablet (20 mg total) by mouth at bedtime. 01/24/24  Yes Amon Aloysius BRAVO, MD  CALCIUM -VITAMIN D PO Take by mouth.   Yes [provider]  carboxymethylcellulose (REFRESH PLUS) 0.5 % SOLN Place 1 drop into both eyes daily as needed.   Yes [provider]  cyanocobalamin  1000 MCG tablet Take 1,000 mcg by mouth 2 (two) times daily.   Yes [provider]  ferrous sulfate 325 (65 FE) MG EC tablet Take 325 mg by mouth 3 (three) times daily with meals.   Yes [provider]  hydrochlorothiazide  (HYDRODIURIL ) 25 MG tablet Take 1 tablet (25 mg total) by mouth daily. 01/24/24  Yes Paz, Aloysius BRAVO, MD  lidocaine  (XYLOCAINE ) 2 % solution Use as directed 15 mLs in the mouth or throat every 4 (four) hours as needed for mouth pain. 04/25/24  Yes Prosperi, Christian H, PA-C  lisinopril  (ZESTRIL ) 20 MG tablet Take 1 tablet (20 mg total) by mouth daily. 01/24/24  Yes Paz, Aloysius BRAVO, MD  loratadine  (CLARITIN ) 10 MG tablet Take 1 tablet (10 mg total) by mouth daily. 12/28/13  Yes Paz, Jose E, MD  Multiple Vitamin (MULTIVITAMIN) capsule Take 1 capsule by mouth daily.   Yes [provider]  Multiple Vitamins-Minerals (PRESERVISION AREDS PO) Take by mouth.   Yes [provider]  verapamil  (CALAN -SR) 240 MG CR tablet Take 1 tablet (240 mg total) by mouth at  bedtime. 01/24/24  Yes Paz, Jose E, MD  acetaminophen  (TYLENOL ) 500 MG tablet Take 2 tablets (1,000 mg total) by mouth 2 (two) times daily as needed for mild pain or moderate pain. 10/10/21   Carla Milling, RPH-CPP     Family History  Problem Relation Age of Onset   Heart disease Brother        CABG age ~ 67   Colon cancer Neg Hx    Breast cancer Neg Hx    Stroke Neg Hx    Hypertension Neg Hx    Diabetes Neg Hx     Social History   Socioeconomic History   Marital status: Married    Spouse name: Not on file   Number of children: 2   Years of education: Not on file   Highest education level: Not on file   Occupational History   Occupation: retired  Tobacco Use   Smoking status: Former    Current packs/day: 0.00    Types: Cigarettes    Quit date: 07/08/1973    Years since quitting: 50.9   Smokeless tobacco: Never   Tobacco comments:    used to smoke 1 PPD  Vaping Use   Vaping status: Never Used  Substance and Sexual Activity   Alcohol use: No   Drug use: No   Sexual activity: Not Currently  Other Topics Concern   Not on file  Social History Narrative   Lives w/ husband . 2 children live near by   Social Drivers of Health   Financial Resource Strain: Low Risk  (03/19/2024)   Overall Financial Resource Strain (CARDIA)    Difficulty of Paying Living Expenses: Not hard at all  Food Insecurity: No Food Insecurity (03/19/2024)   Hunger Vital Sign    Worried About Running Out of Food in the Last Year: Never true    Ran Out of Food in the Last Year: Never true  Transportation Needs: No Transportation Needs (03/19/2024)   PRAPARE - Administrator, Civil Service (Medical): No    Lack of Transportation (Non-Medical): No  Physical Activity: Insufficiently Active (03/19/2024)   Exercise Vital Sign    Days of Exercise per Week: 1 day    Minutes of Exercise per Session: 10 min  Stress: No Stress Concern Present (03/19/2024)   [employer] of Occupational Health - Occupational Stress Questionnaire    Feeling of Stress: Not at all  Social Connections: Moderately Isolated (03/19/2024)   Social Connection and Isolation Panel    Frequency of Communication with Friends and Family: Three times a week    Frequency of Social Gatherings with Friends and Family: Never    Attends Religious Services: Never    Database administrator or Organizations: No    Attends Engineer, structural: Never    Marital Status: Married     Review of Systems: A 12 point ROS discussed and pertinent positives are indicated in the HPI above.  All other systems are negative.    Vital Signs: BP (!)  137/48   Pulse 71   Temp 98.1 F (36.7 C) (Oral)   Resp 20   Ht 5' 6 (1.676 m)   Wt 177 lb (80.3 kg)   SpO2 98%   BMI 28.57 kg/m   Advance Care Plan: No documents on file    Physical Exam HENT:     Head:     Comments: Glasses, upper and lower dentures    Mouth/Throat:     Mouth:  Mucous membranes are moist.     Pharynx: Oropharynx is clear.  Cardiovascular:     Rate and Rhythm: Normal rate and regular rhythm.     Pulses: Normal pulses.     Heart sounds: Normal heart sounds.  Pulmonary:     Effort: Pulmonary effort is normal.  Abdominal:     Palpations: Abdomen is soft.  Musculoskeletal:     Right lower leg: No edema.     Left lower leg: No edema.  Skin:    General: Skin is warm and dry.     Comments: No rash or lesion over planned puncture  Neurological:     Mental Status: She is alert and oriented to person, place, and time.  Psychiatric:        Mood and Affect: Mood normal.        Behavior: Behavior normal.        Thought Content: Thought content normal.     Imaging: DG Foot Complete Left Result Date: 05/07/2024 CLINICAL DATA:  L foot pain lateral, denies injury Pain across the top of her foot, no known injury hurts to put weight on foot EXAM: LEFT FOOT - COMPLETE 3+ VIEW COMPARISON:  None Available. FINDINGS: There is no evidence of fracture or dislocation. Degenerative changes of the proximal and distal interphalangeal joints. Other focal bone abnormality. Soft tissues are unremarkable. IMPRESSION: No acute displaced fracture or dislocation. Electronically Signed   By: Morgane  Naveau M.D.   On: 05/07/2024 19:55    Labs:  CBC: Recent Labs    01/28/24 1054 03/10/24 1032 04/25/24 0703 05/04/24 1115  WBC 2.6* 2.8* 1.7* 2.0*  HGB 7.6* 7.4* 6.0* 8.2*  HCT 23.4* 22.5* 18.7* 24.7*  PLT 70* 53* 35* 25*    COAGS: No results for input(s): INR, APTT in the last 8760 hours.  BMP: Recent Labs    01/28/24 1054 03/10/24 1032 04/25/24 0703  05/04/24 1115  NA 141 143 138 142  K 3.7 4.0 3.5 3.6  CL 105 105 103 105  CO2 27 25 21* 24  GLUCOSE 104* 123* 126* 171*  BUN 24* 24* 24* 22  CALCIUM  9.4 8.8* 8.2* 8.3*  CREATININE 1.44* 1.62* 1.75* 1.37*  GFRNONAA 36* 31* 28* 38*    LIVER FUNCTION TESTS: Recent Labs    01/07/24 1055 01/28/24 1054 03/10/24 1032 05/04/24 1115  BILITOT 0.7 0.6 0.5 0.8  AST 15 12* 18 20  ALT 9 16 12 17   ALKPHOS 58 61 70 59  PROT 6.4* 6.6 6.7 6.4*  ALBUMIN 4.4 4.2 4.3 4.0    TUMOR MARKERS: No results for input(s): AFPTM, CEA, CA199, CHROMGRNA in the last 8760 hours.  Assessment and Plan:  Request for  image guided BMBx and asp approved with Dr. Philip for 10/14. No contraindications for procedure identified in ROS, physical exam, or review of pre-sedation considerations. CBC w/ diff drawn prior to procedure.  VSS, afebrile Patient not asked to hold any AC/AP for this low bleeding risk procedure Abx not indicated    Risks and benefits of bone marrow biopsy and aspiration was discussed with the patient and/or patient's family including, but not limited to bleeding, infection, damage to adjacent structures or low yield requiring additional tests.  All of the questions were answered and there is agreement to proceed.  Consent signed and in chart.   Thank you for allowing our service to participate in Doris Mcdaniel 's care.    Electronically Signed: Laymon Coast, NP   05/26/2024, 8:51 AM  I spent a total of  15 Minutes   in face to face in clinical consultation, greater than 50% of which was counseling/coordinating care for imaged guided BMBx and asp.    (A copy of this note was sent to the referring provider and the time of visit.)

## 2024-05-26 NOTE — Patient Instructions (Signed)

## 2024-05-26 NOTE — Progress Notes (Signed)
 1100 Dr. Philip in to see Hgb 6.1 and Hct 19.3 spoke with the staff at the [facility] for the possibility of a  blood transfusion today. The Infusion area will make contact back with Dr. Philip after arrangement is made.

## 2024-05-26 NOTE — Progress Notes (Signed)
 64 Spoke with Dr. Philip Doris Mcdaniel can be [facility] to the [facility] for the blood transfusion today. 1206 Spoke with the [facility] Infusion Center  has a appointment time for 1300 for one unit of blood.

## 2024-05-26 NOTE — Discharge Instructions (Addendum)
Discharge Instructions:   Please call Interventional Radiology clinic [phone] with any questions or concerns.  You may remove your dressing and shower tomorrow.    Bone Marrow Aspiration and Bone Marrow Biopsy, Adult, Care After This sheet gives you information about how to care for yourself after your procedure. Your health care provider may also give you more specific instructions. If you have problems or questions, contact your health care provider. What can I expect after the procedure? After the procedure, it is common to have: Mild pain and tenderness. Swelling. Bruising. Follow these instructions at home: Puncture site care  Follow instructions from your health care provider about how to take care of the puncture site. Make sure you: Wash your hands with soap and water before and after you change your bandage (dressing). If soap and water are not available, use hand sanitizer. Change your dressing as told by your health care provider. Check your puncture site every day for signs of infection. Check for: More redness, swelling, or pain. Fluid or blood. Warmth. Pus or a bad smell. Activity Return to your normal activities as told by your health care provider. Ask your health care provider what activities are safe for you. Do not lift anything that is heavier than 10 lb (4.5 kg), or the limit that you are told, until your health care provider says that it is safe. Do not drive for 24 hours if you were given a sedative during your procedure. General instructions  Take over-the-counter and prescription medicines only as told by your health care provider. Do not take baths, swim, or use a hot tub until your health care provider approves. Ask your health care provider if you may take showers. You may only be allowed to take sponge baths. If directed, put ice on the affected area. To do this: Put ice in a plastic bag. Place a towel between your skin and the bag. Leave the ice  on for 20 minutes, 2-3 times a day. Keep all follow-up visits as told by your health care provider. This is important. Contact a health care provider if: Your pain is not controlled with medicine. You have a fever. You have more redness, swelling, or pain around the puncture site. You have fluid or blood coming from the puncture site. Your puncture site feels warm to the touch. You have pus or a bad smell coming from the puncture site. Summary After the procedure, it is common to have mild pain, tenderness, swelling, and bruising. Follow instructions from your health care provider about how to take care of the puncture site and what activities are safe for you. Take over-the-counter and prescription medicines only as told by your health care provider. Contact a health care provider if you have any signs of infection, such as fluid or blood coming from the puncture site. This information is not intended to replace advice given to you by your health care provider. Make sure you discuss any questions you have with your health care provider. Document Revised: 12/16/2018 Document Reviewed: 12/16/2018 Elsevier Patient Education  2023 Else[employer] Inc.   Moderate Conscious Sedation, Adult, Care After This sheet gives you information about how to care for yourself after your procedure. Your health care provider may also give you more specific instructions. If you have problems or questions, contact your health care provider. What can I expect after the procedure? After the procedure, it is common to have: Sleepiness for several hours. Impaired judgment for several hours. Difficulty with balance. Vomiting if   you eat too soon. Follow these instructions at home: For the time period you were told by your health care provider: Rest. Do not participate in activities where you could fall or become injured. Do not drive or use machinery. Do not drink alcohol. Do not take sleeping pills or medicines that  cause drowsiness. Do not make important decisions or sign legal documents. Do not take care of children on your own. Eating and drinking  Follow the diet recommended by your health care provider. Drink enough fluid to keep your urine pale yellow. If you vomit: Drink water, juice, or soup when you can drink without vomiting. Make sure you have little or no nausea before eating solid foods. General instructions Take over-the-counter and prescription medicines only as told by your health care provider. Have a responsible adult stay with you for the time you are told. It is important to have someone help care for you until you are awake and alert. Do not smoke. Keep all follow-up visits as told by your health care provider. This is important. Contact a health care provider if: You are still sleepy or having trouble with balance after 24 hours. You feel light-headed. You keep feeling nauseous or you keep vomiting. You develop a rash. You have a fever. You have redness or swelling around the IV site. Get help right away if: You have trouble breathing. You have new-onset confusion at home. Summary After the procedure, it is common to feel sleepy, have impaired judgment, or feel nauseous if you eat too soon. Rest after you get home. Know the things you should not do after the procedure. Follow the diet recommended by your health care provider and drink enough fluid to keep your urine pale yellow. Get help right away if you have trouble breathing or new-onset confusion at home. This information is not intended to replace advice given to you by your health care provider. Make sure you discuss any questions you have with your health care provider. Document Revised: 11/27/2019 Document Reviewed: 06/25/2019 Elsevier Patient Education  2023 Elsevier Inc.  

## 2024-05-26 NOTE — Procedures (Signed)
Interventional Radiology Procedure:   Indications: Pancytopenia  Procedure: CT guided bone marrow biopsy  Findings: 2 aspirates and 1 core from right ilium  Complications: None     EBL: Minimal, less than 10 ml  Plan: Discharge to home in one hour.    R. , MD  Pager: [phone]    

## 2024-05-27 ENCOUNTER — Inpatient Hospital Stay

## 2024-05-27 DIAGNOSIS — D649 Anemia, unspecified: Secondary | ICD-10-CM

## 2024-05-27 DIAGNOSIS — D61818 Other pancytopenia: Secondary | ICD-10-CM | POA: Diagnosis not present

## 2024-05-27 LAB — PREPARE RBC (CROSSMATCH)

## 2024-05-27 MED ORDER — ACETAMINOPHEN 325 MG PO TABS
650.0000 mg | ORAL_TABLET | Freq: Once | ORAL | Status: AC
  Administered 2024-05-27: 650 mg via ORAL
  Filled 2024-05-27: qty 2

## 2024-05-27 MED ORDER — DIPHENHYDRAMINE HCL 25 MG PO CAPS
25.0000 mg | ORAL_CAPSULE | Freq: Once | ORAL | Status: AC
  Administered 2024-05-27: 25 mg via ORAL
  Filled 2024-05-27: qty 1

## 2024-05-27 MED ORDER — SODIUM CHLORIDE 0.9% IV SOLUTION
250.0000 mL | INTRAVENOUS | Status: DC
  Administered 2024-05-27: 250 mL via INTRAVENOUS

## 2024-05-27 NOTE — Patient Instructions (Signed)

## 2024-05-28 LAB — TYPE AND SCREEN
ABO/RH(D): O NEG
Antibody Screen: NEGATIVE
Unit division: 0
Unit division: 0

## 2024-05-28 LAB — BPAM RBC
Blood Product Expiration Date: 202511182359
Blood Product Expiration Date: 202511182359
ISSUE DATE / TIME: 202510141436
ISSUE DATE / TIME: 202510150744
Unit Type and Rh: 202511182359
Unit Type and Rh: 9500
Unit Type and Rh: 9500

## 2024-05-28 LAB — SURGICAL PATHOLOGY

## 2024-05-29 LAB — SURGICAL PATHOLOGY

## 2024-06-01 ENCOUNTER — Ambulatory Visit (HOSPITAL_COMMUNITY)

## 2024-06-01 ENCOUNTER — Other Ambulatory Visit (HOSPITAL_COMMUNITY)

## 2024-06-02 ENCOUNTER — Ambulatory Visit: Payer: Self-pay | Admitting: Hematology & Oncology

## 2024-06-02 NOTE — Telephone Encounter (Signed)
Called and informed patient of results, patient verbalized understanding and denies any questions or concerns at this time.  ? ?

## 2024-06-02 NOTE — Telephone Encounter (Signed)
-----   Message from Maude JONELLE Crease sent at 06/02/2024  6:04 AM EDT ----- Please call and let him know that there is no obvious abnormality in the bone marrow.  There is no obvious cancer.  Thanks.SABRA ----- Message ----- From: Tonette Lauraine CHRISTELLA DEVONNA Sent: 06/01/2024   3:52 PM EDT To: Maude JONELLE Crease, MD   ----- Message ----- From: Interface, Lab In Three Zero One Sent: 05/28/2024   3:17 PM EDT To: Lauraine CHRISTELLA Tonette, PA-C

## 2024-06-04 ENCOUNTER — Encounter (HOSPITAL_COMMUNITY): Payer: Self-pay | Admitting: Medical Oncology

## 2024-06-09 DIAGNOSIS — H353122 Nonexudative age-related macular degeneration, left eye, intermediate dry stage: Secondary | ICD-10-CM | POA: Diagnosis not present

## 2024-06-09 DIAGNOSIS — Z961 Presence of intraocular lens: Secondary | ICD-10-CM | POA: Diagnosis not present

## 2024-06-09 DIAGNOSIS — H43813 Vitreous degeneration, bilateral: Secondary | ICD-10-CM | POA: Diagnosis not present

## 2024-06-09 DIAGNOSIS — H35033 Hypertensive retinopathy, bilateral: Secondary | ICD-10-CM | POA: Diagnosis not present

## 2024-06-09 DIAGNOSIS — H353211 Exudative age-related macular degeneration, right eye, with active choroidal neovascularization: Secondary | ICD-10-CM | POA: Diagnosis not present

## 2024-06-10 ENCOUNTER — Encounter: Payer: Self-pay | Admitting: Hematology & Oncology

## 2024-06-10 ENCOUNTER — Inpatient Hospital Stay

## 2024-06-10 ENCOUNTER — Inpatient Hospital Stay (HOSPITAL_BASED_OUTPATIENT_CLINIC_OR_DEPARTMENT_OTHER): Admitting: Hematology & Oncology

## 2024-06-10 ENCOUNTER — Other Ambulatory Visit: Payer: Self-pay

## 2024-06-10 VITALS — BP 137/42 | HR 77 | Temp 97.6°F | Resp 20 | Ht 66.0 in | Wt 183.0 lb

## 2024-06-10 DIAGNOSIS — D72829 Elevated white blood cell count, unspecified: Secondary | ICD-10-CM

## 2024-06-10 DIAGNOSIS — D696 Thrombocytopenia, unspecified: Secondary | ICD-10-CM

## 2024-06-10 DIAGNOSIS — D61818 Other pancytopenia: Secondary | ICD-10-CM | POA: Insufficient documentation

## 2024-06-10 DIAGNOSIS — D539 Nutritional anemia, unspecified: Secondary | ICD-10-CM

## 2024-06-10 DIAGNOSIS — D631 Anemia in chronic kidney disease: Secondary | ICD-10-CM | POA: Diagnosis not present

## 2024-06-10 DIAGNOSIS — R7689 Other specified abnormal immunological findings in serum: Secondary | ICD-10-CM

## 2024-06-10 DIAGNOSIS — D649 Anemia, unspecified: Secondary | ICD-10-CM

## 2024-06-10 LAB — CBC WITH DIFFERENTIAL (CANCER CENTER ONLY)
Abs Immature Granulocytes: 0 K/uL (ref 0.00–0.07)
Basophils Absolute: 0 K/uL (ref 0.0–0.1)
Basophils Relative: 1 %
Eosinophils Absolute: 0.1 K/uL (ref 0.0–0.5)
Eosinophils Relative: 3 %
HCT: 25.4 % — ABNORMAL LOW (ref 36.0–46.0)
Hemoglobin: 8.6 g/dL — ABNORMAL LOW (ref 12.0–15.0)
Immature Granulocytes: 0 %
Lymphocytes Relative: 40 %
Lymphs Abs: 0.9 K/uL (ref 0.7–4.0)
MCH: 34.4 pg — ABNORMAL HIGH (ref 26.0–34.0)
MCHC: 33.9 g/dL (ref 30.0–36.0)
MCV: 101.6 fL — ABNORMAL HIGH (ref 80.0–100.0)
Monocytes Absolute: 0.2 K/uL (ref 0.1–1.0)
Monocytes Relative: 8 %
Neutro Abs: 1.1 K/uL — ABNORMAL LOW (ref 1.7–7.7)
Neutrophils Relative %: 48 %
Platelet Count: 41 K/uL — ABNORMAL LOW (ref 150–400)
RBC: 2.5 MIL/uL — ABNORMAL LOW (ref 3.87–5.11)
RDW: 21.1 % — ABNORMAL HIGH (ref 11.5–15.5)
WBC Count: 2.2 K/uL — ABNORMAL LOW (ref 4.0–10.5)
nRBC: 0 % (ref 0.0–0.2)

## 2024-06-10 LAB — IRON AND IRON BINDING CAPACITY (CC-WL,HP ONLY)
Iron: 190 ug/dL — ABNORMAL HIGH (ref 28–170)
Saturation Ratios: 81 % — ABNORMAL HIGH (ref 10.4–31.8)
TIBC: 234 ug/dL — ABNORMAL LOW (ref 250–450)
UIBC: 44 ug/dL

## 2024-06-10 LAB — RETIC PANEL
Immature Retic Fract: 23.4 % — ABNORMAL HIGH (ref 2.3–15.9)
RBC.: 2.53 MIL/uL — ABNORMAL LOW (ref 3.87–5.11)
Retic Count, Absolute: 42.5 K/uL (ref 19.0–186.0)
Retic Ct Pct: 1.7 % (ref 0.4–3.1)
Reticulocyte Hemoglobin: 40.5 pg (ref >27.9)

## 2024-06-10 LAB — SAVE SMEAR(SSMR), FOR PROVIDER SLIDE REVIEW

## 2024-06-10 LAB — LACTATE DEHYDROGENASE: LDH: 107 U/L (ref 98–192)

## 2024-06-10 LAB — CMP (CANCER CENTER ONLY)
ALT: 16 U/L (ref 0–44)
AST: 19 U/L (ref 15–41)
Albumin: 4 g/dL (ref 3.5–5.0)
Alkaline Phosphatase: 71 U/L (ref 38–126)
Anion gap: 12 (ref 5–15)
BUN: 25 mg/dL — ABNORMAL HIGH (ref 8–23)
CO2: 23 mmol/L (ref 22–32)
Calcium: 8.3 mg/dL — ABNORMAL LOW (ref 8.9–10.3)
Chloride: 105 mmol/L (ref 98–111)
Creatinine: 1.35 mg/dL — ABNORMAL HIGH (ref 0.44–1.00)
GFR, Estimated: 38 mL/min — ABNORMAL LOW (ref >60)
Glucose, Bld: 134 mg/dL — ABNORMAL HIGH (ref 70–99)
Potassium: 4 mmol/L (ref 3.5–5.1)
Sodium: 140 mmol/L (ref 135–145)
Total Bilirubin: 0.5 mg/dL (ref 0.0–1.2)
Total Protein: 6.6 g/dL (ref 6.5–8.1)

## 2024-06-10 LAB — FERRITIN: Ferritin: 725 ng/mL — ABNORMAL HIGH (ref 11–307)

## 2024-06-10 LAB — TYPE AND SCREEN
ABO/RH(D): O NEG
Antibody Screen: NEGATIVE

## 2024-06-10 LAB — VITAMIN B12: Vitamin B-12: 2005 pg/mL — ABNORMAL HIGH (ref 180–914)

## 2024-06-10 NOTE — Progress Notes (Signed)
 Hematology and Oncology Follow Up Visit  Doris Mcdaniel [id number] 23-Oct-1935 88 y.o. 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 She is here with her husband.  We had a bone marrow biopsy done on her.  This was done because of her pancytopenia.  Laboratory studies that were done are shown erythropoietin  level 125.  She does have mild renal sufficiency with a GFR of 38 mL/min.  She clearly does not have any problems with iron deficiency.  Her ferritin was 700 with iron saturation of 87%.  Back in June she had a MDS panel that was done.  This was relatively normal.  She had a vitamin B12 level of 2919 pg/mL.  Finally, she has very low reticulocyte count.  Corrected, the reticulocyte count is probably about 1%..  We did go ahead and do a bone marrow biopsy on her.  This was done on 05/26/2024.  The pathology report ([id number]) showed limited bone marrow.  I am not sure as to why the specimen was minimal.  However, nothing was really noted in the bone marrow.  There is no evidence of acute leukemia.  Flow cytometry is negative for any type of monoclonal population of cells.  I am little bit disappointed that the bone marrow did not help us  out.  We are going to clearly have to get a myeloid NGS panel on her to see if there is anything there that might show what was going on.  Ultimately, we may need to have another bone marrow biopsy done.  Think that our goal here is to try to get her  hemoglobin back up.  Hopefully we can do this with Procrit /Retacrit.  I really would hate to have to use Vidaza on her.  I really would hate to do this since we do not have a clear diagnosis.  Another possibility might be Luspatercept .  I know that she has leukopenia and thrombocytopenia.  We could certainly consider using a colony stimulating factor.  She feels all right.  She does feel little bit tired.  She has no bleeding or bruising.  There is no change in bowel or bladder habits..  She has had no issues with eating.  She is not a vegetarian.  She has had no cough or shortness of breath.  There is been no leg swelling.  Overall, I would have to say that her performance status is probably ECOG 1.   Wt Readings from Last 3 Encounters:  06/10/24 183 lb (83 kg)  05/26/24 177 lb (80.3 kg)  05/04/24 177 lb (80.3 kg)     Medications:   Current Outpatient Medications:    acetaminophen  (TYLENOL ) 500 MG tablet, Take 2 tablets (1,000 mg total) by mouth 2 (two) times daily as needed for mild pain or moderate pain., Disp: 30 tablet, Rfl: 0   alendronate  (FOSAMAX ) 70 MG tablet, Take 1 tablet (70 mg total) by mouth every 7 (seven) days. Take with a full glass  of water on an empty stomach., Disp: 12 tablet, Rfl: 3   atorvastatin  (LIPITOR) 20 MG tablet, Take 1 tablet (20 mg total) by mouth at bedtime., Disp: 90 tablet, Rfl: 1   CALCIUM -VITAMIN D PO, Take by mouth., Disp: , Rfl:    carboxymethylcellulose (REFRESH PLUS) 0.5 % SOLN, Place 1 drop into both eyes daily as needed., Disp: , Rfl:    cyanocobalamin  1000 MCG tablet, Take 1,000 mcg by mouth 2 (two) times daily., Disp: , Rfl:    ferrous sulfate 325 (65 FE) MG EC tablet, Take 325 mg by mouth 3 (three) times daily with meals., Disp: , Rfl:    hydrochlorothiazide  (HYDRODIURIL ) 25 MG tablet, Take 1 tablet (25 mg total) by mouth daily., Disp: 90 tablet, Rfl: 1   lisinopril  (ZESTRIL ) 20 MG tablet, Take 1 tablet (20 mg total) by mouth daily., Disp:  90 tablet, Rfl: 1   loratadine  (CLARITIN ) 10 MG tablet, Take 1 tablet (10 mg total) by mouth daily., Disp: 90 tablet, Rfl: 3   Multiple Vitamin (MULTIVITAMIN) capsule, Take 1 capsule by mouth daily., Disp: , Rfl:    Multiple Vitamins-Minerals (PRESERVISION AREDS PO), Take by mouth., Disp: , Rfl:    verapamil  (CALAN -SR) 240 MG CR tablet, Take 1 tablet (240 mg total) by mouth at bedtime., Disp: 90 tablet, Rfl: 1   lidocaine  (XYLOCAINE ) 2 % solution, Use as directed 15 mLs in the mouth or throat every 4 (four) hours as needed for mouth pain. (Patient not taking: Reported on 06/10/2024), Disp: 100 mL, Rfl: 0  Allergies: No Known Allergies  Past Medical History, Surgical history, Social history, and Family History were reviewed and updated.  Review of Systems: Review of Systems  Constitutional: Negative.   HENT:  Negative.    Eyes: Negative.   Respiratory: Negative.    Cardiovascular: Negative.   Gastrointestinal: Negative.   Endocrine: Negative.   Genitourinary: Negative.    Musculoskeletal: Negative.   Skin: Negative.   Neurological: Negative.   Hematological: Negative.   Psychiatric/Behavioral: Negative.     Physical Exam:  height is 5' 6 (1.676 m) and weight is 183 lb (83 kg). Her oral temperature is 97.6 F (36.4 C). Her blood pressure is 137/42 (abnormal) and her pulse is 77. Her respiration is 20 and oxygen saturation is 99%.   Physical Exam Vitals reviewed.  HENT:     Head: Normocephalic and atraumatic.  Eyes:     Pupils: Pupils are equal, round, and reactive to light.  Cardiovascular:     Rate and Rhythm: Normal rate and regular rhythm.     Heart sounds: Normal heart sounds.  Pulmonary:     Effort: Pulmonary effort is normal.     Breath sounds: Normal breath sounds.  Abdominal:     General: Bowel sounds are normal.     Palpations: Abdomen is soft.  Musculoskeletal:        General: No tenderness or deformity. Normal range of motion.     Cervical back: Normal range  of motion.  Lymphadenopathy:     Cervical: No cervical adenopathy.  Skin:    General: Skin is warm and dry.     Findings: No erythema or rash.  Neurological:     Mental Status: She is alert and oriented to person, place, and time.  Psychiatric:        Behavior: Behavior normal.        Thought Content: Thought content normal.        Judgment: Judgment normal.  Lab Results  Component Value Date   WBC 2.4 (L) 05/26/2024   HGB 6.1 (LL) 05/26/2024   HCT 19.3 (L) 05/26/2024   MCV 110.9 (H) 05/26/2024   PLT 36 (L) 05/26/2024     Chemistry      Component Value Date/Time   NA 142 05/04/2024 1115   K 3.6 05/04/2024 1115   CL 105 05/04/2024 1115   CO2 24 05/04/2024 1115   BUN 22 05/04/2024 1115   CREATININE 1.37 (H) 05/04/2024 1115      Component Value Date/Time   CALCIUM  8.3 (L) 05/04/2024 1115   ALKPHOS 59 05/04/2024 1115   AST 20 05/04/2024 1115   ALT 17 05/04/2024 1115   BILITOT 0.8 05/04/2024 1115        Assessment and Plan-   Ms. Flitton is a very nice 88 year old white female.  She has pancytopenia.  We have a look at her blood smear.  I really was not that impressed with that.  Again she had thrombocytopenia, leukopenia and obviously anemia.  The MCV is on the high side.  Again, she has a very low reticulocyte count.  I think the easiest thing to do would be to try her on ESA.  I think she would need weekly ESA to try to get her hemoglobin up.  Again, we may have to think about another bone marrow biopsy for get a better specimen.  I suppose she may have an aplastic type of marrow.  We will see what the Myeloid NGS panel shows.  This may certainly give us  an idea as to what could be going on.  I had a long talk with she and her husband.  They are both incredibly nice.  Again I want her to have a better quality of life.  We will try to get her back in next week to start the Procrit .  I would like to see her back probably in about a month.     Maude JONELLE Crease, MD 06/10/2024

## 2024-06-15 ENCOUNTER — Inpatient Hospital Stay

## 2024-06-15 ENCOUNTER — Inpatient Hospital Stay: Attending: Medical Oncology

## 2024-06-15 VITALS — BP 134/41 | HR 88 | Temp 97.9°F | Resp 18

## 2024-06-15 DIAGNOSIS — D61818 Other pancytopenia: Secondary | ICD-10-CM

## 2024-06-15 DIAGNOSIS — Z7983 Long term (current) use of bisphosphonates: Secondary | ICD-10-CM | POA: Insufficient documentation

## 2024-06-15 DIAGNOSIS — R531 Weakness: Secondary | ICD-10-CM | POA: Diagnosis not present

## 2024-06-15 DIAGNOSIS — Z79899 Other long term (current) drug therapy: Secondary | ICD-10-CM | POA: Insufficient documentation

## 2024-06-15 DIAGNOSIS — D631 Anemia in chronic kidney disease: Secondary | ICD-10-CM

## 2024-06-15 DIAGNOSIS — R5383 Other fatigue: Secondary | ICD-10-CM | POA: Insufficient documentation

## 2024-06-15 DIAGNOSIS — D696 Thrombocytopenia, unspecified: Secondary | ICD-10-CM | POA: Insufficient documentation

## 2024-06-15 LAB — CBC WITH DIFFERENTIAL (CANCER CENTER ONLY)
Abs Immature Granulocytes: 0.01 K/uL (ref 0.00–0.07)
Basophils Absolute: 0 K/uL (ref 0.0–0.1)
Basophils Relative: 0 %
Eosinophils Absolute: 0.1 K/uL (ref 0.0–0.5)
Eosinophils Relative: 2 %
HCT: 24.5 % — ABNORMAL LOW (ref 36.0–46.0)
Hemoglobin: 8.2 g/dL — ABNORMAL LOW (ref 12.0–15.0)
Immature Granulocytes: 0 %
Lymphocytes Relative: 43 %
Lymphs Abs: 1.1 K/uL (ref 0.7–4.0)
MCH: 34.2 pg — ABNORMAL HIGH (ref 26.0–34.0)
MCHC: 33.5 g/dL (ref 30.0–36.0)
MCV: 102.1 fL — ABNORMAL HIGH (ref 80.0–100.0)
Monocytes Absolute: 0.2 K/uL (ref 0.1–1.0)
Monocytes Relative: 8 %
Neutro Abs: 1.2 K/uL — ABNORMAL LOW (ref 1.7–7.7)
Neutrophils Relative %: 47 %
Platelet Count: 40 K/uL — ABNORMAL LOW (ref 150–400)
RBC: 2.4 MIL/uL — ABNORMAL LOW (ref 3.87–5.11)
RDW: 21.2 % — ABNORMAL HIGH (ref 11.5–15.5)
WBC Count: 2.5 K/uL — ABNORMAL LOW (ref 4.0–10.5)
nRBC: 0.8 % — ABNORMAL HIGH (ref 0.0–0.2)

## 2024-06-15 LAB — RETICULOCYTES
Immature Retic Fract: 19.9 % — ABNORMAL HIGH (ref 2.3–15.9)
RBC.: 2.39 MIL/uL — ABNORMAL LOW (ref 3.87–5.11)
Retic Count, Absolute: 46.4 K/uL (ref 19.0–186.0)
Retic Ct Pct: 1.9 % (ref 0.4–3.1)

## 2024-06-15 MED ORDER — EPOETIN ALFA-EPBX 40000 UNIT/ML IJ SOLN
40000.0000 [IU] | INTRAMUSCULAR | Status: DC
  Administered 2024-06-15: 40000 [IU] via SUBCUTANEOUS
  Filled 2024-06-15: qty 1

## 2024-06-15 NOTE — Patient Instructions (Signed)

## 2024-06-23 ENCOUNTER — Inpatient Hospital Stay: Admitting: Hematology & Oncology

## 2024-06-23 ENCOUNTER — Inpatient Hospital Stay

## 2024-06-23 ENCOUNTER — Other Ambulatory Visit: Payer: Self-pay | Admitting: *Deleted

## 2024-06-23 VITALS — BP 148/45 | HR 62 | Temp 97.7°F | Resp 18

## 2024-06-23 DIAGNOSIS — D61818 Other pancytopenia: Secondary | ICD-10-CM

## 2024-06-23 DIAGNOSIS — D649 Anemia, unspecified: Secondary | ICD-10-CM

## 2024-06-23 DIAGNOSIS — D631 Anemia in chronic kidney disease: Secondary | ICD-10-CM

## 2024-06-23 DIAGNOSIS — D696 Thrombocytopenia, unspecified: Secondary | ICD-10-CM | POA: Diagnosis not present

## 2024-06-23 LAB — CBC WITH DIFFERENTIAL (CANCER CENTER ONLY)
Abs Immature Granulocytes: 0.01 K/uL (ref 0.00–0.07)
Basophils Absolute: 0 K/uL (ref 0.0–0.1)
Basophils Relative: 0 %
Eosinophils Absolute: 0.1 K/uL (ref 0.0–0.5)
Eosinophils Relative: 3 %
HCT: 22.2 % — ABNORMAL LOW (ref 36.0–46.0)
Hemoglobin: 7.4 g/dL — ABNORMAL LOW (ref 12.0–15.0)
Immature Granulocytes: 1 %
Lymphocytes Relative: 41 %
Lymphs Abs: 0.9 K/uL (ref 0.7–4.0)
MCH: 34.7 pg — ABNORMAL HIGH (ref 26.0–34.0)
MCHC: 33.3 g/dL (ref 30.0–36.0)
MCV: 104.2 fL — ABNORMAL HIGH (ref 80.0–100.0)
Monocytes Absolute: 0.2 K/uL (ref 0.1–1.0)
Monocytes Relative: 9 %
Neutro Abs: 1 K/uL — ABNORMAL LOW (ref 1.7–7.7)
Neutrophils Relative %: 46 %
Platelet Count: 35 K/uL — ABNORMAL LOW (ref 150–400)
RBC: 2.13 MIL/uL — ABNORMAL LOW (ref 3.87–5.11)
RDW: 22.8 % — ABNORMAL HIGH (ref 11.5–15.5)
WBC Count: 2.2 K/uL — ABNORMAL LOW (ref 4.0–10.5)
nRBC: 2.3 % — ABNORMAL HIGH (ref 0.0–0.2)

## 2024-06-23 LAB — PREPARE RBC (CROSSMATCH)

## 2024-06-23 LAB — RETICULOCYTES
Immature Retic Fract: 28.1 % — ABNORMAL HIGH (ref 2.3–15.9)
Immature Retic Fract: 26.2 % — ABNORMAL HIGH (ref 2.3–15.9)
RBC.: 2.14 MIL/uL — ABNORMAL LOW (ref 3.87–5.11)
RBC.: 2.16 MIL/uL — ABNORMAL LOW (ref 3.87–5.11)
Retic Count, Absolute: 57.6 K/uL (ref 19.0–186.0)
Retic Count, Absolute: 60 K/uL (ref 19.0–186.0)
Retic Ct Pct: 2.7 % (ref 0.4–3.1)
Retic Ct Pct: 2.8 % (ref 0.4–3.1)

## 2024-06-23 MED ORDER — EPOETIN ALFA-EPBX 40000 UNIT/ML IJ SOLN
40000.0000 [IU] | INTRAMUSCULAR | Status: DC
  Administered 2024-06-23: 40000 [IU] via SUBCUTANEOUS
  Filled 2024-06-23: qty 1

## 2024-06-23 NOTE — Progress Notes (Signed)
 This Hematology and Oncology Follow Up Visit  Doris Mcdaniel [id number] 02-23-1936 88 y.o. 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  Unfortunately, we are not making much headway with respect to Doris pancytopenia.  It really does not help that the bone marrow that was initially done was a limited quality.  I think we will going to have to get another bone marrow biopsy on Doris and maybe try to get hopefully more specimen.  It is possible that she just may have an aplastic bone marrow that might be causing the issues with not being able to make a diagnosis.  She just feels tired all the time.  She just does not have a lot of energy.  She has no problems with fever.  There is no rashes.  She has had no bleeding.  She has had no change in bowel or bladder habits.  She has had no mouth sores.  There has been no headache.  Again, I think we will going to have to make some changes.  We will  have to see about another bone marrow test on Doris.  Maybe, this will give us  a better idea as to what might be going on.  I had a long talk with Doris she and Doris Mcdaniel.  I told him when I was recommending.  They are in agreement.  I would thought that the Retacrit would be helping.  I know she has only had a couple doses.  Maybe, it just may take a little bit longer.  I know that she does have a high iron saturation.  I am trying to send off NGS studies on Doris.  I sent some off about 2 weeks ago.  I still do not have these results back yet.  At the present time, Doris performance status is probably ECOG 2.   Wt Readings from Last 3 Encounters:  06/10/24 183 lb (83 kg)  05/26/24 177 lb (80.3 kg)  05/04/24 177 lb (80.3 kg)     Medications:   Current Outpatient Medications:    acetaminophen  (TYLENOL ) 500 MG tablet, Take 2  tablets (1,000 mg total) by mouth 2 (two) times daily as needed for mild pain or moderate pain., Disp: 30 tablet, Rfl: 0   alendronate  (FOSAMAX ) 70 MG tablet, Take 1 tablet (70 mg total) by mouth every 7 (seven) days. Take with a full glass of water on an empty stomach., Disp: 12 tablet, Rfl: 3   atorvastatin  (LIPITOR) 20 MG tablet, Take 1 tablet (20 mg total) by mouth at bedtime., Disp: 90 tablet, Rfl: 1   CALCIUM -VITAMIN D PO, Take by mouth., Disp: , Rfl:    carboxymethylcellulose (REFRESH PLUS) 0.5 % SOLN, Place 1 drop into both eyes daily as needed., Disp: , Rfl:    cyanocobalamin  1000 MCG tablet, Take 1,000 mcg by mouth 2 (two) times daily., Disp: , Rfl:    ferrous sulfate 325 (65 FE) MG EC tablet, Take 325 mg by mouth 3 (three) times daily with meals., Disp: , Rfl:    hydrochlorothiazide  (HYDRODIURIL ) 25 MG tablet, Take 1 tablet (25 mg total) by mouth daily., Disp: 90 tablet, Rfl: 1   lidocaine  (XYLOCAINE ) 2 % solution, Use as directed 15 mLs in the mouth or throat every 4 (four) hours as needed for mouth pain. (Patient not taking: Reported on  06/10/2024), Disp: 100 mL, Rfl: 0   lisinopril  (ZESTRIL ) 20 MG tablet, Take 1 tablet (20 mg total) by mouth daily., Disp: 90 tablet, Rfl: 1   loratadine  (CLARITIN ) 10 MG tablet, Take 1 tablet (10 mg total) by mouth daily., Disp: 90 tablet, Rfl: 3   Multiple Vitamin (MULTIVITAMIN) capsule, Take 1 capsule by mouth daily., Disp: , Rfl:    Multiple Vitamins-Minerals (PRESERVISION AREDS PO), Take by mouth., Disp: , Rfl:    verapamil  (CALAN -SR) 240 MG CR tablet, Take 1 tablet (240 mg total) by mouth at bedtime., Disp: 90 tablet, Rfl: 1 No current facility-administered medications for this visit.  Facility-Administered Medications Ordered in Other Visits:    epoetin alfa-epbx (RETACRIT) injection 40,000 Units, 40,000 Units, Subcutaneous, Weekly, Jemario Poitras R, MD, 40,000 Units at 06/23/24 1514  Allergies: No Known Allergies  Past Medical History,  Surgical history, Social history, and Family History were reviewed and updated.  Review of Systems: Review of Systems  Constitutional: Negative.   HENT:  Negative.    Eyes: Negative.   Respiratory: Negative.    Cardiovascular: Negative.   Gastrointestinal: Negative.   Endocrine: Negative.   Genitourinary: Negative.    Musculoskeletal: Negative.   Skin: Negative.   Neurological: Negative.   Hematological: Negative.   Psychiatric/Behavioral: Negative.     Physical Exam:  Vital signs show temperature 97.6.  Pulse 77.  Blood pressure 137/42.  Weight is 183 pounds.  Physical Exam Vitals reviewed.  HENT:     Head: Normocephalic and atraumatic.  Eyes:     Pupils: Pupils are equal, round, and reactive to light.  Cardiovascular:     Rate and Rhythm: Normal rate and regular rhythm.     Heart sounds: Normal heart sounds.  Pulmonary:     Effort: Pulmonary effort is normal.     Breath sounds: Normal breath sounds.  Abdominal:     General: Bowel sounds are normal.     Palpations: Abdomen is soft.  Musculoskeletal:        General: No tenderness or deformity. Normal range of motion.     Cervical back: Normal range of motion.  Lymphadenopathy:     Cervical: No cervical adenopathy.  Skin:    General: Skin is warm and dry.     Findings: No erythema or rash.  Neurological:     Mental Status: She is alert and oriented to person, place, and time.  Psychiatric:        Behavior: Behavior normal.        Thought Content: Thought content normal.        Judgment: Judgment normal.     Lab Results  Component Value Date   WBC 2.2 (L) 06/23/2024   HGB 7.4 (L) 06/23/2024   HCT 22.2 (L) 06/23/2024   MCV 104.2 (H) 06/23/2024   PLT 35 (L) 06/23/2024     Chemistry      Component Value Date/Time   NA 140 06/10/2024 1342   K 4.0 06/10/2024 1342   CL 105 06/10/2024 1342   CO2 23 06/10/2024 1342   BUN 25 (H) 06/10/2024 1342   CREATININE 1.35 (H) 06/10/2024 1342      Component Value  Date/Time   CALCIUM  8.3 (L) 06/10/2024 1342   ALKPHOS 71 06/10/2024 1342   AST 19 06/10/2024 1342   ALT 16 06/10/2024 1342   BILITOT 0.5 06/10/2024 1342        Assessment and Plan-   Doris Mcdaniel is a very nice 88 year old white female.  She  has pancytopenia.  We have a look at Doris blood smear.  I really was not that impressed with that.  Again she had thrombocytopenia, leukopenia and obviously anemia.  The MCV is on the high side.  Again, she has a very low reticulocyte count.  Again, we will have to see about another bone marrow biopsy on Doris.  Again, she may have an aplastic bone marrow.  Surprisingly, the cytogenetics that were sent off were normal.  Again, I am not sure how much specimen they really were able to get.  We will set up another bone marrow biopsy for Doris.  I just hate that we have to transfuse Doris again.  However, I just do not see any way around this.  We will try to get the bone marrow biopsy in another week.  Once we have another bone marrow result back, then maybe we might be able to make some more recommendations.   Maude JONELLE Crease, MD 06/10/2024

## 2024-06-23 NOTE — Patient Instructions (Signed)

## 2024-06-25 ENCOUNTER — Inpatient Hospital Stay

## 2024-06-25 VITALS — BP 131/55 | HR 63 | Temp 97.8°F | Resp 18

## 2024-06-25 DIAGNOSIS — D61818 Other pancytopenia: Secondary | ICD-10-CM

## 2024-06-25 DIAGNOSIS — D696 Thrombocytopenia, unspecified: Secondary | ICD-10-CM | POA: Diagnosis not present

## 2024-06-25 DIAGNOSIS — D649 Anemia, unspecified: Secondary | ICD-10-CM

## 2024-06-25 LAB — MISC LABCORP TEST (SEND OUT): Labcorp test code: 452312

## 2024-06-25 MED ORDER — SODIUM CHLORIDE 0.9 % IV SOLN: INTRAVENOUS | Status: DC

## 2024-06-25 MED ORDER — ACETAMINOPHEN 325 MG PO TABS
650.0000 mg | ORAL_TABLET | Freq: Once | ORAL | Status: AC
  Administered 2024-06-25: 650 mg via ORAL
  Filled 2024-06-25: qty 2

## 2024-06-25 MED ORDER — DIPHENHYDRAMINE HCL 25 MG PO CAPS
25.0000 mg | ORAL_CAPSULE | Freq: Once | ORAL | Status: AC
  Administered 2024-06-25: 25 mg via ORAL
  Filled 2024-06-25: qty 1

## 2024-06-25 NOTE — Patient Instructions (Signed)

## 2024-06-26 LAB — BPAM RBC
Blood Product Expiration Date: 202512172359
Blood Product Unit Number: 202512172359
ISSUE DATE / TIME: 202511130750
PRODUCT CODE: 202511130750
PRODUCT CODE: 202512172359
Unit Type and Rh: 9500
Unit Type and Rh: 202512172359
Unit Type and Rh: 9500
Unit Type and Rh: 9500

## 2024-06-26 LAB — TYPE AND SCREEN
ABO/RH(D): O NEG
Antibody Screen: NEGATIVE
Unit division: 0
Unit division: 0

## 2024-06-30 ENCOUNTER — Inpatient Hospital Stay

## 2024-06-30 DIAGNOSIS — D61818 Other pancytopenia: Secondary | ICD-10-CM

## 2024-06-30 DIAGNOSIS — D696 Thrombocytopenia, unspecified: Secondary | ICD-10-CM | POA: Diagnosis not present

## 2024-06-30 DIAGNOSIS — D631 Anemia in chronic kidney disease: Secondary | ICD-10-CM

## 2024-06-30 LAB — CBC WITH DIFFERENTIAL (CANCER CENTER ONLY)
Abs Immature Granulocytes: 0 K/uL (ref 0.00–0.07)
Basophils Absolute: 0 K/uL (ref 0.0–0.1)
Basophils Relative: 0 %
Eosinophils Absolute: 0 K/uL (ref 0.0–0.5)
Eosinophils Relative: 2 %
HCT: 30.6 % — ABNORMAL LOW (ref 36.0–46.0)
Hemoglobin: 10.5 g/dL — ABNORMAL LOW (ref 12.0–15.0)
Immature Granulocytes: 0 %
Lymphocytes Relative: 55 %
Lymphs Abs: 1.2 K/uL (ref 0.7–4.0)
MCH: 33.8 pg (ref 26.0–34.0)
MCHC: 34.3 g/dL (ref 30.0–36.0)
MCV: 98.4 fL (ref 80.0–100.0)
Monocytes Absolute: 0.2 K/uL (ref 0.1–1.0)
Monocytes Relative: 8 %
Neutro Abs: 0.8 K/uL — ABNORMAL LOW (ref 1.7–7.7)
Neutrophils Relative %: 35 %
Platelet Count: 27 K/uL — ABNORMAL LOW (ref 150–400)
RBC: 3.11 MIL/uL — ABNORMAL LOW (ref 3.87–5.11)
RDW: 20.1 % — ABNORMAL HIGH (ref 11.5–15.5)
WBC Count: 2.3 K/uL — ABNORMAL LOW (ref 4.0–10.5)
nRBC: 0 % (ref 0.0–0.2)

## 2024-06-30 LAB — RETICULOCYTES
Immature Retic Fract: 17.6 % — ABNORMAL HIGH (ref 2.3–15.9)
RBC.: 3.09 MIL/uL — ABNORMAL LOW (ref 3.87–5.11)
Retic Count, Absolute: 59.3 K/uL (ref 19.0–186.0)
Retic Ct Pct: 1.9 % (ref 0.4–3.1)

## 2024-06-30 MED ORDER — EPOETIN ALFA-EPBX 40000 UNIT/ML IJ SOLN
40000.0000 [IU] | INTRAMUSCULAR | Status: DC
  Administered 2024-06-30: 40000 [IU] via SUBCUTANEOUS
  Filled 2024-06-30: qty 1

## 2024-06-30 NOTE — Progress Notes (Signed)
 Cbc reviewed by Lauraine, NP. Ok to give injection Hgb less than 11.

## 2024-06-30 NOTE — Patient Instructions (Signed)

## 2024-07-02 LAB — MYELOID NGS

## 2024-07-07 ENCOUNTER — Inpatient Hospital Stay: Admitting: Hematology & Oncology

## 2024-07-07 ENCOUNTER — Encounter: Payer: Self-pay | Admitting: Hematology & Oncology

## 2024-07-07 ENCOUNTER — Inpatient Hospital Stay

## 2024-07-07 ENCOUNTER — Other Ambulatory Visit: Payer: Self-pay | Admitting: *Deleted

## 2024-07-07 VITALS — BP 132/45 | HR 69 | Temp 98.2°F | Resp 19 | Ht 66.0 in | Wt 184.1 lb

## 2024-07-07 DIAGNOSIS — D631 Anemia in chronic kidney disease: Secondary | ICD-10-CM

## 2024-07-07 DIAGNOSIS — D61818 Other pancytopenia: Secondary | ICD-10-CM

## 2024-07-07 DIAGNOSIS — D649 Anemia, unspecified: Secondary | ICD-10-CM

## 2024-07-07 DIAGNOSIS — D696 Thrombocytopenia, unspecified: Secondary | ICD-10-CM | POA: Diagnosis not present

## 2024-07-07 LAB — CBC WITH DIFFERENTIAL (CANCER CENTER ONLY)
Abs Immature Granulocytes: 0.01 K/uL (ref 0.00–0.07)
Basophils Absolute: 0 K/uL (ref 0.0–0.1)
Basophils Relative: 1 %
Eosinophils Absolute: 0.1 K/uL (ref 0.0–0.5)
Eosinophils Relative: 3 %
HCT: 27.7 % — ABNORMAL LOW (ref 36.0–46.0)
Hemoglobin: 9.4 g/dL — ABNORMAL LOW (ref 12.0–15.0)
Immature Granulocytes: 1 %
Lymphocytes Relative: 47 %
Lymphs Abs: 1 K/uL (ref 0.7–4.0)
MCH: 34.4 pg — ABNORMAL HIGH (ref 26.0–34.0)
MCHC: 33.9 g/dL (ref 30.0–36.0)
MCV: 101.5 fL — ABNORMAL HIGH (ref 80.0–100.0)
Monocytes Absolute: 0.2 K/uL (ref 0.1–1.0)
Monocytes Relative: 8 %
Neutro Abs: 0.8 K/uL — ABNORMAL LOW (ref 1.7–7.7)
Neutrophils Relative %: 40 %
Platelet Count: 28 K/uL — ABNORMAL LOW (ref 150–400)
RBC: 2.73 MIL/uL — ABNORMAL LOW (ref 3.87–5.11)
RDW: 21 % — ABNORMAL HIGH (ref 11.5–15.5)
Smear Review: NORMAL
WBC Count: 2 K/uL — ABNORMAL LOW (ref 4.0–10.5)
nRBC: 1 % — ABNORMAL HIGH (ref 0.0–0.2)

## 2024-07-07 LAB — RETICULOCYTES
Immature Retic Fract: 18.2 % — ABNORMAL HIGH (ref 2.3–15.9)
RBC.: 2.77 MIL/uL — ABNORMAL LOW (ref 3.87–5.11)
Retic Count, Absolute: 66.8 K/uL (ref 19.0–186.0)
Retic Ct Pct: 2.4 % (ref 0.4–3.1)

## 2024-07-07 LAB — SAMPLE TO BLOOD BANK

## 2024-07-07 MED ORDER — EPOETIN ALFA-EPBX 40000 UNIT/ML IJ SOLN
40000.0000 [IU] | Freq: Once | INTRAMUSCULAR | Status: AC
  Administered 2024-07-07: 40000 [IU] via SUBCUTANEOUS
  Filled 2024-07-07: qty 1

## 2024-07-07 NOTE — Progress Notes (Signed)
 This Hematology and Oncology Follow Up Visit  ANIE JUNIEL [id number] 01/28/1936 88 y.o. 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 Magno is back for follow-up.  Unfortunately, she is still not had her pulmonary biopsy.  This can be set up for December 8.  She does look a little better.  Her color does look a little bit better.  Her hemoglobin is 10.5 today which I am happy about.  Again, her bone marrow is just weak.  We did do a NGS panel on her.  This really was not that revealing.  On her last bone marrow, her cytogenetics were normal.  It is possible that we just may have an aplastic bone marrow.  This may be an aplastic myelodysplasia which is a little uncommon.  However, she seems to be managing.  I am happy that she is going to have a nice Thanksgiving.  She may or may not go to her daughter's house.  She has had no problems with bowels or bladder.  She has had no cough.  She has had no nausea or vomiting.  Overall, I would say that her performance status is probably ECOG 2.   Wt Readings from Last 3 Encounters:  07/07/24 184 lb 1.3 oz (83.5 kg)  06/10/24 183 lb (83 kg)  05/26/24 177 lb (80.3 kg)     Medications:   Current Outpatient Medications:    acetaminophen  (TYLENOL ) 500 MG tablet, Take 2 tablets (1,000 mg total) by mouth 2 (two) times daily as needed for mild pain or moderate pain., Disp: 30 tablet, Rfl: 0   alendronate  (FOSAMAX ) 70 MG tablet, Take 1 tablet (70 mg total) by mouth every 7 (seven) days. Take with a full glass of water on an empty stomach., Disp: 12 tablet, Rfl: 3   atorvastatin  (LIPITOR) 20 MG tablet, Take 1 tablet (20 mg total) by mouth at bedtime., Disp: 90 tablet, Rfl: 1   CALCIUM -VITAMIN D PO, Take by mouth., Disp: , Rfl:    carboxymethylcellulose (REFRESH PLUS) 0.5 % SOLN, Place 1 drop into both eyes  daily as needed., Disp: , Rfl:    cyanocobalamin  1000 MCG tablet, Take 1,000 mcg by mouth 2 (two) times daily., Disp: , Rfl:    ferrous sulfate 325 (65 FE) MG EC tablet, Take 325 mg by mouth 3 (three) times daily with meals., Disp: , Rfl:    hydrochlorothiazide  (HYDRODIURIL ) 25 MG tablet, Take 1 tablet (25 mg total) by mouth daily., Disp: 90 tablet, Rfl: 1   lidocaine  (XYLOCAINE ) 2 % solution, Use as directed 15 mLs in the mouth or throat every 4 (four) hours as needed for mouth pain., Disp: 100 mL, Rfl: 0   lisinopril  (ZESTRIL ) 20 MG tablet, Take 1 tablet (20 mg total) by mouth daily., Disp: 90 tablet, Rfl: 1   loratadine  (CLARITIN ) 10 MG tablet, Take 1 tablet (10 mg total) by mouth daily., Disp: 90 tablet, Rfl: 3   Multiple Vitamin (MULTIVITAMIN) capsule, Take 1 capsule by mouth daily., Disp: , Rfl:    Multiple Vitamins-Minerals (PRESERVISION AREDS PO), Take by mouth., Disp: , Rfl:    verapamil  (CALAN -SR) 240 MG CR tablet, Take 1 tablet (240 mg total) by mouth at bedtime., Disp: 90 tablet, Rfl: 1  Allergies: No Known Allergies  Past Medical History, Surgical history, Social history, and Family History were reviewed and updated.  Review of Systems: Review  of Systems  Constitutional: Negative.   HENT:  Negative.    Eyes: Negative.   Respiratory: Negative.    Cardiovascular: Negative.   Gastrointestinal: Negative.   Endocrine: Negative.   Genitourinary: Negative.    Musculoskeletal: Negative.   Skin: Negative.   Neurological: Negative.   Hematological: Negative.   Psychiatric/Behavioral: Negative.     Physical Exam:  Vital signs show temperature 98.2.  Pulse 69.  Blood pressure 132/45.  Weight is 184 pounds.   Physical Exam Vitals reviewed.  HENT:     Head: Normocephalic and atraumatic.  Eyes:     Pupils: Pupils are equal, round, and reactive to light.  Cardiovascular:     Rate and Rhythm: Normal rate and regular rhythm.     Heart sounds: Normal heart sounds.  Pulmonary:      Effort: Pulmonary effort is normal.     Breath sounds: Normal breath sounds.  Abdominal:     General: Bowel sounds are normal.     Palpations: Abdomen is soft.  Musculoskeletal:        General: No tenderness or deformity. Normal range of motion.     Cervical back: Normal range of motion.  Lymphadenopathy:     Cervical: No cervical adenopathy.  Skin:    General: Skin is warm and dry.     Findings: No erythema or rash.  Neurological:     Mental Status: She is alert and oriented to person, place, and time.  Psychiatric:        Behavior: Behavior normal.        Thought Content: Thought content normal.        Judgment: Judgment normal.     Lab Results  Component Value Date   WBC 2.0 (L) 07/07/2024   HGB 9.4 (L) 07/07/2024   HCT 27.7 (L) 07/07/2024   MCV 101.5 (H) 07/07/2024   PLT 28 (L) 07/07/2024     Chemistry      Component Value Date/Time   NA 140 06/10/2024 1342   K 4.0 06/10/2024 1342   CL 105 06/10/2024 1342   CO2 23 06/10/2024 1342   BUN 25 (H) 06/10/2024 1342   CREATININE 1.35 (H) 06/10/2024 1342      Component Value Date/Time   CALCIUM  8.3 (L) 06/10/2024 1342   ALKPHOS 71 06/10/2024 1342   AST 19 06/10/2024 1342   ALT 16 06/10/2024 1342   BILITOT 0.5 06/10/2024 1342        Assessment and Plan-   Ms. Catlin is a very nice 88 year old white female.  She has pancytopenia.  We have a look at her blood smear.  I really was not that impressed with that.  Again she had thrombocytopenia, leukopenia and obviously anemia.  The MCV is on the high side.  Again, she has a very low reticulocyte count.  Again, we will have to see about another bone marrow biopsy on her.  Again, she may have an aplastic bone marrow.  For right now, we will just continue on the weekly Retacrit .  I really wish I could use Aranesp but so far, we have not been able to.  We will see about the bone marrow biopsy on 07/20/2024.  I will plan to see her back myself in about a month.  By  then, we should have the results back from the new bone marrow biopsy.   Maude JONELLE Crease, MD 06/10/2024

## 2024-07-07 NOTE — Patient Instructions (Signed)

## 2024-07-13 ENCOUNTER — Other Ambulatory Visit: Payer: Self-pay | Admitting: Hematology & Oncology

## 2024-07-13 ENCOUNTER — Inpatient Hospital Stay: Attending: Medical Oncology

## 2024-07-13 ENCOUNTER — Other Ambulatory Visit: Payer: Self-pay | Admitting: Internal Medicine

## 2024-07-13 ENCOUNTER — Inpatient Hospital Stay

## 2024-07-13 VITALS — BP 145/52 | HR 76 | Temp 98.2°F | Resp 20

## 2024-07-13 DIAGNOSIS — Z79899 Other long term (current) drug therapy: Secondary | ICD-10-CM | POA: Insufficient documentation

## 2024-07-13 DIAGNOSIS — D61818 Other pancytopenia: Secondary | ICD-10-CM | POA: Insufficient documentation

## 2024-07-13 DIAGNOSIS — D631 Anemia in chronic kidney disease: Secondary | ICD-10-CM

## 2024-07-13 DIAGNOSIS — Z7983 Long term (current) use of bisphosphonates: Secondary | ICD-10-CM | POA: Insufficient documentation

## 2024-07-13 DIAGNOSIS — D696 Thrombocytopenia, unspecified: Secondary | ICD-10-CM | POA: Diagnosis present

## 2024-07-13 DIAGNOSIS — D46Z Other myelodysplastic syndromes: Secondary | ICD-10-CM | POA: Insufficient documentation

## 2024-07-13 LAB — CBC WITH DIFFERENTIAL (CANCER CENTER ONLY)
Abs Immature Granulocytes: 0 K/uL (ref 0.00–0.07)
Basophils Absolute: 0 K/uL (ref 0.0–0.1)
Basophils Relative: 0 %
Eosinophils Absolute: 0.1 K/uL (ref 0.0–0.5)
Eosinophils Relative: 2 %
HCT: 26.7 % — ABNORMAL LOW (ref 36.0–46.0)
Hemoglobin: 9.1 g/dL — ABNORMAL LOW (ref 12.0–15.0)
Immature Granulocytes: 0 %
Lymphocytes Relative: 49 %
Lymphs Abs: 1.1 K/uL (ref 0.7–4.0)
MCH: 34.9 pg — ABNORMAL HIGH (ref 26.0–34.0)
MCHC: 34.1 g/dL (ref 30.0–36.0)
MCV: 102.3 fL — ABNORMAL HIGH (ref 80.0–100.0)
Monocytes Absolute: 0.2 K/uL (ref 0.1–1.0)
Monocytes Relative: 7 %
Neutro Abs: 1 K/uL — ABNORMAL LOW (ref 1.7–7.7)
Neutrophils Relative %: 42 %
Platelet Count: 25 K/uL — ABNORMAL LOW (ref 150–400)
RBC: 2.61 MIL/uL — ABNORMAL LOW (ref 3.87–5.11)
RDW: 21.4 % — ABNORMAL HIGH (ref 11.5–15.5)
WBC Count: 2.3 K/uL — ABNORMAL LOW (ref 4.0–10.5)
nRBC: 0.9 % — ABNORMAL HIGH (ref 0.0–0.2)

## 2024-07-13 LAB — RETICULOCYTES
Immature Retic Fract: 21 % — ABNORMAL HIGH (ref 2.3–15.9)
RBC.: 2.63 MIL/uL — ABNORMAL LOW (ref 3.87–5.11)
Retic Count, Absolute: 57.9 K/uL (ref 19.0–186.0)
Retic Ct Pct: 2.2 % (ref 0.4–3.1)

## 2024-07-13 MED ORDER — EPOETIN ALFA-EPBX 40000 UNIT/ML IJ SOLN
40000.0000 [IU] | INTRAMUSCULAR | Status: DC
  Administered 2024-07-13: 40000 [IU] via SUBCUTANEOUS
  Filled 2024-07-13: qty 1

## 2024-07-13 NOTE — Patient Instructions (Signed)

## 2024-07-14 DIAGNOSIS — H353211 Exudative age-related macular degeneration, right eye, with active choroidal neovascularization: Secondary | ICD-10-CM | POA: Diagnosis not present

## 2024-07-17 ENCOUNTER — Other Ambulatory Visit: Payer: Self-pay

## 2024-07-17 DIAGNOSIS — Z01812 Encounter for preprocedural laboratory examination: Secondary | ICD-10-CM

## 2024-07-20 ENCOUNTER — Encounter (HOSPITAL_COMMUNITY): Payer: Self-pay

## 2024-07-20 ENCOUNTER — Ambulatory Visit (HOSPITAL_COMMUNITY)
Admission: RE | Admit: 2024-07-20 | Discharge: 2024-07-20 | Disposition: A | Source: Ambulatory Visit | Attending: Hematology & Oncology | Admitting: Hematology & Oncology

## 2024-07-20 ENCOUNTER — Ambulatory Visit (HOSPITAL_COMMUNITY)
Admission: RE | Admit: 2024-07-20 | Discharge: 2024-07-20 | Disposition: A | Source: Ambulatory Visit | Attending: Hematology & Oncology

## 2024-07-20 DIAGNOSIS — R5383 Other fatigue: Secondary | ICD-10-CM | POA: Diagnosis not present

## 2024-07-20 DIAGNOSIS — Z1379 Encounter for other screening for genetic and chromosomal anomalies: Secondary | ICD-10-CM | POA: Insufficient documentation

## 2024-07-20 DIAGNOSIS — D61818 Other pancytopenia: Secondary | ICD-10-CM

## 2024-07-20 DIAGNOSIS — Z87891 Personal history of nicotine dependence: Secondary | ICD-10-CM | POA: Diagnosis not present

## 2024-07-20 DIAGNOSIS — Z923 Personal history of irradiation: Secondary | ICD-10-CM | POA: Diagnosis not present

## 2024-07-20 DIAGNOSIS — Z853 Personal history of malignant neoplasm of breast: Secondary | ICD-10-CM | POA: Diagnosis not present

## 2024-07-20 DIAGNOSIS — D72822 Plasmacytosis: Secondary | ICD-10-CM | POA: Diagnosis not present

## 2024-07-20 DIAGNOSIS — Z01812 Encounter for preprocedural laboratory examination: Secondary | ICD-10-CM

## 2024-07-20 DIAGNOSIS — D72820 Lymphocytosis (symptomatic): Secondary | ICD-10-CM | POA: Diagnosis not present

## 2024-07-20 DIAGNOSIS — R0609 Other forms of dyspnea: Secondary | ICD-10-CM | POA: Diagnosis not present

## 2024-07-20 HISTORY — DX: Dyspnea, unspecified: R06.00

## 2024-07-20 LAB — CBC WITH DIFFERENTIAL/PLATELET
Abs Immature Granulocytes: 0 K/uL (ref 0.00–0.07)
Basophils Absolute: 0 K/uL (ref 0.0–0.1)
Basophils Relative: 1 %
Eosinophils Absolute: 0 K/uL (ref 0.0–0.5)
Eosinophils Relative: 2 %
HCT: 24.7 % — ABNORMAL LOW (ref 36.0–46.0)
Hemoglobin: 8.2 g/dL — ABNORMAL LOW (ref 12.0–15.0)
Immature Granulocytes: 0 %
Lymphocytes Relative: 37 %
Lymphs Abs: 0.6 K/uL — ABNORMAL LOW (ref 0.7–4.0)
MCH: 35.3 pg — ABNORMAL HIGH (ref 26.0–34.0)
MCHC: 33.2 g/dL (ref 30.0–36.0)
MCV: 106.5 fL — ABNORMAL HIGH (ref 80.0–100.0)
Monocytes Absolute: 0.1 K/uL (ref 0.1–1.0)
Monocytes Relative: 8 %
Neutro Abs: 0.9 K/uL — ABNORMAL LOW (ref 1.7–7.7)
Neutrophils Relative %: 52 %
Platelets: 22 K/uL — CL (ref 150–400)
RBC: 2.32 MIL/uL — ABNORMAL LOW (ref 3.87–5.11)
RDW: 22.3 % — ABNORMAL HIGH (ref 11.5–15.5)
Smear Review: NORMAL
WBC: 1.7 K/uL — ABNORMAL LOW (ref 4.0–10.5)
nRBC: 0 % (ref 0.0–0.2)

## 2024-07-20 LAB — PROTIME-INR
INR: 1 (ref 0.8–1.2)
Prothrombin Time: 13.6 s (ref 11.4–15.2)

## 2024-07-20 MED ORDER — HYDROCODONE-ACETAMINOPHEN 5-325 MG PO TABS: 1.0000 | ORAL_TABLET | ORAL | Status: DC | PRN

## 2024-07-20 MED ORDER — MIDAZOLAM HCL (PF) 2 MG/2ML IJ SOLN
INTRAMUSCULAR | Status: AC | PRN
  Administered 2024-07-20 (×2): .5 mg via INTRAVENOUS
  Administered 2024-07-20: 1 mg via INTRAVENOUS

## 2024-07-20 MED ORDER — SODIUM CHLORIDE 0.9 % IV SOLN: INTRAVENOUS | Status: DC

## 2024-07-20 MED ORDER — MIDAZOLAM HCL 2 MG/2ML IJ SOLN
INTRAMUSCULAR | Status: AC
  Filled 2024-07-20: qty 2

## 2024-07-20 NOTE — Discharge Instructions (Signed)
 Bone Marrow Aspiration and Bone Marrow Biopsy, Adult, Care After  The following information offers guidance on how to care for yourself after your procedure. Your health care provider may also give you more specific instructions. If you have problems or questions, contact your health care provider.  What can I expect after the procedure?  May remove dressing or bandaid and shower tomorrow.  Keep site clean and dry. Replace with clean dressing or bandaid as necessary. Urgent needs IR clinic [phone] (mon-fri 8-5).  After the procedure, it is common to have: Mild pain and tenderness. Swelling. Bruising. Follow these instructions at home: Incision care  Follow instructions from your health care provider about how to take care of the incision site. Make sure you: Wash your hands with soap and water for at least 20 seconds before and after you change your bandage (dressing). If soap and water are not available, use hand sanitizer. Change your dressing as told by your health care provider. Leave stitches (sutures), skin glue, or adhesive strips in place. These skin closures may need to stay in place for 2 weeks or longer. If adhesive strip edges start to loosen and curl up, you may trim the loose edges. Do not remove adhesive strips completely unless your health care provider tells you to do that. Check your incision site every day for signs of infection. Check for: More redness, swelling, or pain. Fluid or blood. Warmth. Pus or a bad smell. Activity Return to your normal activities as told by your health care provider. Ask your health care provider what activities are safe for you. Do not lift anything that is heavier than 10 lb (4.5 kg), or the limit that you are told, until your health care provider says that it is safe. If you were given a sedative during the procedure, it can affect you for several hours. Do not drive or operate machinery until your health care provider says that it is  safe. General instructions  Take over-the-counter and prescription medicines only as told by your health care provider. Do not take baths, swim, or use a hot tub until your health care provider approves. Ask your health care provider if you may take showers. You may only be allowed to take sponge baths. If directed, put ice on the affected area. To do this: Put ice in a plastic bag. Place a towel between your skin and the bag. Leave the ice on for 20 minutes, 2-3 times a day. If your skin turns bright red, remove the ice right away to prevent skin damage. The risk of skin damage is higher if you cannot feel pain, heat, or cold. Contact a health care provider if: You have signs of infection. Your pain is not controlled with medicine. You have cancer, and a temperature of 100.22F (38C) or higher. Get help right away if: You have a temperature of 101F (38.3C) or higher, or as told by your health care provider. You have bleeding from the incision site that cannot be controlled. This information is not intended to replace advice given to you by your health care provider. Make sure you discuss any questions you have with your health care provider. Document Revised: 12/04/2021 Document Reviewed: 12/04/2021 Elsevier Patient Education  2023 Elsevier Inc.     Moderate Conscious Sedation, Adult, Care After  This sheet gives you information about how to care for yourself after your procedure. Your health care provider may also give you more specific instructions. If you have problems or questions, contact  your health care provider. What can I expect after the procedure? After the procedure, it is common to have: Sleepiness for several hours. Impaired judgment for several hours. Difficulty with balance. Vomiting if you eat too soon. Follow these instructions at home: For the time period you were told by your health care provider:     Rest. Do not participate in activities where you  could fall or become injured. Do not drive or use machinery. Do not drink alcohol. Do not take sleeping pills or medicines that cause drowsiness. Do not make important decisions or sign legal documents. Do not take care of children on your own. Eating and drinking  Follow the diet recommended by your health care provider. Drink enough fluid to keep your urine pale yellow. If you vomit: Drink water, juice, or soup when you can drink without vomiting. Make sure you have little or no nausea before eating solid foods. General instructions Take over-the-counter and prescription medicines only as told by your health care provider. Have a responsible adult stay with you for the time you are told. It is important to have someone help care for you until you are awake and alert. Do not smoke. Keep all follow-up visits as told by your health care provider. This is important. Contact a health care provider if: You are still sleepy or having trouble with balance after 24 hours. You feel light-headed. You keep feeling nauseous or you keep vomiting. You develop a rash. You have a fever. You have redness or swelling around the IV site. Get help right away if: You have trouble breathing. You have new-onset confusion at home. Summary After the procedure, it is common to feel sleepy, have impaired judgment, or feel nauseous if you eat too soon. Rest after you get home. Know the things you should not do after the procedure. Follow the diet recommended by your health care provider and drink enough fluid to keep your urine pale yellow. Get help right away if you have trouble breathing or new-onset confusion at home. This information is not intended to replace advice given to you by your health care provider. Make sure you discuss any questions you have with your health care provider. Document Revised: 11/27/2019 Document Reviewed: 06/25/2019 Elsevier Patient Education  2023 [employer].

## 2024-07-20 NOTE — H&P (Signed)
 Chief Complaint: Pancytopenia  Referring Provider(s): Ennever,Peter R   Supervising Physician: Dr. Johann  Patient Status: [facility] - Out-pt  History of Present Illness: Doris Mcdaniel is a 88 y.o. female history of L breast cancer treated with XRT. She is currently being followed by heme/onc for pancytopenia which has required blood transfusions. She has undergone extensive blood work with largely normal mult myeloma, FISH, UPEP panels.  She previously went underwent bone marrow biopsy and IR on 05/26/2024 with Dr. Philip; the biopsy was not adequate for testing (possibly reflects aplastic bone marrow).  I requested to repeat bone marrow biopsy for which patient has been scheduled today.  Patient tells me that she did eat breakfast this morning.  Discussed single agent sedation as an option.  Patient is highly motivated to proceed today and consents to single agent sedation along with local numbing.  Does not wear CPAP or use supplemental home O2. She does have upper and lower dentures.   Denies fever, chills, CP, sore throat, N/V, abd pain, blood in urine, abnormal bruising, leg swelling, back pain. Endorses dyspnea with exertion, fatigue.  Allergies Reviewed:  Patient has no known allergies.   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 Authorizing Provider  alendronate  (FOSAMAX ) 70 MG tablet Take 1 tablet (70 mg total) by mouth every 7 (seven) days. Take with a full glass of water on an empty stomach. 08/27/23  Yes Paz, Aloysius BRAVO, MD  atorvastatin  (LIPITOR) 20 MG tablet Take 1 tablet (20 mg total) by mouth at bedtime. 01/24/24  Yes Amon Aloysius BRAVO, MD  CALCIUM -VITAMIN D PO Take by mouth.   Yes [provider]  carboxymethylcellulose (REFRESH PLUS) 0.5 % SOLN Place 1 drop into both eyes daily as needed.   Yes [provider]  cyanocobalamin  1000 MCG tablet Take 1,000 mcg by mouth 2 (two) times daily.   Yes [provider]  ferrous sulfate 325 (65 FE) MG EC tablet Take 325 mg by mouth 3 (three) times daily with meals.   Yes [provider]  hydrochlorothiazide  (HYDRODIURIL ) 25 MG tablet Take 1 tablet (25 mg total) by mouth daily. 01/24/24  Yes Paz, Aloysius BRAVO, MD  lidocaine  (XYLOCAINE ) 2 % solution Use as directed 15 mLs in the mouth or throat every 4 (four) hours as needed for mouth pain. 04/25/24  Yes Prosperi, Christian H, PA-C  lisinopril  (ZESTRIL ) 20 MG tablet Take 1 tablet (20 mg total) by mouth daily. 01/24/24  Yes Paz, Aloysius BRAVO, MD  loratadine  (CLARITIN ) 10 MG tablet Take 1 tablet (10  mg total) by mouth daily. 12/28/13  Yes Paz, Jose E, MD  Multiple Vitamin (MULTIVITAMIN) capsule Take 1 capsule by mouth daily.   Yes [provider]  Multiple Vitamins-Minerals (PRESERVISION AREDS PO) Take by mouth.   Yes [provider]  verapamil  (CALAN -SR) 240 MG CR tablet Take 1 tablet (240 mg total) by mouth at bedtime. 01/24/24  Yes Paz, Aloysius BRAVO, MD  acetaminophen  (TYLENOL ) 500 MG tablet Take 2 tablets (1,000 mg total) by mouth 2 (two) times daily as needed for mild pain or moderate pain. 10/10/21   Carla Milling, RPH-CPP     Family History  Problem Relation  Age of Onset   Heart disease Brother        CABG age ~ 53   Colon cancer Neg Hx    Breast cancer Neg Hx    Stroke Neg Hx    Hypertension Neg Hx    Diabetes Neg Hx     Social History   Socioeconomic History   Marital status: Married    Spouse name: Not on file   Number of children: 2   Years of education: Not on file   Highest education level: Not on file  Occupational History   Occupation: retired  Tobacco Use   Smoking status: Former    Current packs/day: 0.00    Types: Cigarettes    Quit date: 07/08/1973    Years since quitting: 51.0   Smokeless tobacco: Never   Tobacco comments:    used to smoke 1 PPD  Vaping Use   Vaping status: Never Used  Substance and Sexual Activity   Alcohol use: No   Drug use: No   Sexual activity: Not Currently  Other Topics Concern   Not on file  Social History Narrative   Lives w/ husband . 2 children live near by   Social Drivers of Health   Financial Resource Strain: Low Risk  (03/19/2024)   Overall Financial Resource Strain (CARDIA)    Difficulty of Paying Living Expenses: Not hard at all  Food Insecurity: No Food Insecurity (03/19/2024)   Hunger Vital Sign    Worried About Running Out of Food in the Last Year: Never true    Ran Out of Food in the Last Year: Never true  Transportation Needs: No Transportation Needs (03/19/2024)   PRAPARE - Administrator, Civil Service (Medical): No    Lack of Transportation (Non-Medical): No  Physical Activity: Insufficiently Active (03/19/2024)   Exercise Vital Sign    Days of Exercise per Week: 1 day    Minutes of Exercise per Session: 10 min  Stress: No Stress Concern Present (03/19/2024)   [employer] of Occupational Health - Occupational Stress Questionnaire    Feeling of Stress: Not at all  Social Connections: Moderately Isolated (03/19/2024)   Social Connection and Isolation Panel    Frequency of Communication with Friends and Family: Three times a week    Frequency of Social  Gatherings with Friends and Family: Never    Attends Religious Services: Never    Database Administrator or Organizations: No    Attends Engineer, Structural: Never    Marital Status: Married     Review of Systems: A 12 point ROS discussed and pertinent positives are indicated in the HPI above.  All other systems are negative.    Vital Signs: BP (!) 139/51   Pulse 66   Temp 98.4 F (36.9 C) (Oral)   Resp 12  SpO2 97%   Advance Care Plan: No documents on file    Physical Exam HENT:     Head:     Comments: Glasses, upper and lower dentures    Mouth/Throat:     Mouth: Mucous membranes are moist.     Pharynx: Oropharynx is clear.  Cardiovascular:     Rate and Rhythm: Normal rate and regular rhythm.     Pulses: Normal pulses.     Heart sounds: Normal heart sounds.  Pulmonary:     Effort: Pulmonary effort is normal.  Abdominal:     Palpations: Abdomen is soft.  Musculoskeletal:     Right lower leg: No edema.     Left lower leg: No edema.  Skin:    General: Skin is warm and dry.     Comments: No rash or lesion over planned puncture  Neurological:     Mental Status: She is alert and oriented to person, place, and time.  Psychiatric:        Mood and Affect: Mood normal.        Behavior: Behavior normal.        Thought Content: Thought content normal.     Imaging: No results found.   Labs:  CBC: Recent Labs    06/23/24 1300 06/30/24 1249 07/07/24 1158 07/13/24 1306  WBC 2.2* 2.3* 2.0* 2.3*  HGB 7.4* 10.5* 9.4* 9.1*  HCT 22.2* 30.6* 27.7* 26.7*  PLT 35* 27* 28* 25*    COAGS: No results for input(s): INR, APTT in the last 8760 hours.  BMP: Recent Labs    03/10/24 1032 04/25/24 0703 05/04/24 1115 06/10/24 1342  NA 143 138 142 140  K 4.0 3.5 3.6 4.0  CL 105 103 105 105  CO2 25 21* 24 23  GLUCOSE 123* 126* 171* 134*  BUN 24* 24* 22 25*  CALCIUM  8.8* 8.2* 8.3* 8.3*  CREATININE 1.62* 1.75* 1.37* 1.35*  GFRNONAA 31* 28* 38* 38*     LIVER FUNCTION TESTS: Recent Labs    01/28/24 1054 03/10/24 1032 05/04/24 1115 06/10/24 1342  BILITOT 0.6 0.5 0.8 0.5  AST 12* 18 20 19   ALT 16 12 17 16   ALKPHOS 61 70 59 71  PROT 6.6 6.7 6.4* 6.6  ALBUMIN 4.2 4.3 4.0 4.0    TUMOR MARKERS: No results for input(s): AFPTM, CEA, CA199, CHROMGRNA in the last 8760 hours.  Assessment and Plan:  Request for  image guided BMBx and asp approved with Dr. Johann on 07/20/2024. No contraindications for procedure identified in ROS, physical exam, or review of pre-sedation considerations. CBC w/ diff drawn prior to procedure.  VSS, afebrile Patient not asked to hold any AC/AP for this low bleeding risk procedure Abx not indicated    Risks and benefits of bone marrow biopsy and aspiration was discussed with the patient and/or patient's family including, but not limited to bleeding, infection, damage to adjacent structures or low yield requiring additional tests.  All of the questions were answered and there is agreement to proceed.  Consent signed and in chart.   Thank you for allowing our service to participate in Doris Mcdaniel 's care.    Electronically Signed: Laymon Coast, NP   07/20/2024, 10:08 AM     I spent a total of  15 Minutes   in face to face in clinical consultation, greater than 50% of which was counseling/coordinating care for imaged guided BMBx and asp.    (A copy of this note was sent to the  referring provider and the time of visit.)

## 2024-07-20 NOTE — Procedures (Signed)
  Procedure:  CT bone marrow biopsy R iliac Preprocedure diagnosis: Diagnoses of Pancytopenia, acquired (HCC) and Pre-procedural laboratory examination were pertinent to this visit. Postprocedure diagnosis: same EBL:    minimal Complications:   none immediate  See full dictation in [employer].  CHARM Toribio Faes MD Main # [phone] Pager  [phone] Mobile [phone]

## 2024-07-21 ENCOUNTER — Inpatient Hospital Stay

## 2024-07-21 VITALS — BP 156/68 | HR 74 | Temp 97.6°F | Resp 17

## 2024-07-21 DIAGNOSIS — D61818 Other pancytopenia: Secondary | ICD-10-CM

## 2024-07-21 DIAGNOSIS — D631 Anemia in chronic kidney disease: Secondary | ICD-10-CM

## 2024-07-21 DIAGNOSIS — D46Z Other myelodysplastic syndromes: Secondary | ICD-10-CM | POA: Diagnosis not present

## 2024-07-21 LAB — CBC WITH DIFFERENTIAL (CANCER CENTER ONLY)
Abs Immature Granulocytes: 0 K/uL (ref 0.00–0.07)
Basophils Absolute: 0 K/uL (ref 0.0–0.1)
Basophils Relative: 0 %
Eosinophils Absolute: 0 K/uL (ref 0.0–0.5)
Eosinophils Relative: 2 %
HCT: 23.4 % — ABNORMAL LOW (ref 36.0–46.0)
Hemoglobin: 7.9 g/dL — ABNORMAL LOW (ref 12.0–15.0)
Immature Granulocytes: 0 %
Lymphocytes Relative: 53 %
Lymphs Abs: 1 K/uL (ref 0.7–4.0)
MCH: 35.1 pg — ABNORMAL HIGH (ref 26.0–34.0)
MCHC: 33.8 g/dL (ref 30.0–36.0)
MCV: 104 fL — ABNORMAL HIGH (ref 80.0–100.0)
Monocytes Absolute: 0.1 K/uL (ref 0.1–1.0)
Monocytes Relative: 6 %
Neutro Abs: 0.8 K/uL — ABNORMAL LOW (ref 1.7–7.7)
Neutrophils Relative %: 39 %
Platelet Count: 23 K/uL — ABNORMAL LOW (ref 150–400)
RBC: 2.25 MIL/uL — ABNORMAL LOW (ref 3.87–5.11)
RDW: 22.3 % — ABNORMAL HIGH (ref 11.5–15.5)
Smear Review: NORMAL
WBC Count: 1.9 K/uL — ABNORMAL LOW (ref 4.0–10.5)
nRBC: 1 % — ABNORMAL HIGH (ref 0.0–0.2)

## 2024-07-21 LAB — RETICULOCYTES
Immature Retic Fract: 14.5 % (ref 2.3–15.9)
RBC.: 2.23 MIL/uL — ABNORMAL LOW (ref 3.87–5.11)
Retic Count, Absolute: 51.1 K/uL (ref 19.0–186.0)
Retic Ct Pct: 2.3 % (ref 0.4–3.1)

## 2024-07-21 MED ORDER — EPOETIN ALFA-EPBX 40000 UNIT/ML IJ SOLN
40000.0000 [IU] | INTRAMUSCULAR | Status: DC
  Administered 2024-07-21: 40000 [IU] via SUBCUTANEOUS
  Filled 2024-07-21: qty 1

## 2024-07-21 NOTE — Progress Notes (Signed)
 Hgb 7.9.  Dr Timmy made aware.  Patient states that she doesn't feel any differently, just a little weak but no different from other days.  Dr Timmy says injection only will recheck next week. PAtient had BM biopsy yesterday.  Will await results.

## 2024-07-21 NOTE — Patient Instructions (Signed)

## 2024-07-23 LAB — SURGICAL PATHOLOGY

## 2024-07-28 ENCOUNTER — Inpatient Hospital Stay

## 2024-07-28 VITALS — BP 140/49 | HR 84 | Temp 98.7°F | Resp 16

## 2024-07-28 DIAGNOSIS — D61818 Other pancytopenia: Secondary | ICD-10-CM

## 2024-07-28 DIAGNOSIS — D46Z Other myelodysplastic syndromes: Secondary | ICD-10-CM | POA: Diagnosis not present

## 2024-07-28 DIAGNOSIS — D631 Anemia in chronic kidney disease: Secondary | ICD-10-CM

## 2024-07-28 LAB — CBC WITH DIFFERENTIAL (CANCER CENTER ONLY)
Abs Immature Granulocytes: 0.01 K/uL (ref 0.00–0.07)
Basophils Absolute: 0 K/uL (ref 0.0–0.1)
Basophils Relative: 0 %
Eosinophils Absolute: 0 K/uL (ref 0.0–0.5)
Eosinophils Relative: 2 %
HCT: 22.5 % — ABNORMAL LOW (ref 36.0–46.0)
Hemoglobin: 7.7 g/dL — ABNORMAL LOW (ref 12.0–15.0)
Immature Granulocytes: 1 %
Lymphocytes Relative: 50 %
Lymphs Abs: 1.1 K/uL (ref 0.7–4.0)
MCH: 35.8 pg — ABNORMAL HIGH (ref 26.0–34.0)
MCHC: 34.2 g/dL (ref 30.0–36.0)
MCV: 104.7 fL — ABNORMAL HIGH (ref 80.0–100.0)
Monocytes Absolute: 0.2 K/uL (ref 0.1–1.0)
Monocytes Relative: 9 %
Neutro Abs: 0.8 K/uL — ABNORMAL LOW (ref 1.7–7.7)
Neutrophils Relative %: 38 %
Platelet Count: 25 K/uL — ABNORMAL LOW (ref 150–400)
RBC: 2.15 MIL/uL — ABNORMAL LOW (ref 3.87–5.11)
RDW: 22.5 % — ABNORMAL HIGH (ref 11.5–15.5)
WBC Count: 2.2 K/uL — ABNORMAL LOW (ref 4.0–10.5)
nRBC: 0.9 % — ABNORMAL HIGH (ref 0.0–0.2)

## 2024-07-28 MED ORDER — EPOETIN ALFA-EPBX 40000 UNIT/ML IJ SOLN
40000.0000 [IU] | INTRAMUSCULAR | Status: DC
  Administered 2024-07-28: 13:00:00 40000 [IU] via SUBCUTANEOUS
  Filled 2024-07-28: qty 1

## 2024-07-28 NOTE — Patient Instructions (Signed)

## 2024-07-29 ENCOUNTER — Encounter (HOSPITAL_COMMUNITY): Payer: Self-pay

## 2024-07-31 ENCOUNTER — Other Ambulatory Visit: Payer: Self-pay | Admitting: Hematology & Oncology

## 2024-08-03 ENCOUNTER — Other Ambulatory Visit: Payer: Self-pay

## 2024-08-03 ENCOUNTER — Ambulatory Visit (INDEPENDENT_AMBULATORY_CARE_PROVIDER_SITE_OTHER): Admitting: Internal Medicine

## 2024-08-03 ENCOUNTER — Encounter: Payer: Self-pay | Admitting: Internal Medicine

## 2024-08-03 VITALS — BP 152/70 | HR 88 | Temp 98.0°F | Resp 18 | Ht 66.0 in | Wt 187.1 lb

## 2024-08-03 DIAGNOSIS — Z23 Encounter for immunization: Secondary | ICD-10-CM

## 2024-08-03 DIAGNOSIS — R6889 Other general symptoms and signs: Secondary | ICD-10-CM

## 2024-08-03 DIAGNOSIS — D61818 Other pancytopenia: Secondary | ICD-10-CM

## 2024-08-03 DIAGNOSIS — D72829 Elevated white blood cell count, unspecified: Secondary | ICD-10-CM

## 2024-08-03 DIAGNOSIS — D649 Anemia, unspecified: Secondary | ICD-10-CM

## 2024-08-03 DIAGNOSIS — D696 Thrombocytopenia, unspecified: Secondary | ICD-10-CM

## 2024-08-03 DIAGNOSIS — I10 Essential (primary) hypertension: Secondary | ICD-10-CM | POA: Diagnosis not present

## 2024-08-03 DIAGNOSIS — R7689 Other specified abnormal immunological findings in serum: Secondary | ICD-10-CM

## 2024-08-03 DIAGNOSIS — D631 Anemia in chronic kidney disease: Secondary | ICD-10-CM

## 2024-08-03 DIAGNOSIS — D539 Nutritional anemia, unspecified: Secondary | ICD-10-CM

## 2024-08-03 NOTE — Progress Notes (Signed)
 "  Subjective:    Patient ID: Doris Mcdaniel, female    DOB: July 21, 1936, 88 y.o.   MRN: [id number]  DOS:  08/03/2024 Follow-up, here with her husband  Discussed the use of AI scribe software for clinical note transcription with the patient, who gave verbal consent to proceed.  History of Present Illness Doris Mcdaniel is an 88 year old female, multiple issues discussed  Fatigue and exercise intolerance - Marked fatigue and low energy requiring frequent rest during basic tasks such as preparing meals and walking short distances. - Exhaustion after walking from the parking lot to the clinic, necessitating a rest. - Leg weakness with ambulation, able to walk only about twenty feet before needing to stop.  Dyspnea and cardiovascular symptoms - Reports shortness of breath with exertion. - No chest pain. - No palpitations- No syncope.  Anemia management - Per hematology, had transfusions previously.  Cognitive changes: Patient has unconcerned about dementia. -Patient admits to forgetfulness, primarily for events from years ago. - Able to manage current tasks such as paying bills.  Hypertension - Good med compliance. - Recent blood pressure readings: 156/48, 140/60, and 144/60.  Gastrointestinal and constitutional symptoms - No nausea, vomiting, diarrhea, or blood in the stool.   BP Readings from Last 3 Encounters:  08/03/24 (!) 152/70  07/28/24 (!) 140/49  07/21/24 (!) 156/68    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, age indertiminate    Allergic rhinitis    Dyspnea    with activity   Erythropoietin  deficiency anemia 06/10/2024   GERD (gastroesophageal reflux disease)    History of breast cancer 2012   left breast   Hyperlipidemia    Hypertension    Pancytopenia, acquired (HCC) 06/10/2024   Personal history of radiation therapy     Past  Surgical History:  Procedure Laterality Date   ABDOMINAL HYSTERECTOMY     BREAST BIOPSY Left 01/24/2011   BREAST LUMPECTOMY Left 02/21/2011   BREAST SURGERY     lumpectomy (L)   CATARACT EXTRACTION     B   CHOLECYSTECTOMY  03/2008   OOPHORECTOMY     they left a part of the ovarly    Current Outpatient Medications  Medication Instructions   acetaminophen  (TYLENOL ) 1,000 mg, Oral, 2 times daily PRN   alendronate  (FOSAMAX ) 70 mg, Oral, Weekly, Take with full glass of water on empty stomach   atorvastatin  (LIPITOR) 20 mg, Oral, Daily at bedtime   CALCIUM -VITAMIN D PO Take by mouth.   carboxymethylcellulose (REFRESH PLUS) 0.5 % SOLN 1 drop, Daily PRN   cyanocobalamin  1,000 mcg, 2 times daily   ferrous sulfate 325 mg, 3 times daily with meals   hydrochlorothiazide  (HYDRODIURIL ) 25 mg, Oral, Daily   lidocaine  (XYLOCAINE ) 2 % solution 15 mLs, Mouth/Throat, Every 4 hours PRN   lisinopril  (ZESTRIL ) 20 mg, Oral, Daily   loratadine  (CLARITIN ) 10 mg, Oral, Daily   Multiple Vitamin (MULTIVITAMIN) capsule 1 capsule, Daily   Multiple Vitamins-Minerals (PRESERVISION AREDS PO) Take by mouth.   verapamil  (CALAN -SR) 240 mg, Oral, Daily at bedtime       Objective:   Physical Exam BP (!) 152/70   Pulse 88   Temp 98 F (36.7 C) (Oral)   Resp 18   Ht 5' 6 (1.676 m)   Wt 187 lb 2 oz (84.9 kg)   SpO2 98%  BMI 30.20 kg/m  General:   Well developed, NAD, BMI noted. HEENT:  Normocephalic . Face symmetric, atraumatic Lungs:  CTA B Normal respiratory effort, no intercostal retractions, no accessory muscle use. Heart: RRR,  no murmur.  Lower extremities: no pretibial edema bilaterally  Skin: Not pale. Not jaundice Neurologic:  alert & oriented X3.  Speech normal, gait appropriate for age and unassisted Psych--  Cognition and judgment appear intact.  Cooperative with normal attention span and concentration.  Behavior appropriate. No anxious or depressed appearing.      Assessment      Assessment  HTN Hyperlipidemia CKD, GFR 30 (2023) GERD DJD  Breast cancer, 2012, left, lumpectomy, XRT. Does not see oncology regularly  MSK: --T8  mild compression per x-ray  --DEXA 2014 and  2018: Normal. Vitamin D normal when checked -- T score 08/2023 - 1.0: , started fosamax  08/2023  Assessment & Plan HTN: Blood pressure control suboptimal with current regimen of verapamil , lisinopril , and hydrochlorothiazide . Home monitoring not performed. - Instructed to start checking blood pressure at home twice daily.  Goals provided, see AVS - Instructed to call in 10 days with blood pressure readings for potential medication adjustment. Pancytopenia: LOV with hematology 07/07/2024, she has thrombocytopenia, leukopenia and anemia. Had a bone marrow biopsy 07/20/2024.  To see hematology tomorrow to discuss results. Labs hemoglobin at hematology 7.7. Last creatinine stable at 1.35 Last vitamin B12: 2000. She has some DOE, likely for anemia.  Further management per hematology. Dementia?:  The patient's husband is here, he is concerned about pt's memory.  On exam she seems  oriented x 3, I did notice that she repeat things but that has been her normal for years.  Other than being forgetful, I do not think she has dementia. General Health Maintenance - Administered flu shot today. - Recommended considering a COVID vaccine at the pharmacy. RTC 3 months Time spent 31 minutes     "

## 2024-08-03 NOTE — Assessment & Plan Note (Signed)
 HTN: Blood pressure control suboptimal with current regimen of verapamil , lisinopril , and hydrochlorothiazide . Home monitoring not performed. - Instructed to start checking blood pressure at home twice daily.  Goals provided, see AVS - Instructed to call in 10 days with blood pressure readings for potential medication adjustment. Pancytopenia: LOV with hematology 07/07/2024, she has thrombocytopenia, leukopenia and anemia. Had a bone marrow biopsy 07/20/2024.  To see hematology tomorrow to discuss results. Labs hemoglobin at hematology 7.7. Last creatinine stable at 1.35 Last vitamin B12: 2000. She has some DOE, likely for anemia.  Further management per hematology. Dementia?:  The patient's husband is here, he is concerned about pt's memory.  On exam she seems  oriented x 3, I did notice that she repeat things but that has been her normal for years.  Other than being forgetful, I do not think she has dementia. General Health Maintenance - Administered flu shot today. - Recommended considering a COVID vaccine at the pharmacy. RTC 3 months

## 2024-08-03 NOTE — Patient Instructions (Addendum)
 Go to the front desk for the checkout Please make an appointment     You got a flu shot today.  Please consider getting COVID-vaccine.  Start taking your blood pressure regularly.  Please keep a record of the call in 10 days with your blood pressure readings Blood pressure goal:  between 110/65 and  135/85.    VISIT SUMMARY: Today, we addressed your fatigue, shortness of breath, and blood pressure management. We also updated your flu vaccination and discussed the importance of the COVID vaccine.  YOUR PLAN: FATIGUE AND SHORTNESS OF BREATH: You have been experiencing significant fatigue and shortness of breath, which may be related to your anemia. -Continue with your weekly infusions for anemia. -Monitor your symptoms and report any worsening.  HYPERTENSION: Your blood pressure is not well controlled with your current medications. -Start checking your blood pressure at home   -Call us  in 10 days with your blood pressure readings for potential medication adjustment.  GENERAL HEALTH MAINTENANCE: Your flu and COVID vaccinations are not up to date. -You received a flu shot today. -Consider getting a COVID vaccine at the pharmacy.

## 2024-08-04 ENCOUNTER — Encounter: Payer: Self-pay | Admitting: Hematology & Oncology

## 2024-08-04 ENCOUNTER — Inpatient Hospital Stay

## 2024-08-04 ENCOUNTER — Other Ambulatory Visit: Payer: Self-pay | Admitting: *Deleted

## 2024-08-04 ENCOUNTER — Telehealth: Payer: Self-pay

## 2024-08-04 ENCOUNTER — Inpatient Hospital Stay: Admitting: Hematology & Oncology

## 2024-08-04 ENCOUNTER — Other Ambulatory Visit: Payer: Self-pay

## 2024-08-04 VITALS — BP 126/36 | HR 79 | Temp 98.8°F | Resp 20 | Ht 66.0 in | Wt 187.0 lb

## 2024-08-04 DIAGNOSIS — D539 Nutritional anemia, unspecified: Secondary | ICD-10-CM

## 2024-08-04 DIAGNOSIS — D696 Thrombocytopenia, unspecified: Secondary | ICD-10-CM

## 2024-08-04 DIAGNOSIS — D72829 Elevated white blood cell count, unspecified: Secondary | ICD-10-CM

## 2024-08-04 DIAGNOSIS — D649 Anemia, unspecified: Secondary | ICD-10-CM

## 2024-08-04 DIAGNOSIS — R7689 Other specified abnormal immunological findings in serum: Secondary | ICD-10-CM

## 2024-08-04 DIAGNOSIS — D61818 Other pancytopenia: Secondary | ICD-10-CM

## 2024-08-04 DIAGNOSIS — D631 Anemia in chronic kidney disease: Secondary | ICD-10-CM

## 2024-08-04 DIAGNOSIS — D4620 Refractory anemia with excess of blasts, unspecified: Secondary | ICD-10-CM

## 2024-08-04 DIAGNOSIS — D46Z Other myelodysplastic syndromes: Secondary | ICD-10-CM | POA: Diagnosis not present

## 2024-08-04 LAB — CMP (CANCER CENTER ONLY)
ALT: 12 U/L (ref 0–44)
AST: 17 U/L (ref 15–41)
Albumin: 3.9 g/dL (ref 3.5–5.0)
Alkaline Phosphatase: 76 U/L (ref 38–126)
Anion gap: 14 (ref 5–15)
BUN: 32 mg/dL — ABNORMAL HIGH (ref 8–23)
CO2: 24 mmol/L (ref 22–32)
Calcium: 8.3 mg/dL — ABNORMAL LOW (ref 8.9–10.3)
Chloride: 104 mmol/L (ref 98–111)
Creatinine: 1.69 mg/dL — ABNORMAL HIGH (ref 0.44–1.00)
GFR, Estimated: 29 mL/min — ABNORMAL LOW
Glucose, Bld: 135 mg/dL — ABNORMAL HIGH (ref 70–99)
Potassium: 4 mmol/L (ref 3.5–5.1)
Sodium: 141 mmol/L (ref 135–145)
Total Bilirubin: 0.6 mg/dL (ref 0.0–1.2)
Total Protein: 6.6 g/dL (ref 6.5–8.1)

## 2024-08-04 LAB — CBC WITH DIFFERENTIAL (CANCER CENTER ONLY)
Abs Immature Granulocytes: 0.01 K/uL (ref 0.00–0.07)
Basophils Absolute: 0 K/uL (ref 0.0–0.1)
Basophils Relative: 0 %
Eosinophils Absolute: 0.1 K/uL (ref 0.0–0.5)
Eosinophils Relative: 3 %
HCT: 19.9 % — ABNORMAL LOW (ref 36.0–46.0)
Hemoglobin: 6.7 g/dL — CL (ref 12.0–15.0)
Immature Granulocytes: 1 %
Lymphocytes Relative: 46 %
Lymphs Abs: 0.9 K/uL (ref 0.7–4.0)
MCH: 35.8 pg — ABNORMAL HIGH (ref 26.0–34.0)
MCHC: 33.7 g/dL (ref 30.0–36.0)
MCV: 106.4 fL — ABNORMAL HIGH (ref 80.0–100.0)
Monocytes Absolute: 0.1 K/uL (ref 0.1–1.0)
Monocytes Relative: 6 %
Neutro Abs: 0.8 K/uL — ABNORMAL LOW (ref 1.7–7.7)
Neutrophils Relative %: 44 %
Platelet Count: 20 K/uL — ABNORMAL LOW (ref 150–400)
RBC: 1.87 MIL/uL — ABNORMAL LOW (ref 3.87–5.11)
RDW: 23.1 % — ABNORMAL HIGH (ref 11.5–15.5)
WBC Count: 1.9 K/uL — ABNORMAL LOW (ref 4.0–10.5)
nRBC: 1.1 % — ABNORMAL HIGH (ref 0.0–0.2)

## 2024-08-04 LAB — RETICULOCYTES
Immature Retic Fract: 14.2 % (ref 2.3–15.9)
RBC.: 1.87 MIL/uL — ABNORMAL LOW (ref 3.87–5.11)
Retic Count, Absolute: 43.9 K/uL (ref 19.0–186.0)
Retic Ct Pct: 2.4 % (ref 0.4–3.1)

## 2024-08-04 LAB — PREPARE RBC (CROSSMATCH)

## 2024-08-04 LAB — SAMPLE TO BLOOD BANK

## 2024-08-04 LAB — LACTATE DEHYDROGENASE: LDH: 98 U/L — ABNORMAL LOW (ref 105–235)

## 2024-08-04 MED ORDER — EPOETIN ALFA-EPBX 40000 UNIT/ML IJ SOLN
40000.0000 [IU] | INTRAMUSCULAR | Status: DC
  Administered 2024-08-04: 40000 [IU] via SUBCUTANEOUS
  Filled 2024-08-04: qty 1

## 2024-08-04 MED ORDER — PEGFILGRASTIM-FPGK 6 MG/0.6ML ~~LOC~~ SOSY
6.0000 mg | PREFILLED_SYRINGE | Freq: Once | SUBCUTANEOUS | Status: AC
  Administered 2024-08-04: 6 mg via SUBCUTANEOUS
  Filled 2024-08-04: qty 0.6

## 2024-08-04 MED ORDER — ROMIPLOSTIM 250 MCG ~~LOC~~ SOLR
2.0000 ug/kg | Freq: Once | SUBCUTANEOUS | Status: AC
  Administered 2024-08-04: 170 ug via SUBCUTANEOUS
  Filled 2024-08-04: qty 0.34

## 2024-08-04 NOTE — Patient Instructions (Signed)

## 2024-08-04 NOTE — Progress Notes (Signed)
 This Hematology and Oncology Follow Up Visit  Doris Mcdaniel [id number] Sep 13, 1935 88 y.o. 08/04/2024    Principle Diagnosis:  Hypoplastic myelodysplasia - TET2/ANKRD2   Current Therapy:   Imetelstat   7.1 mg/kg IV q month - start cycle #1 on 08/11/2024 Retacrit  40,000 units subcu weekly Neulasta  6 mg SQ q month Nplate  2 mg/Kg SQ q wk    Interim History:  Doris Mcdaniel is back for follow-up.  We did do a bone marrow biopsy on her.  This was done on 07/20/2024.  The pathology report ([id number]) showed a hypocellular bone marrow.  She did have some erythroid hyperplasia.  She had hyperplastic myeloid and megakaryocytic series.  Her cytogenetics were normal.  I have to believe that this is a form of myelodysplasia that is hypoplastic.  She is quite tired today.  Her hemoglobin is down to 6.7.  She just has incredibly low reticulocyte count.  We have given her Retacrit .  She is going to have to be transfused.  I had a long talk with she and her husband.  Her birthday is on Christmas.  I want her to have a nice birthday.  I told her that we are going to have to transfuse her.  I just want her feeling well for her birthday and for Christmas.  I think that we need to try something to try to help stimulate the bone marrow.  I will try her on Imetelstat  to see if this may help.  I will also try to have her on Nplate  and Neulasta  to try to get her platelets and her white cells up.  Again, I know this is very challenging.  I know that there are some uses of immunosuppressive therapy.  However, I am not sure if she would be a great candidate for immunosuppressive therapy.  I just want to try to get her blood counts up so she does not need to be transfused all the time.  She has had no fever.  She has had no nausea or vomiting.  There has been no change in bowel or bladder habits.  She has had no rashes.  She has had no leg swelling.  Overall, I would have to say that her  performance status is probably ECOG 2.   Wt Readings from Last 3 Encounters:  08/04/24 187 lb (84.8 kg)  08/03/24 187 lb 2 oz (84.9 kg)  07/07/24 184 lb 1.3 oz (83.5 kg)     Medications:   Current Outpatient Medications:    alendronate  (FOSAMAX ) 70 MG tablet, Take 1 tablet (70 mg total) by mouth once a week. Take with full glass of water on empty stomach, Disp: 12 tablet, Rfl: 3   atorvastatin  (LIPITOR) 20 MG tablet, Take 1 tablet (20 mg total) by mouth at bedtime., Disp: 90 tablet, Rfl: 1   hydrochlorothiazide  (HYDRODIURIL ) 25 MG tablet, Take 1 tablet (25 mg total) by mouth daily., Disp: 90 tablet, Rfl: 1   lisinopril  (ZESTRIL ) 20 MG tablet, Take 1 tablet (20 mg total) by mouth daily., Disp: 90 tablet, Rfl: 1   loratadine  (CLARITIN ) 10 MG tablet, Take 1 tablet (10 mg total) by mouth daily., Disp: 90 tablet, Rfl: 3   Multiple Vitamin (MULTIVITAMIN) capsule, Take 1 capsule by mouth daily., Disp: , Rfl:    Multiple Vitamins-Minerals (PRESERVISION AREDS PO), Take by mouth., Disp: , Rfl:    verapamil  (CALAN -SR) 240 MG CR tablet, Take 1 tablet (240 mg total) by mouth at bedtime., Disp: 90 tablet, Rfl: 1  acetaminophen  (TYLENOL ) 500 MG tablet, Take 2 tablets (1,000 mg total) by mouth 2 (two) times daily as needed for mild pain or moderate pain. (Patient not taking: Reported on 08/03/2024), Disp: 30 tablet, Rfl: 0   CALCIUM -VITAMIN D PO, Take by mouth., Disp: , Rfl:    carboxymethylcellulose (REFRESH PLUS) 0.5 % SOLN, Place 1 drop into both eyes daily as needed. (Patient not taking: Reported on 08/03/2024), Disp: , Rfl:  No current facility-administered medications for this visit.  Facility-Administered Medications Ordered in Other Visits:    epoetin  alfa-epbx (RETACRIT ) injection 40,000 Units, 40,000 Units, Subcutaneous, Weekly, Doris Grieves R, MD   pegfilgrastim -fpgk (STIMUFEND ) injection 6 mg, 6 mg, Subcutaneous, Once, Doris Mcdaniel, Doris SAUNDERS, MD   romiPLOStim  (NPLATE ) injection 170 mcg, 2  mcg/kg, Subcutaneous, Once, Doris Mcdaniel, Doris SAUNDERS, MD  Allergies: No Known Allergies  Past Medical History, Surgical history, Social history, and Family History were reviewed and updated.  Review of Systems: Review of Systems  Constitutional: Negative.   HENT:  Negative.    Eyes: Negative.   Respiratory: Negative.    Cardiovascular: Negative.   Gastrointestinal: Negative.   Endocrine: Negative.   Genitourinary: Negative.    Musculoskeletal: Negative.   Skin: Negative.   Neurological: Negative.   Hematological: Negative.   Psychiatric/Behavioral: Negative.     Physical Exam:  Vital signs show temperature 98.8.  Pulse 79.  Blood pressure 126/36.  Weight is 187 pounds.    Physical Exam Vitals reviewed.  HENT:     Head: Normocephalic and atraumatic.  Eyes:     Pupils: Pupils are equal, round, and reactive to light.  Cardiovascular:     Rate and Rhythm: Normal rate and regular rhythm.     Heart sounds: Normal heart sounds.  Pulmonary:     Effort: Pulmonary effort is normal.     Breath sounds: Normal breath sounds.  Abdominal:     General: Bowel sounds are normal.     Palpations: Abdomen is soft.  Musculoskeletal:        General: No tenderness or deformity. Normal range of motion.     Cervical back: Normal range of motion.  Lymphadenopathy:     Cervical: No cervical adenopathy.  Skin:    General: Skin is warm and dry.     Findings: No erythema or rash.  Neurological:     Mental Status: She is alert and oriented to person, place, and time.  Psychiatric:        Behavior: Behavior normal.        Thought Content: Thought content normal.        Judgment: Judgment normal.     Lab Results  Component Value Date   WBC 1.9 (L) 08/04/2024   HGB 6.7 (LL) 08/04/2024   HCT 19.9 (L) 08/04/2024   MCV 106.4 (H) 08/04/2024   PLT 20 (L) 08/04/2024     Chemistry      Component Value Date/Time   NA 141 08/04/2024 1144   K 4.0 08/04/2024 1144   CL 104 08/04/2024 1144   CO2 24  08/04/2024 1144   BUN 32 (H) 08/04/2024 1144   CREATININE 1.69 (H) 08/04/2024 1144      Component Value Date/Time   CALCIUM  8.3 (L) 08/04/2024 1144   ALKPHOS 76 08/04/2024 1144   AST 17 08/04/2024 1144   ALT 12 08/04/2024 1144   BILITOT 0.6 08/04/2024 1144        Assessment and Plan-   Ms. Pecora is a very nice 88 year old white female.  She has pancytopenia.  She finally had a bone marrow biopsy that was adequate.  It looks like she has a hypoplastic bone marrow.  She has erythroid hyperplasia.  Had to believe that this is a form of myelodysplasia.  Again, we have to try to get her bone marrow stimulated.  I will see about using Imetelstat  (Rytelo ).  Hopefully, we can see if the Neulasta  and the Nplate  might help.  I know this is quite challenging.  Given her overall age and performance status we have to be quite careful.  Again, this is all about quality of life.  I told her she and her husband about all of this.  I told her that we really need to try to decrease the transfusion requirements.  I told her that if she becomes resistant to blood transfusions then I think we are really looking at a short time period before she passes on.  I know she is okay with this.  She has talked about this at length.  She realized that she is quite mature and that if she were to go tonight, she would be okay with this.  Again, she has a decent performance status.  I want to try to help get her blood counts better so we can try to improve her quality of life.  I will see about getting the Imetelstat  going next week.  She is to come in weekly for her labs.  Doris JONELLE Crease, MD 06/10/2024

## 2024-08-04 NOTE — Telephone Encounter (Signed)
 Lab called with critical results of Hgb 6.7. Dr. Timmy informed. Patient will receive a blood transfusion tomorrow.

## 2024-08-05 ENCOUNTER — Other Ambulatory Visit: Payer: Self-pay | Admitting: *Deleted

## 2024-08-05 ENCOUNTER — Encounter: Payer: Self-pay | Admitting: Hematology & Oncology

## 2024-08-05 ENCOUNTER — Inpatient Hospital Stay

## 2024-08-05 ENCOUNTER — Telehealth: Payer: Self-pay | Admitting: *Deleted

## 2024-08-05 ENCOUNTER — Other Ambulatory Visit: Payer: Self-pay

## 2024-08-05 DIAGNOSIS — D46Z Other myelodysplastic syndromes: Secondary | ICD-10-CM | POA: Diagnosis not present

## 2024-08-05 DIAGNOSIS — D649 Anemia, unspecified: Secondary | ICD-10-CM

## 2024-08-05 MED ORDER — SODIUM CHLORIDE 0.9% IV SOLUTION
250.0000 mL | INTRAVENOUS | Status: DC
  Administered 2024-08-05: 250 mL via INTRAVENOUS

## 2024-08-05 MED ORDER — ACETAMINOPHEN 325 MG PO TABS: 650.0000 mg | ORAL_TABLET | Freq: Once | ORAL | Status: DC

## 2024-08-05 MED ORDER — DIPHENHYDRAMINE HCL 25 MG PO CAPS: 25.0000 mg | ORAL_CAPSULE | Freq: Once | ORAL | Status: DC

## 2024-08-05 NOTE — Telephone Encounter (Signed)
 Opened in error

## 2024-08-05 NOTE — Patient Instructions (Signed)

## 2024-08-06 LAB — TYPE AND SCREEN
ABO/RH(D): O NEG
Antibody Screen: NEGATIVE
Unit division: 0

## 2024-08-06 LAB — BPAM RBC
Blood Product Expiration Date: 202601032359
ISSUE DATE / TIME: 202512240744
Unit Type and Rh: 9500

## 2024-08-11 ENCOUNTER — Inpatient Hospital Stay

## 2024-08-12 ENCOUNTER — Inpatient Hospital Stay

## 2024-08-12 ENCOUNTER — Other Ambulatory Visit: Payer: Self-pay | Admitting: Family

## 2024-08-12 ENCOUNTER — Telehealth: Payer: Self-pay | Admitting: Internal Medicine

## 2024-08-12 ENCOUNTER — Encounter: Payer: Self-pay | Admitting: Hematology & Oncology

## 2024-08-12 VITALS — BP 143/49 | HR 76 | Temp 98.5°F | Resp 20

## 2024-08-12 DIAGNOSIS — D46Z Other myelodysplastic syndromes: Secondary | ICD-10-CM | POA: Diagnosis not present

## 2024-08-12 DIAGNOSIS — L089 Local infection of the skin and subcutaneous tissue, unspecified: Secondary | ICD-10-CM

## 2024-08-12 DIAGNOSIS — D649 Anemia, unspecified: Secondary | ICD-10-CM

## 2024-08-12 DIAGNOSIS — D4620 Refractory anemia with excess of blasts, unspecified: Secondary | ICD-10-CM

## 2024-08-12 LAB — CBC WITH DIFFERENTIAL (CANCER CENTER ONLY)
Abs Immature Granulocytes: 0.03 K/uL (ref 0.00–0.07)
Basophils Absolute: 0 K/uL (ref 0.0–0.1)
Basophils Relative: 0 %
Eosinophils Absolute: 0 K/uL (ref 0.0–0.5)
Eosinophils Relative: 1 %
HCT: 20 % — ABNORMAL LOW (ref 36.0–46.0)
Hemoglobin: 6.9 g/dL — CL (ref 12.0–15.0)
Immature Granulocytes: 1 %
Lymphocytes Relative: 27 %
Lymphs Abs: 0.7 K/uL (ref 0.7–4.0)
MCH: 35.9 pg — ABNORMAL HIGH (ref 26.0–34.0)
MCHC: 34.5 g/dL (ref 30.0–36.0)
MCV: 104.2 fL — ABNORMAL HIGH (ref 80.0–100.0)
Monocytes Absolute: 0.1 K/uL (ref 0.1–1.0)
Monocytes Relative: 5 %
Neutro Abs: 1.8 K/uL (ref 1.7–7.7)
Neutrophils Relative %: 66 %
Platelet Count: 9 K/uL — CL (ref 150–400)
RBC: 1.92 MIL/uL — ABNORMAL LOW (ref 3.87–5.11)
RDW: 22.6 % — ABNORMAL HIGH (ref 11.5–15.5)
WBC Count: 2.7 K/uL — ABNORMAL LOW (ref 4.0–10.5)
nRBC: 0 % (ref 0.0–0.2)

## 2024-08-12 LAB — PREPARE RBC (CROSSMATCH)

## 2024-08-12 LAB — SAMPLE TO BLOOD BANK

## 2024-08-12 MED ORDER — SODIUM CHLORIDE 0.9 % IV SOLN: INTRAVENOUS | Status: DC

## 2024-08-12 MED ORDER — SODIUM CHLORIDE 0.9 % IV SOLN
7.1000 mg/kg | Freq: Once | INTRAVENOUS | Status: AC
  Administered 2024-08-12: 603 mg via INTRAVENOUS
  Filled 2024-08-12: qty 18

## 2024-08-12 MED ORDER — DEXAMETHASONE SOD PHOSPHATE PF 10 MG/ML IJ SOLN
10.0000 mg | Freq: Once | INTRAMUSCULAR | Status: AC
  Administered 2024-08-12: 10 mg via INTRAVENOUS

## 2024-08-12 MED ORDER — ONDANSETRON HCL 8 MG PO TABS: 8.0000 mg | ORAL_TABLET | Freq: Three times a day (TID) | ORAL | 1 refills | Status: AC | PRN

## 2024-08-12 MED ORDER — PROCHLORPERAZINE MALEATE 10 MG PO TABS: 10.0000 mg | ORAL_TABLET | Freq: Four times a day (QID) | ORAL | 1 refills | Status: DC | PRN

## 2024-08-12 MED ORDER — METOPROLOL SUCCINATE ER 25 MG PO TB24: 12.5000 mg | ORAL_TABLET | Freq: Every day | ORAL | 1 refills | Status: AC

## 2024-08-12 MED ORDER — DOXYCYCLINE HYCLATE 100 MG PO TABS: 100.0000 mg | ORAL_TABLET | Freq: Two times a day (BID) | ORAL | 0 refills | Status: DC

## 2024-08-12 MED ORDER — DIPHENHYDRAMINE HCL 25 MG PO CAPS
25.0000 mg | ORAL_CAPSULE | Freq: Once | ORAL | Status: AC
  Administered 2024-08-12: 25 mg via ORAL
  Filled 2024-08-12: qty 1

## 2024-08-12 NOTE — Progress Notes (Signed)
 Ok to treat with Hgb 6.9 and Platelets 9 per Dr. Timmy.   Patient complains of swollen, reddened, warm to the touch right ring finger. Patient states she doesn't remember injuring the finger or being bitten. Patient states she noticed the finger being sore and difficult to bend x 2 days. Lauraine Pepper, NP notified.

## 2024-08-12 NOTE — Progress Notes (Signed)
 Tolerated tmt well. Dr. Ennever in infusion room post tmt to discuss action plan with pt. and husband. Per Dr. Timmy, ok to monitor x instead of one hour today. Pt. does not have Mychart and does not wish to have Mychart. AVS and appt. reviewed with pt. and husband.

## 2024-08-12 NOTE — Patient Instructions (Addendum)
 [facility] - A DEPT OF [facility]  Discharge Instructions: Thank you for choosing [facility] to provide your oncology and hematology care.   If you have a lab appointment with the Cancer Center, please go directly to the Cancer Center and check in at the registration area.  Wear comfortable clothing and clothing appropriate for easy access to any Portacath or PICC line.   We strive to give you quality time with your provider. You may need to reschedule your appointment if you arrive late (15 or more minutes).  Arriving late affects you and other patients whose appointments are after yours.  Also, if you miss three or more appointments without notifying the office, you may be dismissed from the clinic at the providers discretion.      For prescription refill requests, have your pharmacy contact our office and allow 72 hours for refills to be completed.    Today you received the following chemotherapy and/or immunotherapy agents Rytelo       To help prevent nausea and vomiting after your treatment, we encourage you to take your nausea medication as directed.  BELOW ARE SYMPTOMS THAT SHOULD BE REPORTED IMMEDIATELY: *FEVER GREATER THAN 100.4 F (38 C) OR HIGHER *CHILLS OR SWEATING *NAUSEA AND VOMITING THAT IS NOT CONTROLLED WITH YOUR NAUSEA MEDICATION *UNUSUAL SHORTNESS OF BREATH *UNUSUAL BRUISING OR BLEEDING *URINARY PROBLEMS (pain or burning when urinating, or frequent urination) *BOWEL PROBLEMS (unusual diarrhea, constipation, pain near the anus) TENDERNESS IN MOUTH AND THROAT WITH OR WITHOUT PRESENCE OF ULCERS (sore throat, sores in mouth, or a toothache) UNUSUAL RASH, SWELLING OR PAIN  UNUSUAL VAGINAL DISCHARGE OR ITCHING   Items with * indicate a potential emergency and should be followed up as soon as possible or go to the Emergency Department if any problems should occur.  Please show the CHEMOTHERAPY ALERT CARD or IMMUNOTHERAPY ALERT  CARD at check-in to the Emergency Department and triage nurse. Should you have questions after your visit or need to cancel or reschedule your appointment, please contact [facility] - A DEPT OF [facility]  [phone] and follow the prompts.  Office hours are 8:00 a.m. to 4:30 p.m. Monday - Friday. Please note that voicemails left after 4:00 p.m. may not be returned until the following business day.  We are closed weekends and major holidays. You have access to a nurse at all times for urgent questions. Please call the main number to the clinic [phone] and follow the prompts.  For any non-urgent questions, you may also contact your provider using MyChart. We now offer e-Visits for anyone 48 and older to request care online for non-urgent symptoms. For details visit [email or web address].   Also download the MyChart app! Go to the app store, search MyChart, open the app, select [facility] [place], and log in with your MyChart username and password.  Imetelstat  Injection What is this medication? IMETELSTAT  (im e TEL stat) treats myelodysplastic syndrome (MDS), a type of blood and bone marrow cancer. It works by blocking a protein that causes cancer cells to grow and multiply. This helps to slow or stop the spread of cancer cells. This medicine may be used for other purposes; ask your health care provider or pharmacist if you have questions. COMMON BRAND NAME(S): Rytelo  What should I tell my care team before I take this medication? They need to know if you have any of these conditions: Low platelet levels Low white blood  cell levels An unusual or allergic reaction to imetelstat , other medications, foods, dyes, or preservatives Pregnant or trying to get pregnant Breastfeeding How should I use this medication? This medication is infused into a vein. It is given by your care team in a hospital or clinic setting. Talk to your care team about the use of this  medication in children. Special care may be needed. Overdosage: If you think you have taken too much of this medicine contact a poison control center or emergency room at once. NOTE: This medicine is only for you. Do not share this medicine with others. What if I miss a dose? Keep appointments for follow-up doses. It is important not to miss your dose. Call your care team if you are unable to keep an appointment. What may interact with this medication? Interactions have not been studied. This list may not describe all possible interactions. Give your health care provider a list of all the medicines, herbs, non-prescription drugs, or dietary supplements you use. Also tell them if you smoke, drink alcohol, or use illegal drugs. Some items may interact with your medicine. What should I watch for while using this medication? Visit your care team for regular checks on your progress. Report any side effects. Continue your course of treatment even though you have side effects unless your care team tells you to stop. You may need blood work done while you are taking this medication. This medication may increase your risk of getting an infection. Call your care team for advice if you get a fever, chills, sore throat, or other symptoms of a cold or flu. Do not treat yourself. Try to avoid being around people who are sick. Avoid taking medications that contain aspirin , acetaminophen , ibuprofen, naproxen, or ketoprofen unless instructed by your care team. These medications may hide a fever. Be careful brushing or flossing your teeth or using a toothpick because you may get an infection or bleed more easily. If you have any dental work done, tell your dentist you are receiving this medication. Talk to your care team if you may be pregnant. Serious birth defects can occur if you take this medication during pregnancy and for 1 week after the last dose. You will need a negative pregnancy test before starting this  medication. Contraception is recommended while taking this medication and for 1 week after the last dose. Your care team can help you find the option that works for you. Do not breastfeed while taking this medication and for 1 week after the last dose. This medication may cause infertility. It is usually temporary. Talk to your care team if you are concerned about your fertility. What side effects may I notice from receiving this medication? Side effects that you should report to your care team as soon as possible: Allergic reactions--skin rash, itching, hives, swelling of the face, lips, tongue, or throat Infection--fever, chills, cough, sore throat, wounds that don't heal, pain or trouble when passing urine, general feeling of discomfort or being unwell Infusion reactions--chest pain, shortness of breath or trouble breathing, feeling faint or lightheaded Liver injury--right upper belly pain, loss of appetite, nausea, light-colored stool, dark yellow or brown urine, yellowing skin or eyes, unusual weakness or fatigue Unusual bruising or bleeding Side effects that usually do not require medical attention (report these to your care team if they continue or are bothersome): Bone, joint, or muscle pain Fatigue Headache This list may not describe all possible side effects. Call your doctor for medical advice about side  effects. You may report side effects to FDA at 1-800-FDA-1088. Where should I keep my medication? This medication is given in a hospital or clinic. It will not be stored at home. NOTE: This sheet is a summary. It may not cover all possible information. If you have questions about this medicine, talk to your doctor, pharmacist, or health care provider.  2024 Elsevier/Gold Standard (2023-07-12 00:00:00)

## 2024-08-12 NOTE — Telephone Encounter (Signed)
 The patient left for me ambulatory BPs log: Readings range from 128/56 to 170/64. BP is mostly in the higher side. Heart rate is in the 70s to 80s. She is tolerating Calan  SR 240 mg. Will start a low-dose of metoprolol XL 25 mg half tablet daily, prescription sent. Called patient, left message regards above, advised her to call me in 10 days and let me know how BP and heart rate are going

## 2024-08-12 NOTE — Progress Notes (Signed)
 Critical result received from lab of hemoglobin 6.9 and platelets 9  Dr. Timmy aware and orders received to transfuse 2 units of PRBC on 08/14/2024 and one unit of platelets today 08/12/2024. Per Dr. Timmy ok to treat with Rytelo  despite counts. Pt aware of plan and aware to keep blue blood bracelet on. Pt had no further needs identified.

## 2024-08-13 LAB — PREPARE PLATELET PHERESIS: Unit division: 0

## 2024-08-13 LAB — BPAM PLATELET PHERESIS
Blood Product Expiration Date: 202601032359
ISSUE DATE / TIME: 202512311237
Unit Type and Rh: 7300

## 2024-08-14 ENCOUNTER — Inpatient Hospital Stay: Attending: Medical Oncology

## 2024-08-14 DIAGNOSIS — D4620 Refractory anemia with excess of blasts, unspecified: Secondary | ICD-10-CM

## 2024-08-14 DIAGNOSIS — D61818 Other pancytopenia: Secondary | ICD-10-CM | POA: Insufficient documentation

## 2024-08-14 DIAGNOSIS — D696 Thrombocytopenia, unspecified: Secondary | ICD-10-CM | POA: Insufficient documentation

## 2024-08-14 DIAGNOSIS — Z79899 Other long term (current) drug therapy: Secondary | ICD-10-CM | POA: Insufficient documentation

## 2024-08-14 MED ORDER — SODIUM CHLORIDE 0.9% IV SOLUTION
250.0000 mL | INTRAVENOUS | Status: DC
  Administered 2024-08-14: 100 mL via INTRAVENOUS

## 2024-08-14 NOTE — Patient Instructions (Signed)

## 2024-08-17 LAB — BPAM RBC
Blood Product Expiration Date: 202601162359
Blood Product Expiration Date: 202601172359
ISSUE DATE / TIME: 202601020650
ISSUE DATE / TIME: 202601020650
Unit Type and Rh: 9500
Unit Type and Rh: 9500

## 2024-08-17 LAB — TYPE AND SCREEN
ABO/RH(D): O NEG
Antibody Screen: NEGATIVE
Unit division: 0
Unit division: 0

## 2024-08-18 ENCOUNTER — Other Ambulatory Visit (HOSPITAL_COMMUNITY): Payer: Self-pay

## 2024-08-18 ENCOUNTER — Telehealth: Payer: Self-pay

## 2024-08-18 NOTE — Telephone Encounter (Signed)
 Oral Oncology Patient Advocate Encounter   Received notification that prior authorization for Ondansetron   is required.   PA submitted on 08/18/2024 Key [id number] Status is pending      Charlott Hamilton,  CPhT-Adv  she/her/hers [facility] [address]  [facility] Pharmacy Technician Patient Advocate Specialist III [facility] Phone: [phone]  Fax: [phone] [email or web address]

## 2024-08-20 ENCOUNTER — Encounter: Payer: Self-pay | Admitting: Hematology & Oncology

## 2024-08-20 ENCOUNTER — Other Ambulatory Visit (HOSPITAL_COMMUNITY): Payer: Self-pay

## 2024-08-24 ENCOUNTER — Other Ambulatory Visit (HOSPITAL_COMMUNITY): Payer: Self-pay

## 2024-08-24 NOTE — Telephone Encounter (Signed)
" °  Follow up on PA of Ondansetron . Clarified diagnosis in which prescribed.  Status: Still Pending "

## 2024-08-26 ENCOUNTER — Other Ambulatory Visit (HOSPITAL_COMMUNITY): Payer: Self-pay

## 2024-08-26 NOTE — Telephone Encounter (Signed)
 Oral Oncology Patient Advocate Encounter  Prior Authorization for Ondansetron  has been approved.    Request ID [id number] Effective dates: 08/25/2024 through 08/12/2025  Patients co-pay is $0.00   [employer] has been notified  Charlott Hamilton,  CPhT-Adv  she/her/hers [employer] [facility]  [facility] Pharmacy Technician Patient Advocate Specialist III [facility] Phone: [phone]  Fax: [phone] [email or web address]

## 2024-09-04 ENCOUNTER — Telehealth: Payer: Self-pay | Admitting: Internal Medicine

## 2024-09-04 DIAGNOSIS — I10 Essential (primary) hypertension: Secondary | ICD-10-CM

## 2024-09-04 MED ORDER — LISINOPRIL 20 MG PO TABS: 20.0000 mg | ORAL_TABLET | Freq: Every day | ORAL | 1 refills | Status: AC

## 2024-09-04 NOTE — Telephone Encounter (Signed)
 LMOM asking for call back. Okay to relay.

## 2024-09-04 NOTE — Telephone Encounter (Addendum)
 The patient has been dropped BP readings log. 149/64, 153/49, 148/78, 152/58. Heart rate 50s and 60s. Needs better control. Please call patient.    Is she taking lisinopril ?  Was it discontinued 08/04/2024?Doris Mcdaniel If it was stopped, I think it is safe to go back on lisinopril  20 mg daily. BMP in 2 weeks Otherwise let me know

## 2024-09-04 NOTE — Telephone Encounter (Signed)
 Spoke w/ Pt- Dr Timmy had her stop the lisinopril  20mg , informed her to start back. Pt verbalized understanding. Informed needs BMP in 2 weeks, she states she will have blood work again on 09/14/24 at [place] office.

## 2024-09-04 NOTE — Telephone Encounter (Deleted)
 LMOM asking for call back. Okay to relay on call back. Rx sent. BMP ordered.

## 2024-09-11 ENCOUNTER — Inpatient Hospital Stay

## 2024-09-11 ENCOUNTER — Other Ambulatory Visit: Payer: Self-pay

## 2024-09-11 ENCOUNTER — Encounter (HOSPITAL_COMMUNITY): Payer: Self-pay | Admitting: Hematology & Oncology

## 2024-09-11 ENCOUNTER — Encounter: Payer: Self-pay | Admitting: Hematology & Oncology

## 2024-09-11 ENCOUNTER — Encounter (HOSPITAL_COMMUNITY): Payer: Self-pay

## 2024-09-11 ENCOUNTER — Inpatient Hospital Stay: Admitting: Hematology & Oncology

## 2024-09-11 ENCOUNTER — Observation Stay (HOSPITAL_COMMUNITY)
Admission: RE | Admit: 2024-09-11 | Discharge: 2024-09-12 | Disposition: A | Source: Ambulatory Visit | Attending: Hematology & Oncology | Admitting: Hematology & Oncology

## 2024-09-11 VITALS — BP 136/30 | HR 85 | Temp 97.7°F | Resp 20 | Ht 66.0 in | Wt 186.8 lb

## 2024-09-11 DIAGNOSIS — D469 Myelodysplastic syndrome, unspecified: Principal | ICD-10-CM | POA: Diagnosis present

## 2024-09-11 DIAGNOSIS — D4620 Refractory anemia with excess of blasts, unspecified: Secondary | ICD-10-CM

## 2024-09-11 DIAGNOSIS — D61818 Other pancytopenia: Secondary | ICD-10-CM

## 2024-09-11 LAB — CBC WITH DIFFERENTIAL (CANCER CENTER ONLY)
Abs Immature Granulocytes: 0.02 10*3/uL (ref 0.00–0.07)
Basophils Absolute: 0 10*3/uL (ref 0.0–0.1)
Basophils Relative: 0 %
Eosinophils Absolute: 0.1 10*3/uL (ref 0.0–0.5)
Eosinophils Relative: 2 %
HCT: 14.3 % — ABNORMAL LOW (ref 36.0–46.0)
Hemoglobin: 4.9 g/dL — CL (ref 12.0–15.0)
Immature Granulocytes: 1 %
Lymphocytes Relative: 56 %
Lymphs Abs: 1.4 10*3/uL (ref 0.7–4.0)
MCH: 32.2 pg (ref 26.0–34.0)
MCHC: 34.3 g/dL (ref 30.0–36.0)
MCV: 94.1 fL (ref 80.0–100.0)
Monocytes Absolute: 0.1 10*3/uL (ref 0.1–1.0)
Monocytes Relative: 4 %
Neutro Abs: 0.9 10*3/uL — ABNORMAL LOW (ref 1.7–7.7)
Neutrophils Relative %: 37 %
Platelet Count: 6 10*3/uL — CL (ref 150–400)
RBC: 1.52 MIL/uL — ABNORMAL LOW (ref 3.87–5.11)
RDW: 24.4 % — ABNORMAL HIGH (ref 11.5–15.5)
Smear Review: NORMAL
WBC Count: 2.5 10*3/uL — ABNORMAL LOW (ref 4.0–10.5)
nRBC: 0 % (ref 0.0–0.2)

## 2024-09-11 LAB — SAMPLE TO BLOOD BANK

## 2024-09-11 LAB — CMP (CANCER CENTER ONLY)
ALT: 11 U/L (ref 0–44)
AST: 17 U/L (ref 15–41)
Albumin: 3.7 g/dL (ref 3.5–5.0)
Alkaline Phosphatase: 80 U/L (ref 38–126)
Anion gap: 16 — ABNORMAL HIGH (ref 5–15)
BUN: 28 mg/dL — ABNORMAL HIGH (ref 8–23)
CO2: 21 mmol/L — ABNORMAL LOW (ref 22–32)
Calcium: 8.5 mg/dL — ABNORMAL LOW (ref 8.9–10.3)
Chloride: 105 mmol/L (ref 98–111)
Creatinine: 1.51 mg/dL — ABNORMAL HIGH (ref 0.44–1.00)
GFR, Estimated: 33 mL/min — ABNORMAL LOW
Glucose, Bld: 130 mg/dL — ABNORMAL HIGH (ref 70–99)
Potassium: 3.6 mmol/L (ref 3.5–5.1)
Sodium: 142 mmol/L (ref 135–145)
Total Bilirubin: 0.6 mg/dL (ref 0.0–1.2)
Total Protein: 6.4 g/dL — ABNORMAL LOW (ref 6.5–8.1)

## 2024-09-11 LAB — RETICULOCYTES
Immature Retic Fract: 10.1 % (ref 2.3–15.9)
RBC.: 1.53 MIL/uL — ABNORMAL LOW (ref 3.87–5.11)
Retic Count, Absolute: 15.3 10*3/uL — ABNORMAL LOW (ref 19.0–186.0)
Retic Ct Pct: 1 % (ref 0.4–3.1)

## 2024-09-11 LAB — PREPARE RBC (CROSSMATCH)

## 2024-09-11 LAB — SAVE SMEAR(SSMR), FOR PROVIDER SLIDE REVIEW

## 2024-09-11 MED ORDER — SODIUM CHLORIDE 0.9% IV SOLUTION: Freq: Once | INTRAVENOUS | Status: AC

## 2024-09-11 MED ORDER — SODIUM CHLORIDE 0.9% FLUSH: 10.0000 mL | INTRAVENOUS | Status: DC | PRN

## 2024-09-11 MED ORDER — SODIUM CHLORIDE 0.9 % IV SOLN: INTRAVENOUS | Status: DC

## 2024-09-11 MED ORDER — FUROSEMIDE 10 MG/ML IJ SOLN
20.0000 mg | Freq: Once | INTRAMUSCULAR | Status: AC
  Administered 2024-09-11: 20 mg via INTRAVENOUS
  Filled 2024-09-11: qty 2

## 2024-09-11 MED ORDER — SODIUM CHLORIDE 0.9% FLUSH: 3.0000 mL | INTRAVENOUS | Status: DC | PRN

## 2024-09-11 MED ORDER — SODIUM CHLORIDE 0.9% IV SOLUTION: 250.0000 mL | INTRAVENOUS | Status: DC

## 2024-09-11 MED ORDER — HEPARIN SOD (PORK) LOCK FLUSH 100 UNIT/ML IV SOLN
250.0000 [IU] | INTRAVENOUS | Status: DC | PRN
  Filled 2024-09-11: qty 2.5

## 2024-09-11 MED ORDER — FOLIC ACID 1 MG PO TABS
1.0000 mg | ORAL_TABLET | Freq: Every day | ORAL | Status: DC
  Administered 2024-09-11: 1 mg via ORAL
  Filled 2024-09-11: qty 1

## 2024-09-11 MED ORDER — HEPARIN SOD (PORK) LOCK FLUSH 100 UNIT/ML IV SOLN
500.0000 [IU] | Freq: Every day | INTRAVENOUS | Status: DC | PRN
  Filled 2024-09-11: qty 5

## 2024-09-11 MED ORDER — FUROSEMIDE 10 MG/ML IJ SOLN: 20.0000 mg | Freq: Once | INTRAMUSCULAR | Status: DC

## 2024-09-11 MED ORDER — ACETAMINOPHEN 325 MG PO TABS
650.0000 mg | ORAL_TABLET | Freq: Once | ORAL | Status: AC
  Administered 2024-09-11: 650 mg via ORAL
  Filled 2024-09-11: qty 2

## 2024-09-11 MED ORDER — THIAMINE MONONITRATE 100 MG PO TABS
100.0000 mg | ORAL_TABLET | Freq: Every day | ORAL | Status: DC
  Administered 2024-09-11: 100 mg via ORAL
  Filled 2024-09-11: qty 1

## 2024-09-11 MED ORDER — DIPHENHYDRAMINE HCL 25 MG PO CAPS
25.0000 mg | ORAL_CAPSULE | Freq: Once | ORAL | Status: AC
  Administered 2024-09-11: 25 mg via ORAL
  Filled 2024-09-11: qty 1

## 2024-09-11 NOTE — Progress Notes (Signed)
 Critical result received from lab of hemoglobin 4.9 and platelet 6  Dr. Timmy aware and plans to assess patient and send to ER for admission.  Pt to be direct admitted at [facility] by Dr. Timmy Per bed placement pt bed will be ready by 2 pm today in [facility], room 1321. Pt and family aware and agreeable to plan.

## 2024-09-11 NOTE — Progress Notes (Signed)
 This Hematology and Oncology Follow Up Visit  Doris Mcdaniel [id number] 1936-03-09 89 y.o. 09/11/2024    Principle Diagnosis:  Hypoplastic myelodysplasia - TET2/ANKRD2   Current Therapy:   Imetelstat   7.1 mg/kg IV q month - start cycle #1 on 08/11/2024 Retacrit  40,000 units subcu weekly Neulasta  6 mg SQ q month Nplate  2 mg/Kg SQ q wk    Interim History:  Doris Mcdaniel is back for follow-up.  Unfortunately, I just do not think that we are getting any kind of response with the Imetestat.  Her blood counts are worse have seen.  Her hemoglobin is 4.9.  Her platelet count is 6000.  I talked to her at length.  I am just not sure if we will going to make any kind of impact with the  Imetelstat .  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  Unfortunately, we have to do a transfusion on her.  With the snowstorm coming, we just are not can be able to do it as an outpatient..  I talked her at length about the importance of getting her transfused.  This will certainly take stress off her heart..  I told her that we would admit her and do the transfusion as an inpatient.  She can then go home the next day.  I told her that the importance of being transfused.  With her platelet count still low, she is at a significant risk for bleeding.  The bleeding could be catastrophic.  I know that she does not feel confident about going to the hospital.  She just does not like having to be in the hospital.  However, I told her that we would do our best to make sure that she is in less than 1 day.  After long discussion, she agreed to be admitted.    I really do feel bad.  I just hate that were not making any impact on her blood counts.  I know this is quite disheartening.  She asked me how long I thought that she had the liver.  I told her that if we cannot get her blood counts improved, I would be quite surprised if  she made more than 3 months.  I am not surprised that she understood this.  What I have to talk to her about end-of-life issues I think.  I can so I do this while she is in the hospital.  I am sure that she would not want to be kept alive artificially.  I think that if she were to be put on life support, she would never come off.  At the present time, I would say that her performance status is by ECOG 2.    Wt Readings from Last 3 Encounters:  09/11/24 186 lb 12.8 oz (84.7 kg)  08/04/24 187 lb (84.8 kg)  08/03/24 187 lb 2 oz (84.9 kg)     Medications:   Current Outpatient Medications:    acetaminophen  (TYLENOL ) 500 MG tablet, Take 2 tablets (1,000 mg total) by mouth 2 (two) times daily as needed for mild pain or moderate pain., Disp: 30 tablet, Rfl: 0   alendronate  (FOSAMAX ) 70 MG tablet, Take 1 tablet (70 mg total) by mouth once a week. Take with full glass of water on empty stomach, Disp: 12 tablet, Rfl: 3   atorvastatin  (LIPITOR) 20 MG tablet, Take 1 tablet (20 mg total) by mouth at bedtime.,  Disp: 90 tablet, Rfl: 1   CALCIUM -VITAMIN D PO, Take by mouth., Disp: , Rfl:    carboxymethylcellulose (REFRESH PLUS) 0.5 % SOLN, Place 1 drop into both eyes daily as needed., Disp: , Rfl:    hydrochlorothiazide  (HYDRODIURIL ) 25 MG tablet, Take 1 tablet (25 mg total) by mouth daily., Disp: 90 tablet, Rfl: 1   lisinopril  (ZESTRIL ) 20 MG tablet, Take 1 tablet (20 mg total) by mouth daily., Disp: 30 tablet, Rfl: 1   loratadine  (CLARITIN ) 10 MG tablet, Take 1 tablet (10 mg total) by mouth daily., Disp: 90 tablet, Rfl: 3   metoprolol  succinate (TOPROL -XL) 25 MG 24 hr tablet, Take 0.5 tablets (12.5 mg total) by mouth daily., Disp: 30 tablet, Rfl: 1   Multiple Vitamin (MULTIVITAMIN) capsule, Take 1 capsule by mouth daily., Disp: , Rfl:    Multiple Vitamins-Minerals (PRESERVISION AREDS PO), Take by mouth., Disp: , Rfl:    ondansetron  (ZOFRAN ) 8 MG tablet, Take 1 tablet (8 mg total) by mouth every 8  (eight) hours as needed for nausea or vomiting., Disp: 30 tablet, Rfl: 1   verapamil  (CALAN -SR) 240 MG CR tablet, Take 1 tablet (240 mg total) by mouth at bedtime., Disp: 90 tablet, Rfl: 1  Allergies: No Known Allergies  Past Medical History, Surgical history, Social history, and Family History were reviewed and updated.  Review of Systems: Review of Systems  Constitutional: Negative.   HENT:  Negative.    Eyes: Negative.   Respiratory: Negative.    Cardiovascular: Negative.   Gastrointestinal: Negative.   Endocrine: Negative.   Genitourinary: Negative.    Musculoskeletal: Negative.   Skin: Negative.   Neurological: Negative.   Hematological: Negative.   Psychiatric/Behavioral: Negative.     Physical Exam:  Vital signs show temperature 97.7.  Pulse 85.  Blood pressure 136/30.  Weight is 186 pounds.   Physical Exam Vitals reviewed.  HENT:     Head: Normocephalic and atraumatic.  Eyes:     Pupils: Pupils are equal, round, and reactive to light.  Cardiovascular:     Rate and Rhythm: Normal rate and regular rhythm.     Heart sounds: Normal heart sounds.  Pulmonary:     Effort: Pulmonary effort is normal.     Breath sounds: Normal breath sounds.  Abdominal:     General: Bowel sounds are normal.     Palpations: Abdomen is soft.  Musculoskeletal:        General: No tenderness or deformity. Normal range of motion.     Cervical back: Normal range of motion.  Lymphadenopathy:     Cervical: No cervical adenopathy.  Skin:    General: Skin is warm and dry.     Findings: No erythema or rash.  Neurological:     Mental Status: She is alert and oriented to person, place, and time.  Psychiatric:        Behavior: Behavior normal.        Thought Content: Thought content normal.        Judgment: Judgment normal.     Lab Results  Component Value Date   WBC 2.5 (L) 09/11/2024   HGB 4.9 (LL) 09/11/2024   HCT 14.3 (L) 09/11/2024   MCV 94.1 09/11/2024   PLT 6 (LL) 09/11/2024      Chemistry      Component Value Date/Time   NA 141 08/04/2024 1144   K 4.0 08/04/2024 1144   CL 104 08/04/2024 1144   CO2 24 08/04/2024 1144   BUN 32 (H)  08/04/2024 1144   CREATININE 1.69 (H) 08/04/2024 1144      Component Value Date/Time   CALCIUM  8.3 (L) 08/04/2024 1144   ALKPHOS 76 08/04/2024 1144   AST 17 08/04/2024 1144   ALT 12 08/04/2024 1144   BILITOT 0.6 08/04/2024 1144        Assessment and Plan-   Ms. Encina is a very nice 89 year old white female.  She has pancytopenia.  She finally had a bone marrow biopsy that was adequate.  It looks like she has a hypoplastic bone marrow.  She has erythroid hyperplasia.  I have to believe that this is a form of myelodysplasia.  Again, we are trying to get her bone marrow stimulated.  So far, we have had no success at all.  Again she will have to be admitted.  We will transfuse her in the hospital.  Hopefully, we will be able to transfuse her today and then be able to get her out tomorrow.  I am really not sure how much the Neulasta  or the Nplate  are helping.  Again I know this is incredibly challenging.  I know that she is quite mature.  I just want her to have a better quality of life.  So far, we have really not been able to provide that for her.  I just want her to be safe and I think transfusing her in the hospital is the best way to help her so that she does not have complications.    Maude JONELLE Crease, MD 06/10/2024

## 2024-09-11 NOTE — H&P (Signed)
" °  The note originally documented on this encounter has been moved the the encounter in which it belongs.  "

## 2024-09-11 NOTE — Plan of Care (Signed)
 ?  Problem: Clinical Measurements: ?Goal: Will remain free from infection ?Outcome: Progressing ?  ?

## 2024-09-12 DIAGNOSIS — D469 Myelodysplastic syndrome, unspecified: Principal | ICD-10-CM

## 2024-09-12 NOTE — Discharge Summary (Signed)
 Physician Discharge Summary  Patient ID: Doris Mcdaniel @ATTENDINGNPI @ MRN: [id number] DOB/AGE: [birth year] 87 y.o.  Admit date: 09/11/2024 Discharge date: 09/12/2024  Admission Diagnoses: Severe pancytopenia secondary to myelodysplasia  Discharge Diagnoses:  Principal Problem:   Myelodysplastic syndrome (HCC)   Discharged Condition: stable  Discharge Labs:   Significant Diagnostic Studies: labs: See labs  Consults: None  Disposition: Discharge disposition: 01-Home or Self Care       Treatments: Blood transfusion and platelet transfusion  Allergies as of 09/12/2024   No Known Allergies      Medication List     TAKE these medications    acetaminophen  500 MG tablet Commonly known as: TYLENOL  Take 2 tablets (1,000 mg total) by mouth 2 (two) times daily as needed for mild pain or moderate pain.   alendronate  70 MG tablet Commonly known as: FOSAMAX  Take 1 tablet (70 mg total) by mouth once a week. Take with full glass of water on empty stomach   atorvastatin  20 MG tablet Commonly known as: LIPITOR Take 1 tablet (20 mg total) by mouth at bedtime.   CALCIUM -VITAMIN D PO Take by mouth.   carboxymethylcellulose 0.5 % Soln Commonly known as: REFRESH PLUS Place 1 drop into both eyes daily as needed.   hydrochlorothiazide  25 MG tablet Commonly known as: HYDRODIURIL  Take 1 tablet (25 mg total) by mouth daily.   lisinopril  20 MG tablet Commonly known as: ZESTRIL  Take 1 tablet (20 mg total) by mouth daily.   loratadine  10 MG tablet Commonly known as: CLARITIN  Take 1 tablet (10 mg total) by mouth daily.   metoprolol  succinate 25 MG 24 hr tablet Commonly known as: TOPROL -XL Take 0.5 tablets (12.5 mg total) by mouth daily.   multivitamin capsule Take 1 capsule by mouth daily.   ondansetron  8 MG tablet Commonly known as: Zofran  Take 1 tablet (8 mg total) by mouth every 8 (eight) hours as needed for nausea or vomiting.   PRESERVISION AREDS PO Take by  mouth.   verapamil  240 MG CR tablet Commonly known as: CALAN -SR Take 1 tablet (240 mg total) by mouth at bedtime.         Follow-up Information     Timmy Maude SAUNDERS, MD Follow up in 3 day(s).   Specialty: Oncology Why: We will call you with the appointment. Contact information: [address] [place] [address] [phone]                 Hospital Course: Ms. Doris Mcdaniel was admitted on 09/11/2024.  She has severe pancytopenia.  Because of the weather, we need to transfuse her as an inpatient.  She was admitted to 3 [facility].  She had 2 units of packed red blood cells.  She had 2 units of platelets.  She tolerated this well.  She felt better after the transfusion.  She felt more energetic.  She had no fever.  She had no chills.  She had no nausea or vomiting.  We was able able to let her go home.  Discharge Exam: Blood pressure (!) 147/56, pulse 64, temperature 97.9 F (36.6 C), temperature source Oral, resp. rate 18, height 5' 6 (1.676 m), weight 186 lb 11.7 oz (84.7 kg), SpO2 100%. General appearance: alert and cooperative her head and neck exam shows no ocular or oral lesions.  Her conjunctiva are little bit more pink.  There is no oral lesions.  Her lungs were clear bilaterally.  She had good air movement bilaterally.  Cardiac exam regular  rate and rhythm.  She had no murmurs, rubs or bruits.  Abdomen is soft.  Bowel sounds are present.  She has no fluid wave.  There is no palpable liver or spleen tip.  Extremity shows no clubbing, cyanosis or edema.  She had good strength in upper and lower extremities.  Her neurological exam was nonfocal.  Skin exam showed some scattered ecchymoses.  Discharge Instructions     Discharge patient   Complete by: As directed    Please take out IV in LEFT arm.  Thanks!1  Pete   Discharge disposition: 01-Home or Self Care   Discharge patient date: 09/12/2024       Signed: Maude JONELLE Crease 09/12/2024, 7:22 AM

## 2024-09-12 NOTE — Progress Notes (Signed)
 Phlebotomist at bedside to draw am labs. This clinical research associate asked if the labs can be drawn at 0600 due to patient receiving blood products less than 2 hours ago. Someone from lab to come back to the floor to draw labs. Plan of care ongoing.

## 2024-09-14 ENCOUNTER — Inpatient Hospital Stay

## 2024-09-14 ENCOUNTER — Inpatient Hospital Stay: Admitting: Hematology & Oncology

## 2024-09-14 LAB — BPAM PLATELET PHERESIS
Blood Product Expiration Date: 202602022359
Blood Product Expiration Date: 202602032359
ISSUE DATE / TIME: 202601310236
ISSUE DATE / TIME: 202601310014
Unit Type and Rh: 6200
Unit Type and Rh: 202602022359
Unit Type and Rh: 6200

## 2024-09-14 LAB — PREPARE PLATELET PHERESIS
Unit division: 0
Unit division: 0

## 2024-09-14 LAB — TYPE AND SCREEN
ABO/RH(D): O NEG
Antibody Screen: NEGATIVE
Unit division: 0
Unit division: 0

## 2024-09-14 LAB — BPAM RBC
Blood Product Expiration Date: 202602212359
Blood Product Expiration Date: 202602212359
ISSUE DATE / TIME: 202601301806
ISSUE DATE / TIME: 202601302111
Unit Type and Rh: 9500
Unit Type and Rh: 9500

## 2024-09-15 ENCOUNTER — Telehealth: Payer: Self-pay | Admitting: Hematology & Oncology

## 2024-09-15 NOTE — Telephone Encounter (Signed)
 Called to schedule appts per inbasket. LVM to return call for scheduling.

## 2024-09-16 ENCOUNTER — Inpatient Hospital Stay: Admitting: Hematology & Oncology

## 2024-09-16 ENCOUNTER — Inpatient Hospital Stay

## 2024-09-16 ENCOUNTER — Other Ambulatory Visit: Payer: Self-pay | Admitting: *Deleted

## 2024-09-16 ENCOUNTER — Other Ambulatory Visit: Payer: Self-pay

## 2024-09-16 ENCOUNTER — Inpatient Hospital Stay: Attending: Medical Oncology

## 2024-09-16 ENCOUNTER — Telehealth: Payer: Self-pay

## 2024-09-16 VITALS — BP 141/42 | HR 56 | Temp 97.8°F | Resp 18 | Wt 184.1 lb

## 2024-09-16 DIAGNOSIS — L089 Local infection of the skin and subcutaneous tissue, unspecified: Secondary | ICD-10-CM

## 2024-09-16 DIAGNOSIS — D649 Anemia, unspecified: Secondary | ICD-10-CM

## 2024-09-16 DIAGNOSIS — D631 Anemia in chronic kidney disease: Secondary | ICD-10-CM

## 2024-09-16 DIAGNOSIS — D75839 Thrombocytosis, unspecified: Secondary | ICD-10-CM

## 2024-09-16 DIAGNOSIS — D696 Thrombocytopenia, unspecified: Secondary | ICD-10-CM

## 2024-09-16 DIAGNOSIS — D4620 Refractory anemia with excess of blasts, unspecified: Secondary | ICD-10-CM

## 2024-09-16 DIAGNOSIS — R7689 Other specified abnormal immunological findings in serum: Secondary | ICD-10-CM

## 2024-09-16 DIAGNOSIS — D539 Nutritional anemia, unspecified: Secondary | ICD-10-CM

## 2024-09-16 DIAGNOSIS — D469 Myelodysplastic syndrome, unspecified: Secondary | ICD-10-CM | POA: Diagnosis not present

## 2024-09-16 DIAGNOSIS — D61818 Other pancytopenia: Secondary | ICD-10-CM | POA: Diagnosis not present

## 2024-09-16 DIAGNOSIS — D72829 Elevated white blood cell count, unspecified: Secondary | ICD-10-CM

## 2024-09-16 LAB — CBC WITH DIFFERENTIAL (CANCER CENTER ONLY)
Abs Immature Granulocytes: 0.01 10*3/uL (ref 0.00–0.07)
Basophils Absolute: 0 10*3/uL (ref 0.0–0.1)
Basophils Relative: 0 %
Eosinophils Absolute: 0 10*3/uL (ref 0.0–0.5)
Eosinophils Relative: 2 %
HCT: 19.8 % — ABNORMAL LOW (ref 36.0–46.0)
Hemoglobin: 6.6 g/dL — CL (ref 12.0–15.0)
Immature Granulocytes: 1 %
Lymphocytes Relative: 66 %
Lymphs Abs: 0.9 10*3/uL (ref 0.7–4.0)
MCH: 30.4 pg (ref 26.0–34.0)
MCHC: 33.3 g/dL (ref 30.0–36.0)
MCV: 91.2 fL (ref 80.0–100.0)
Monocytes Absolute: 0 10*3/uL — ABNORMAL LOW (ref 0.1–1.0)
Monocytes Relative: 3 %
Neutro Abs: 0.4 10*3/uL — CL (ref 1.7–7.7)
Neutrophils Relative %: 28 %
Platelet Count: 10 10*3/uL — ABNORMAL LOW (ref 150–400)
RBC: 2.17 MIL/uL — ABNORMAL LOW (ref 3.87–5.11)
RDW: 19.5 % — ABNORMAL HIGH (ref 11.5–15.5)
WBC Count: 1.3 10*3/uL — ABNORMAL LOW (ref 4.0–10.5)
nRBC: 0 % (ref 0.0–0.2)

## 2024-09-16 LAB — PREPARE RBC (CROSSMATCH)

## 2024-09-16 LAB — RETICULOCYTES
Immature Retic Fract: 12.5 % (ref 2.3–15.9)
RBC.: 2.16 MIL/uL — ABNORMAL LOW (ref 3.87–5.11)
Retic Count, Absolute: 11.9 10*3/uL — ABNORMAL LOW (ref 19.0–186.0)
Retic Ct Pct: 0.6 % (ref 0.4–3.1)

## 2024-09-16 LAB — SAMPLE TO BLOOD BANK

## 2024-09-16 LAB — LACTATE DEHYDROGENASE: LDH: 106 U/L (ref 105–235)

## 2024-09-16 MED ORDER — SODIUM CHLORIDE 0.9% IV SOLUTION
250.0000 mL | INTRAVENOUS | Status: DC
  Administered 2024-09-16: 250 mL via INTRAVENOUS

## 2024-09-16 NOTE — Telephone Encounter (Signed)
 Received critical lab results from [facility] in ED lab stating that pt has a Hgb of 6.6, Hematocrit of 19.8, platelet 10, WBC 1.3 and abs neutrophils 0.4. Dr Timmy aware and ordered 1 unit of platelets today and 2 units of blood for tomorrow.

## 2024-09-16 NOTE — Patient Instructions (Signed)
 Getting Platelets Through an IV (Platelet Transfusion) in Adults: What to Expect A platelet transfusion is when a person gets platelets from another person (donor) through an IV. Platelets are parts of blood that stick together and form a clot to help stop bleeding after an injury. If you don't have enough platelets, you might bleed or bruise easily. You may need a platelet transfusion if you have a health problem that causes a low number of platelets, such as thrombocytopenia. A platelet transfusion may be used to stop or prevent too much bleeding. Tell a health care provider about: Any reactions you've had during past transfusions. Any allergies you have. All medicines you take. These include vitamins, herbs, eye drops, and creams. Any bleeding problems you have. Any surgeries you've had. Any health problems you have. Whether you're pregnant or may be pregnant. What are the risks? Your health care provider will talk with you about risks. These may include: Fever or chills. This might happen if your body reacts to the new cells. It can happen during or up to 4 hours after the transfusion. A mild allergy. You might get red, swollen areas on your skin (hives). You might feel itchy. A bad allergy. You might have trouble breathing or swelling around your face and lips. Very bad reactions are rare but can happen. These may be: Infection. This is rare because donated blood is carefully tested. Hemolytic reaction. This is when the defense system (immune system) of your body attacks the new cells. Symptoms include fever, chills, and a feeling that you may throw up. You may also have low blood pressure and pain in your chest or lower back. Transfusion-associated circulatory overload (TACO). This happens if fluids move to your lungs. This may cause breathing problems. Transfusion-related acute lung injury (TRALI). TRALI can cause breathing problems and low oxygen in your blood. This can happen within  hours or days after the transfusion. Transfusion-associated graft-versus-host disease (TAGVHD). This happens when donated cells attack the body's healthy tissues. What happens before? Take your medicines only as told. You will have a blood test to find out your blood type. This helps match the donor blood with your blood. If you've had an allergy to a transfusion in the past, you may be given medicine to help prevent another allergy. Your temperature, blood pressure, pulse, and breathing will be checked. What happens during platelet transfusion?  An IV will be put into a vein in your hand or arm. For your safety, two health care team members will check your identity and the donor platelets. The bag of donor platelets will be connected to your IV. The platelets will flow into your blood. This usually takes 30-60 minutes. Your temperature, blood pressure, pulse, and breathing will be checked. This helps catch signs of a reaction early. You will be watched for symptoms of all types of reactions, like chills, hives, or itching. If you show signs of a reaction, the transfusion will be stopped. You may be given medicine to help treat the reaction. When your transfusion is done, your IV will be removed. Pressure may be put on the IV site for a few minutes to stop any bleeding. The IV site will be covered with a bandage. These steps may vary. Ask what you can expect. What happens after? You will be watched closely until you leave. This includes checking your blood pressure, temperature, pulse rate, and breathing rate again. This information is not intended to replace advice given to you by your health care  provider. Make sure you discuss any questions you have with your health care provider. Document Revised: 11/15/2023 Document Reviewed: 11/15/2023 Elsevier Patient Education  2025 [employer].

## 2024-09-16 NOTE — Progress Notes (Unsigned)
 This Hematology and Oncology Follow Up Visit  Doris Mcdaniel [id number] 1936/06/29 89 y.o. 09/16/2024    Principle Diagnosis:  Hypoplastic myelodysplasia - TET2/ANKRD2   Current Therapy:   Imetelstat   7.1 mg/kg IV q month - start cycle #1 on 08/11/2024 Retacrit  40,000 units subcu weekly Neulasta  6 mg SQ q month Nplate  2 mg/Kg SQ q wk    Interim History:  Doris Mcdaniel is back for follow-up.  Unfortunately, I just do not think that we are getting any kind of response with the Imetestat.  Her blood counts are worse have seen.  Her hemoglobin is 4.9.  Her platelet count is 6000.  I talked to her at length.  I am just not sure if we will going to make any kind of impact with the  Imetelstat .  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  Unfortunately, we have to do a transfusion on her.  With the snowstorm coming, we just are not can be able to do it as an outpatient..  I talked her at length about the importance of getting her transfused.  This will certainly take stress off her heart..  I told her that we would admit her and do the transfusion as an inpatient.  She can then go home the next day.  I told her that the importance of being transfused.  With her platelet count still low, she is at a significant risk for bleeding.  The bleeding could be catastrophic.  I know that she does not feel confident about going to the hospital.  She just does not like having to be in the hospital.  However, I told her that we would do our best to make sure that she is in less than 1 day.  After long discussion, she agreed to be admitted.    I really do feel bad.  I just hate that were not making any impact on her blood counts.  I know this is quite disheartening.  She asked me how long I thought that she had the liver.  I told her that if we cannot get her blood counts improved, I would be quite surprised if she  made more than 3 months.  I am not surprised that she understood this.  What I have to talk to her about end-of-life issues I think.  I can so I do this while she is in the hospital.  I am sure that she would not want to be kept alive artificially.  I think that if she were to be put on life support, she would never come off.  At the present time, I would say that her performance status is by ECOG 2.    Wt Readings from Last 3 Encounters:  09/16/24 184 lb 1.3 oz (83.5 kg)  09/11/24 186 lb 11.7 oz (84.7 kg)  09/11/24 186 lb 12.8 oz (84.7 kg)     Medications:   Current Outpatient Medications:    acetaminophen  (TYLENOL ) 500 MG tablet, Take 2 tablets (1,000 mg total) by mouth 2 (two) times daily as needed for mild pain or moderate pain., Disp: 30 tablet, Rfl: 0   alendronate  (FOSAMAX ) 70 MG tablet, Take 1 tablet (70 mg total) by mouth once a week. Take with full glass of water on empty stomach, Disp: 12 tablet, Rfl: 3   atorvastatin  (LIPITOR) 20 MG tablet, Take 1 tablet (20 mg total) by mouth  at bedtime., Disp: 90 tablet, Rfl: 1   CALCIUM -VITAMIN D PO, Take by mouth., Disp: , Rfl:    carboxymethylcellulose (REFRESH PLUS) 0.5 % SOLN, Place 1 drop into both eyes daily as needed., Disp: , Rfl:    hydrochlorothiazide  (HYDRODIURIL ) 25 MG tablet, Take 1 tablet (25 mg total) by mouth daily., Disp: 90 tablet, Rfl: 1   lisinopril  (ZESTRIL ) 20 MG tablet, Take 1 tablet (20 mg total) by mouth daily., Disp: 30 tablet, Rfl: 1   loratadine  (CLARITIN ) 10 MG tablet, Take 1 tablet (10 mg total) by mouth daily., Disp: 90 tablet, Rfl: 3   metoprolol  succinate (TOPROL -XL) 25 MG 24 hr tablet, Take 0.5 tablets (12.5 mg total) by mouth daily., Disp: 30 tablet, Rfl: 1   Multiple Vitamin (MULTIVITAMIN) capsule, Take 1 capsule by mouth daily., Disp: , Rfl:    Multiple Vitamins-Minerals (PRESERVISION AREDS PO), Take by mouth., Disp: , Rfl:    ondansetron  (ZOFRAN ) 8 MG tablet, Take 1 tablet (8 mg total) by  mouth every 8 (eight) hours as needed for nausea or vomiting., Disp: 30 tablet, Rfl: 1   verapamil  (CALAN -SR) 240 MG CR tablet, Take 1 tablet (240 mg total) by mouth at bedtime., Disp: 90 tablet, Rfl: 1  Allergies: No Known Allergies  Past Medical History, Surgical history, Social history, and Family History were reviewed and updated.  Review of Systems: Review of Systems  Constitutional: Negative.   HENT:  Negative.    Eyes: Negative.   Respiratory: Negative.    Cardiovascular: Negative.   Gastrointestinal: Negative.   Endocrine: Negative.   Genitourinary: Negative.    Musculoskeletal: Negative.   Skin: Negative.   Neurological: Negative.   Hematological: Negative.   Psychiatric/Behavioral: Negative.     Physical Exam:  Vital signs show temperature 97.7.  Pulse 85.  Blood pressure 136/30.  Weight is 186 pounds.   Physical Exam Vitals reviewed.  HENT:     Head: Normocephalic and atraumatic.  Eyes:     Pupils: Pupils are equal, round, and reactive to light.  Cardiovascular:     Rate and Rhythm: Normal rate and regular rhythm.     Heart sounds: Normal heart sounds.  Pulmonary:     Effort: Pulmonary effort is normal.     Breath sounds: Normal breath sounds.  Abdominal:     General: Bowel sounds are normal.     Palpations: Abdomen is soft.  Musculoskeletal:        General: No tenderness or deformity. Normal range of motion.     Cervical back: Normal range of motion.  Lymphadenopathy:     Cervical: No cervical adenopathy.  Skin:    General: Skin is warm and dry.     Findings: No erythema or rash.  Neurological:     Mental Status: She is alert and oriented to person, place, and time.  Psychiatric:        Behavior: Behavior normal.        Thought Content: Thought content normal.        Judgment: Judgment normal.     Lab Results  Component Value Date   WBC 2.5 (L) 09/11/2024   HGB 4.9 (LL) 09/11/2024   HCT 14.3 (L) 09/11/2024   MCV 94.1 09/11/2024   PLT 6  (LL) 09/11/2024     Chemistry      Component Value Date/Time   NA 142 09/11/2024 1220   K 3.6 09/11/2024 1220   CL 105 09/11/2024 1220   CO2 21 (L) 09/11/2024 1220  BUN 28 (H) 09/11/2024 1220   CREATININE 1.51 (H) 09/11/2024 1220      Component Value Date/Time   CALCIUM  8.5 (L) 09/11/2024 1220   ALKPHOS 80 09/11/2024 1220   AST 17 09/11/2024 1220   ALT 11 09/11/2024 1220   BILITOT 0.6 09/11/2024 1220        Assessment and Plan-   Ms. Nobles is a very nice 89 year old white female.  She has pancytopenia.  She finally had a bone marrow biopsy that was adequate.  It looks like she has a hypoplastic bone marrow.  She has erythroid hyperplasia.  I have to believe that this is a form of myelodysplasia.  Again, we are trying to get her bone marrow stimulated.  So far, we have had no success at all.  Again she will have to be admitted.  We will transfuse her in the hospital.  Hopefully, we will be able to transfuse her today and then be able to get her out tomorrow.  I am really not sure how much the Neulasta  or the Nplate  are helping.  Again I know this is incredibly challenging.  I know that she is quite mature.  I just want her to have a better quality of life.  So far, we have really not been able to provide that for her.  I just want her to be safe and I think transfusing her in the hospital is the best way to help her so that she does not have complications.    Maude JONELLE Crease, MD 06/10/2024

## 2024-09-17 ENCOUNTER — Inpatient Hospital Stay

## 2024-09-17 ENCOUNTER — Encounter: Payer: Self-pay | Admitting: Hematology & Oncology

## 2024-09-17 ENCOUNTER — Telehealth: Payer: Self-pay | Admitting: *Deleted

## 2024-09-17 DIAGNOSIS — D631 Anemia in chronic kidney disease: Secondary | ICD-10-CM

## 2024-09-17 DIAGNOSIS — D61818 Other pancytopenia: Secondary | ICD-10-CM

## 2024-09-17 DIAGNOSIS — D649 Anemia, unspecified: Secondary | ICD-10-CM

## 2024-09-17 DIAGNOSIS — D72829 Elevated white blood cell count, unspecified: Secondary | ICD-10-CM

## 2024-09-17 DIAGNOSIS — D696 Thrombocytopenia, unspecified: Secondary | ICD-10-CM

## 2024-09-17 DIAGNOSIS — D75839 Thrombocytosis, unspecified: Secondary | ICD-10-CM

## 2024-09-17 DIAGNOSIS — L089 Local infection of the skin and subcutaneous tissue, unspecified: Secondary | ICD-10-CM

## 2024-09-17 DIAGNOSIS — D539 Nutritional anemia, unspecified: Secondary | ICD-10-CM

## 2024-09-17 DIAGNOSIS — D4620 Refractory anemia with excess of blasts, unspecified: Secondary | ICD-10-CM

## 2024-09-17 DIAGNOSIS — R7689 Other specified abnormal immunological findings in serum: Secondary | ICD-10-CM

## 2024-09-17 LAB — CBC WITH DIFFERENTIAL (CANCER CENTER ONLY)
Abs Immature Granulocytes: 0.01 10*3/uL (ref 0.00–0.07)
Basophils Absolute: 0 10*3/uL (ref 0.0–0.1)
Basophils Relative: 0 %
Eosinophils Absolute: 0 10*3/uL (ref 0.0–0.5)
Eosinophils Relative: 3 %
HCT: 19.3 % — ABNORMAL LOW (ref 36.0–46.0)
Hemoglobin: 6.4 g/dL — CL (ref 12.0–15.0)
Immature Granulocytes: 1 %
Lymphocytes Relative: 72 %
Lymphs Abs: 1 10*3/uL (ref 0.7–4.0)
MCH: 30 pg (ref 26.0–34.0)
MCHC: 33.2 g/dL (ref 30.0–36.0)
MCV: 90.6 fL (ref 80.0–100.0)
Monocytes Absolute: 0 10*3/uL — ABNORMAL LOW (ref 0.1–1.0)
Monocytes Relative: 2 %
Neutro Abs: 0.3 10*3/uL — CL (ref 1.7–7.7)
Neutrophils Relative %: 22 %
Platelet Count: 29 10*3/uL — ABNORMAL LOW (ref 150–400)
RBC: 2.13 MIL/uL — ABNORMAL LOW (ref 3.87–5.11)
RDW: 19.8 % — ABNORMAL HIGH (ref 11.5–15.5)
Smear Review: NORMAL
WBC Count: 1.4 10*3/uL — ABNORMAL LOW (ref 4.0–10.5)
nRBC: 0 % (ref 0.0–0.2)

## 2024-09-17 LAB — BPAM PLATELET PHERESIS
Blood Product Expiration Date: 202602052359
ISSUE DATE / TIME: 202602041259
Unit Type and Rh: 5100

## 2024-09-17 LAB — PREPARE PLATELET PHERESIS: Unit division: 0

## 2024-09-17 MED ORDER — SODIUM CHLORIDE 0.9% IV SOLUTION
250.0000 mL | INTRAVENOUS | Status: DC
  Administered 2024-09-17: 250 mL via INTRAVENOUS

## 2024-09-17 MED ORDER — DIPHENHYDRAMINE HCL 25 MG PO CAPS: 25.0000 mg | ORAL_CAPSULE | Freq: Once | ORAL | Status: DC

## 2024-09-17 MED ORDER — FUROSEMIDE 10 MG/ML IJ SOLN: 20.0000 mg | Freq: Once | INTRAMUSCULAR | Status: DC

## 2024-09-17 MED ORDER — ACETAMINOPHEN 325 MG PO TABS: 650.0000 mg | ORAL_TABLET | Freq: Once | ORAL | Status: DC

## 2024-09-17 NOTE — Telephone Encounter (Signed)
 Dr. Timmy notified of HGB-6.4, ANC-0.3 and platelets of 29.  No new orders received at this time.

## 2024-09-17 NOTE — Patient Instructions (Signed)
 Getting Blood Through an IV (Blood Transfusion) in Adults: What to Know After After a blood transfusion, it is common to have: Bruising and soreness at the IV site. A headache. Follow these instructions at home: Your doctor may give you more instructions. If you have problems, contact your doctor. Insertion site care     Follow instructions from your doctor about how to take care of your insertion site. This is where an IV tube was put into your vein. Make sure you: Wash your hands with soap and water  for at least 20 seconds before and after you change your bandage. If you cannot use soap and water , use hand sanitizer. Change your bandage as told by your doctor. Check your insertion site every day for signs of infection. Check for: Redness, swelling, or pain. Bleeding from the site. Warmth. Pus or a bad smell. General instructions Take over-the-counter and prescription medicines only as told by your doctor. Rest as told by your doctor. Go back to your normal activities as told by your doctor. Keep all follow-up visits. You may need to have tests at certain times to check your blood. Contact a doctor if: You have itching or red, swollen areas of skin (hives). You have a fever or chills. You have pain in the head, back, or chest. You feel worried or nervous (anxious). You feel weak after doing your normal activities. You have any of these problems at the insertion site: Redness, swelling, warmth, or pain. Bleeding that does not stop with pressure. Pus or a bad smell. If you received your blood transfusion in an outpatient setting, you will be told whom to contact to report any reactions. Get help right away if: You have signs of a serious reaction. This may be coming from an allergy or the body's defense system (immune system). Signs include: Trouble breathing or shortness of breath. Swelling of the face or feeling warm (flushed). A widespread rash. Dark pee (urine) or blood in  the pee. Fast heartbeat. These symptoms may be an emergency. Get help right away. Call 911. Do not wait to see if the symptoms will go away. Do not drive yourself to the hospital. Summary Bruising and soreness at the IV site are common. Check your insertion site every day for signs of infection. Rest as told by your doctor. Go back to your normal activities as told by your doctor. Get help right away if you have signs of a serious reaction. This information is not intended to replace advice given to you by your health care provider. Make sure you discuss any questions you have with your health care provider. Document Revised: 06/04/2024 Document Reviewed: 10/27/2021 Elsevier Patient Education  2025 [employer].

## 2024-09-17 NOTE — Progress Notes (Signed)
 Platelet count 29. Reviewed with Dr. Timmy. Patient to receive 2 units PRBC as scheduled and no additional platelets. Patient verbalized understanding.

## 2024-09-18 LAB — BPAM RBC
Blood Product Expiration Date: 202603072359
Blood Product Expiration Date: 202602212359
ISSUE DATE / TIME: 202602050742
ISSUE DATE / TIME: 202602050742
Unit Type and Rh: 202603072359
Unit Type and Rh: 9500
Unit Type and Rh: 9500

## 2024-09-18 LAB — TYPE AND SCREEN
ABO/RH(D): O NEG
Antibody Screen: NEGATIVE
Unit division: 0
Unit division: 0

## 2024-09-23 ENCOUNTER — Inpatient Hospital Stay

## 2024-09-30 ENCOUNTER — Inpatient Hospital Stay

## 2024-10-07 ENCOUNTER — Inpatient Hospital Stay

## 2024-10-12 ENCOUNTER — Inpatient Hospital Stay: Attending: Medical Oncology

## 2024-10-12 ENCOUNTER — Inpatient Hospital Stay

## 2024-10-12 ENCOUNTER — Inpatient Hospital Stay: Admitting: Hematology & Oncology

## 2024-11-02 ENCOUNTER — Ambulatory Visit: Admitting: Internal Medicine

## 2024-11-09 ENCOUNTER — Inpatient Hospital Stay

## 2024-11-09 ENCOUNTER — Inpatient Hospital Stay: Admitting: Hematology & Oncology

## 2024-12-07 ENCOUNTER — Inpatient Hospital Stay

## 2024-12-07 ENCOUNTER — Inpatient Hospital Stay: Admitting: Hematology & Oncology

## 2025-03-23 ENCOUNTER — Ambulatory Visit
